# Patient Record
Sex: Female | Born: 1970 | Race: White | Hispanic: No | Smoking: Current some day smoker
Health system: Southern US, Community
[De-identification: ages and names within clinical notes are randomized; demographics above are authoritative.]

## PROBLEM LIST (undated history)

## (undated) DIAGNOSIS — Z803 Family history of malignant neoplasm of breast: Secondary | ICD-10-CM

## (undated) DIAGNOSIS — K219 Gastro-esophageal reflux disease without esophagitis: Secondary | ICD-10-CM

## (undated) DIAGNOSIS — F419 Anxiety disorder, unspecified: Secondary | ICD-10-CM

## (undated) DIAGNOSIS — Z8042 Family history of malignant neoplasm of prostate: Secondary | ICD-10-CM

## (undated) DIAGNOSIS — I10 Essential (primary) hypertension: Secondary | ICD-10-CM

## (undated) DIAGNOSIS — Z923 Personal history of irradiation: Secondary | ICD-10-CM

## (undated) DIAGNOSIS — Z8489 Family history of other specified conditions: Secondary | ICD-10-CM

## (undated) DIAGNOSIS — Z806 Family history of leukemia: Secondary | ICD-10-CM

## (undated) HISTORY — PX: LEEP: SHX91

## (undated) HISTORY — DX: Family history of malignant neoplasm of breast: Z80.3

## (undated) HISTORY — DX: Essential (primary) hypertension: I10

## (undated) HISTORY — DX: Family history of malignant neoplasm of prostate: Z80.42

## (undated) HISTORY — DX: Gastro-esophageal reflux disease without esophagitis: K21.9

## (undated) HISTORY — DX: Anxiety disorder, unspecified: F41.9

## (undated) HISTORY — DX: Family history of leukemia: Z80.6

---

## 2006-11-29 ENCOUNTER — Ambulatory Visit: Payer: Self-pay | Admitting: Family Medicine

## 2009-07-03 ENCOUNTER — Emergency Department (HOSPITAL_COMMUNITY): Admission: EM | Admit: 2009-07-03 | Discharge: 2009-07-03 | Payer: Self-pay | Admitting: Emergency Medicine

## 2012-04-19 LAB — HM MAMMOGRAPHY

## 2012-10-13 ENCOUNTER — Encounter (HOSPITAL_COMMUNITY): Payer: Self-pay

## 2012-10-13 ENCOUNTER — Emergency Department (HOSPITAL_COMMUNITY): Payer: Medicaid Other

## 2012-10-13 ENCOUNTER — Emergency Department (HOSPITAL_COMMUNITY)
Admission: EM | Admit: 2012-10-13 | Discharge: 2012-10-13 | Disposition: A | Discharge status: Home / Self Care | Payer: Medicaid Other | Attending: Emergency Medicine | Admitting: Emergency Medicine

## 2012-10-13 DIAGNOSIS — Z7982 Long term (current) use of aspirin: Secondary | ICD-10-CM | POA: Insufficient documentation

## 2012-10-13 DIAGNOSIS — Z79899 Other long term (current) drug therapy: Secondary | ICD-10-CM | POA: Insufficient documentation

## 2012-10-13 DIAGNOSIS — M545 Low back pain, unspecified: Secondary | ICD-10-CM | POA: Insufficient documentation

## 2012-10-13 DIAGNOSIS — F17200 Nicotine dependence, unspecified, uncomplicated: Secondary | ICD-10-CM | POA: Insufficient documentation

## 2012-10-13 MED ORDER — IBUPROFEN 800 MG PO TABS
800.0000 mg | ORAL_TABLET | Freq: Once | ORAL | Status: AC
  Administered 2012-10-13: 800 mg via ORAL
  Filled 2012-10-13: qty 1

## 2012-10-13 MED ORDER — HYDROCODONE-ACETAMINOPHEN 5-325 MG PO TABS: 1.0000 | ORAL_TABLET | Freq: Four times a day (QID) | ORAL | Status: AC | PRN

## 2012-10-13 MED ORDER — HYDROCODONE-ACETAMINOPHEN 5-325 MG PO TABS
1.0000 | ORAL_TABLET | Freq: Once | ORAL | Status: AC
  Administered 2012-10-13: 1 via ORAL
  Filled 2012-10-13: qty 1

## 2012-10-13 NOTE — ED Provider Notes (Signed)
Medical screening examination/treatment/procedure(s) were performed by non-physician practitioner and as supervising physician I was immediately available for consultation/collaboration.  Melrose Kearse M Gia Lusher, MD 10/13/12 2048 

## 2012-10-13 NOTE — ED Notes (Signed)
Pt presents with lower back pain that has worsened over past 3 days. Pt denies loss of bladder and bowel function. Pt denies injury at this time. NAD noted

## 2012-10-13 NOTE — ED Provider Notes (Signed)
History     CSN: [id number]  Arrival date & time 10/13/12  1151   First MD Initiated Contact with Patient 10/13/12 1228      Chief Complaint  Patient presents with  . Back Pain    (Consider location/radiation/quality/duration/timing/severity/associated sxs/prior treatment) HPI Comments: No known injury to back.  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  Patient is a 41 y.o. female presenting with back pain. The history is provided by the patient. No language interpreter was used.  Back Pain  This is a chronic problem. The problem occurs constantly. The problem has not changed since onset.The pain is associated with no known injury. The pain is present in the lumbar spine. The symptoms are aggravated by bending and twisting. The pain is the same all the time. Pertinent negatives include no fever, no numbness, no bowel incontinence, no perianal numbness, no bladder incontinence, no dysuria, no pelvic pain, no leg pain, no paresthesias, no paresis, no tingling and no weakness. She has tried muscle relaxants and NSAIDs for the symptoms. The treatment provided no relief.    History reviewed. No pertinent past medical history.  History reviewed. No pertinent past surgical history.  No family history on file.  History  Substance Use Topics  . Smoking status: Current Every Day Smoker  . Smokeless tobacco: Not on file  . Alcohol Use: No    OB History    Grav Para Term Preterm Abortions TAB SAB Ect Mult Living                  Review of Systems  Constitutional: Negative for fever.  Gastrointestinal: Negative for bowel incontinence.  Genitourinary: Negative for bladder incontinence, dysuria and pelvic pain.  Musculoskeletal: Positive for back pain.  Neurological: Negative for tingling, weakness, numbness and paresthesias.  All other systems reviewed and are negative.    Allergies  Review of patient's allergies indicates no known  allergies.  Home Medications   Current Outpatient Rx  Name  Route  Sig  Dispense  Refill  . ACETAMINOPHEN 500 MG PO TABS   Oral   Take 1,000 mg by mouth every 6 (six) hours as needed. pain         . ALPRAZOLAM 1 MG PO TABS   Oral   Take 1 mg by mouth 4 (four) times daily.         Marlin Canary HEADACHE PO   Oral   Take 1 packet by mouth every 12 (twelve) hours as needed. headache         . ENALAPRIL MALEATE 5 MG PO TABS   Oral   Take 5 mg by mouth daily.         . IBUPROFEN 200 MG PO TABS   Oral   Take 400 mg by mouth every 8 (eight) hours as needed. Back pain         . METHOCARBAMOL 500 MG PO TABS   Oral   Take 500 mg by mouth daily.         Marland Kitchen OMEPRAZOLE 20 MG PO CPDR   Oral   Take 20 mg by mouth daily.         Marland Kitchen PAROXETINE HCL 10 MG PO TABS   Oral   Take 10 mg by mouth every morning.         . CYCLOBENZAPRINE HCL 10 MG PO TABS   Oral   Take 10 mg by mouth 3 (three) times daily.  BP 105/71  Pulse 95  Temp 97.8 F (36.6 C) (Oral)  Resp 18  Ht 5\' 6"  (1.676 m)  Wt 140 lb (63.504 kg)  BMI 22.60 kg/m2  SpO2 98%  LMP 10/12/2012  Physical Exam  Nursing note and vitals reviewed. Constitutional: She is oriented to person, place, and time. She appears well-developed and well-nourished. No distress.  HENT:  Head: Normocephalic and atraumatic.  Eyes: EOM are normal.  Neck: Normal range of motion.  Cardiovascular: Normal rate, regular rhythm and normal heart sounds.   Pulmonary/Chest: Effort normal and breath sounds normal.  Abdominal: Soft. She exhibits no distension. There is no tenderness.  Musculoskeletal: She exhibits tenderness.       Lumbar back: She exhibits decreased range of motion, tenderness, bony tenderness and pain. She exhibits no deformity and normal pulse.       Back:  Neurological: She is alert and oriented to person, place, and time.  Skin: Skin is warm and dry.  Psychiatric: She has a normal mood and affect. Judgment  normal.    ED Course  Procedures (including critical care time)  Labs Reviewed - No data to display Dg Lumbar Spine Complete  10/13/2012  *RADIOLOGY REPORT*  Clinical Data: Low back pain.  LUMBAR SPINE - COMPLETE 4+ VIEW  Comparison: None.  Findings: There is narrowing of the L5-S1 disc space with sclerosis and osteophytes extending from the endplates.  The remainder of the lumbar spine appears normal.  No facet arthritis.  No fractures.  IMPRESSION: Degenerative disc disease at L5-S1.   Original Report Authenticated By: Francene Boyers, M.D.      1. Lumbar back pain       MDM  rx-hydrocodone, 20 Ice F/u with PCP        [name], PA 10/13/12 1437

## 2012-10-13 NOTE — ED Notes (Signed)
Pt c/o pain in lower back since Friday.  Denies any recent injury.  Reports chronic back pain.  Denies any pain radiating down her legs

## 2013-08-05 ENCOUNTER — Ambulatory Visit (INDEPENDENT_AMBULATORY_CARE_PROVIDER_SITE_OTHER): Payer: PRIVATE HEALTH INSURANCE | Admitting: Family Medicine

## 2013-08-05 ENCOUNTER — Encounter (INDEPENDENT_AMBULATORY_CARE_PROVIDER_SITE_OTHER): Payer: Self-pay

## 2013-08-05 VITALS — BP 114/79 | HR 82 | Temp 97.9°F | Ht 65.5 in | Wt 137.6 lb

## 2013-08-05 DIAGNOSIS — Z0000 Encounter for general adult medical examination without abnormal findings: Secondary | ICD-10-CM

## 2013-08-05 DIAGNOSIS — Z23 Encounter for immunization: Secondary | ICD-10-CM

## 2013-08-05 LAB — POCT CBC
Granulocyte percent: 68.9 %G (ref 37–80)
HCT, POC: 43.6 % (ref 37.7–47.9)
Hemoglobin: 14.3 g/dL (ref 12.2–16.2)
Lymph, poc: 1.7 (ref 0.6–3.4)
MCH, POC: 31.5 pg — AB (ref 27–31.2)
MCHC: 32.9 g/dL (ref 31.8–35.4)
MCV: 95.6 fL (ref 80–97)
MPV: 7.6 fL (ref 0–99.8)
POC Granulocyte: 4.3 (ref 2–6.9)
POC LYMPH PERCENT: 27.7 %L (ref 10–50)
Platelet Count, POC: 270 10*3/uL (ref 142–424)
RBC: 4.6 M/uL (ref 4.04–5.48)
RDW, POC: 13.1 %
WBC: 6.3 10*3/uL (ref 4.6–10.2)

## 2013-08-05 NOTE — Progress Notes (Signed)
  Subjective:    Patient ID: Doris Newman, female    DOB: [birth year], 42 y.o.   MRN: [id number]  HPI This 42 y.o. female presents for evaluation of exam.  She is seeing female Provider for women's health.  She has hx of smoking cigarettes.  She  Is doing fine today and denies any health problems.   Review of Systems No chest pain, SOB, HA, dizziness, vision change, N/V, diarrhea, constipation, dysuria, urinary urgency or frequency, myalgias, arthralgias or rash.     Objective:   Physical Exam Vital signs noted  Well developed well nourished Female.  HEENT - Head atraumatic Normocephalic                Eyes - PERRLA, Conjuctiva - clear Sclera- Clear EOMI                Ears - EAC's Wnl TM's Wnl Gross Hearing WNL                Nose - Nares patent                 Throat - oropharanx wnl Respiratory - Lungs CTA bilateral Cardiac - RRR S1 and S2 without murmur GI - Abdomen soft Nontender and bowel sounds active x 4 Extremities - No edema. Neuro - Grossly intact.       Assessment & Plan:  Routine general medical examination at a health care facility - Plan: POCT CBC, CMP14+EGFR, Lipid panel, Thyroid Panel With TSH, Vit D  25 hydroxy (rtn osteoporosis monitoring)  Tobacco abuse - Discussed DC smoking  Deatra Canter FNP f

## 2013-08-05 NOTE — Patient Instructions (Signed)
Smoking Cessation Quitting smoking is important to your health and has many advantages. However, it is not always easy to quit since nicotine is a very addictive drug. Often times, people try 3 times or more before being able to quit. This document explains the best ways for you to prepare to quit smoking. Quitting takes hard work and a lot of effort, but you can do it. ADVANTAGES OF QUITTING SMOKING  You will live longer, feel better, and live better.  Your body will feel the impact of quitting smoking almost immediately.  Within 20 minutes, blood pressure decreases. Your pulse returns to its normal level.  After 8 hours, carbon monoxide levels in the blood return to normal. Your oxygen level increases.  After 24 hours, the chance of having a heart attack starts to decrease. Your breath, hair, and body stop smelling like smoke.  After 48 hours, damaged nerve endings begin to recover. Your sense of taste and smell improve.  After 72 hours, the body is virtually free of nicotine. Your bronchial tubes relax and breathing becomes easier.  After 2 to 12 weeks, lungs can hold more air. Exercise becomes easier and circulation improves.  The risk of having a heart attack, stroke, cancer, or lung disease is greatly reduced.  After 1 year, the risk of coronary heart disease is cut in half.  After 5 years, the risk of stroke falls to the same as a nonsmoker.  After 10 years, the risk of lung cancer is cut in half and the risk of other cancers decreases significantly.  After 15 years, the risk of coronary heart disease drops, usually to the level of a nonsmoker.  If you are pregnant, quitting smoking will improve your chances of having a healthy baby.  The people you live with, especially any children, will be healthier.  You will have extra money to spend on things other than cigarettes. QUESTIONS TO THINK ABOUT BEFORE ATTEMPTING TO QUIT You may want to talk about your answers with your  caregiver.  Why do you want to quit?  If you tried to quit in the past, what helped and what did not?  What will be the most difficult situations for you after you quit? How will you plan to handle them?  Who can help you through the tough times? Your family? Friends? A caregiver?  What pleasures do you get from smoking? What ways can you still get pleasure if you quit? Here are some questions to ask your caregiver:  How can you help me to be successful at quitting?  What medicine do you think would be best for me and how should I take it?  What should I do if I need more help?  What is smoking withdrawal like? How can I get information on withdrawal? GET READY  Set a quit date.  Change your environment by getting rid of all cigarettes, ashtrays, matches, and lighters in your home, car, or work. Do not let people smoke in your home.  Review your past attempts to quit. Think about what worked and what did not. GET SUPPORT AND ENCOURAGEMENT You have a better chance of being successful if you have help. You can get support in many ways.  Tell your family, friends, and co-workers that you are going to quit and need their support. Ask them not to smoke around you.  Get individual, group, or telephone counseling and support. Programs are available at local hospitals and health centers. Call your local health department for   information about programs in your area.  Spiritual beliefs and practices may help some smokers quit.  Download a "quit meter" on your computer to keep track of quit statistics, such as how long you have gone without smoking, cigarettes not smoked, and money saved.  Get a self-help book about quitting smoking and staying off of tobacco. LEARN NEW SKILLS AND BEHAVIORS  Distract yourself from urges to smoke. Talk to someone, go for a walk, or occupy your time with a task.  Change your normal routine. Take a different route to work. Drink tea instead of coffee.  Eat breakfast in a different place.  Reduce your stress. Take a hot bath, exercise, or read a book.  Plan something enjoyable to do every day. Reward yourself for not smoking.  Explore interactive web-based programs that specialize in helping you quit. GET MEDICINE AND USE IT CORRECTLY Medicines can help you stop smoking and decrease the urge to smoke. Combining medicine with the above behavioral methods and support can greatly increase your chances of successfully quitting smoking.  Nicotine replacement therapy helps deliver nicotine to your body without the negative effects and risks of smoking. Nicotine replacement therapy includes nicotine gum, lozenges, inhalers, nasal sprays, and skin patches. Some may be available over-the-counter and others require a prescription.  Antidepressant medicine helps people abstain from smoking, but how this works is unknown. This medicine is available by prescription.  Nicotinic receptor partial agonist medicine simulates the effect of nicotine in your brain. This medicine is available by prescription. Ask your caregiver for advice about which medicines to use and how to use them based on your health history. Your caregiver will tell you what side effects to look out for if you choose to be on a medicine or therapy. Carefully read the information on the package. Do not use any other product containing nicotine while using a nicotine replacement product.  RELAPSE OR DIFFICULT SITUATIONS Most relapses occur within the first 3 months after quitting. Do not be discouraged if you start smoking again. Remember, most people try several times before finally quitting. You may have symptoms of withdrawal because your body is used to nicotine. You may crave cigarettes, be irritable, feel very hungry, cough often, get headaches, or have difficulty concentrating. The withdrawal symptoms are only temporary. They are strongest when you first quit, but they will go away within  10 14 days. To reduce the chances of relapse, try to:  Avoid drinking alcohol. Drinking lowers your chances of successfully quitting.  Reduce the amount of caffeine you consume. Once you quit smoking, the amount of caffeine in your body increases and can give you symptoms, such as a rapid heartbeat, sweating, and anxiety.  Avoid smokers because they can make you want to smoke.  Do not let weight gain distract you. Many smokers will gain weight when they quit, usually less than 10 pounds. Eat a healthy diet and stay active. You can always lose the weight gained after you quit.  Find ways to improve your mood other than smoking. FOR MORE INFORMATION  www.smokefree.gov  Document Released: 09/26/2001 Document Revised: 04/02/2012 Document Reviewed: 01/11/2012 Exit[employer] Patient Information 2014 [employer].  

## 2013-08-06 LAB — VITAMIN D 25 HYDROXY (VIT D DEFICIENCY, FRACTURES): Vit D, 25-Hydroxy: 42.1 ng/mL (ref 30.0–100.0)

## 2013-08-06 LAB — LIPID PANEL
Chol/HDL Ratio: 2.3 ratio units (ref 0.0–4.4)
Cholesterol, Total: 187 mg/dL (ref 100–199)
HDL: 80 mg/dL (ref >39)
LDL Calculated: 93 mg/dL (ref 0–99)
Triglycerides: 71 mg/dL (ref 0–149)
VLDL Cholesterol Cal: 14 mg/dL (ref 5–40)

## 2013-08-06 LAB — CMP14+EGFR
ALT: 14 IU/L (ref 0–32)
AST: 17 IU/L (ref 0–40)
Albumin/Globulin Ratio: 2.3 (ref 1.1–2.5)
Albumin: 4.5 g/dL (ref 3.5–5.5)
Alkaline Phosphatase: 62 IU/L (ref 39–117)
BUN/Creatinine Ratio: 15 (ref 9–23)
BUN: 14 mg/dL (ref 6–24)
CO2: 25 mmol/L (ref 18–29)
Calcium: 9.2 mg/dL (ref 8.7–10.2)
Chloride: 100 mmol/L (ref 97–108)
Creatinine, Ser: 0.95 mg/dL (ref 0.57–1.00)
GFR calc Af Amer: 85 mL/min/{1.73_m2} (ref >59)
GFR calc non Af Amer: 74 mL/min/{1.73_m2} (ref >59)
Globulin, Total: 2 g/dL (ref 1.5–4.5)
Glucose: 85 mg/dL (ref 65–99)
Potassium: 4.7 mmol/L (ref 3.5–5.2)
Sodium: 139 mmol/L (ref 134–144)
Total Bilirubin: 0.5 mg/dL (ref 0.0–1.2)
Total Protein: 6.5 g/dL (ref 6.0–8.5)

## 2013-08-06 LAB — THYROID PANEL WITH TSH
Free Thyroxine Index: 1.5 (ref 1.2–4.9)
T3 Uptake Ratio: 34 % (ref 24–39)
T4, Total: 4.3 ug/dL — ABNORMAL LOW (ref 4.5–12.0)
TSH: 0.943 u[IU]/mL (ref 0.450–4.500)

## 2014-08-21 ENCOUNTER — Telehealth: Payer: Self-pay | Admitting: Family Medicine

## 2014-08-24 NOTE — Telephone Encounter (Signed)
Appt chngd from Wed to Thurs due to provider coverage

## 2014-08-26 ENCOUNTER — Encounter: Payer: PRIVATE HEALTH INSURANCE | Admitting: Family Medicine

## 2014-08-27 ENCOUNTER — Ambulatory Visit (INDEPENDENT_AMBULATORY_CARE_PROVIDER_SITE_OTHER): Payer: 59 | Admitting: *Deleted

## 2014-08-27 ENCOUNTER — Encounter: Payer: Self-pay | Admitting: Family Medicine

## 2014-08-27 ENCOUNTER — Encounter (INDEPENDENT_AMBULATORY_CARE_PROVIDER_SITE_OTHER): Payer: Self-pay

## 2014-08-27 ENCOUNTER — Ambulatory Visit (INDEPENDENT_AMBULATORY_CARE_PROVIDER_SITE_OTHER): Payer: 59 | Admitting: Family Medicine

## 2014-08-27 VITALS — BP 130/86 | HR 87 | Temp 98.2°F | Ht 65.5 in | Wt 156.6 lb

## 2014-08-27 DIAGNOSIS — Z23 Encounter for immunization: Secondary | ICD-10-CM

## 2014-08-27 DIAGNOSIS — Z0000 Encounter for general adult medical examination without abnormal findings: Secondary | ICD-10-CM

## 2014-08-27 LAB — POCT CBC
Granulocyte percent: 66.1 %G (ref 37–80)
HCT, POC: 43.4 % (ref 37.7–47.9)
Hemoglobin: 14 g/dL (ref 12.2–16.2)
Lymph, poc: 1.7 (ref 0.6–3.4)
MCH, POC: 30.5 pg (ref 27–31.2)
MCHC: 32.2 g/dL (ref 31.8–35.4)
MCV: 94.8 fL (ref 80–97)
MPV: 7 fL (ref 0–99.8)
POC Granulocyte: 3.8 (ref 2–6.9)
POC LYMPH PERCENT: 29.8 %L (ref 10–50)
Platelet Count, POC: 274 10*3/uL (ref 142–424)
RBC: 4.6 M/uL (ref 4.04–5.48)
RDW, POC: 13.1 %
WBC: 5.8 10*3/uL (ref 4.6–10.2)

## 2014-08-27 NOTE — Progress Notes (Signed)
   Subjective:    Patient ID: Doris Newman, female    DOB: 1971/07/05, 43 y.o.   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  She has no acute complaints.    Review of Systems  Constitutional: Negative for fever.  HENT: Negative for ear pain.   Eyes: Negative for discharge.  Respiratory: Negative for cough.   Cardiovascular: Negative for chest pain.  Gastrointestinal: Negative for abdominal distention.  Endocrine: Negative for polyuria.  Genitourinary: Negative for difficulty urinating.  Musculoskeletal: Negative for gait problem and neck pain.  Skin: Negative for color change and rash.  Neurological: Negative for speech difficulty and headaches.  Psychiatric/Behavioral: Negative for agitation.       Objective:    There were no vitals taken for this visit. Physical Exam  Constitutional: She is oriented to person, place, and time. She appears well-developed and well-nourished.  HENT:  Head: Normocephalic and atraumatic.  Mouth/Throat: Oropharynx is clear and moist.  Eyes: Pupils are equal, round, and reactive to light.  Neck: Normal range of motion. Neck supple.  Cardiovascular: Normal rate and regular rhythm.   No murmur heard. Pulmonary/Chest: Effort normal and breath sounds normal.  Abdominal: Soft. Bowel sounds are normal. There is no tenderness.  Neurological: She is alert and oriented to person, place, and time.  Skin: Skin is warm and dry.  Psychiatric: She has a normal mood and affect.          Assessment & Plan:     ICD-9-CM ICD-10-CM   1. Routine general medical examination at a health care facility V70.0 Z00.00 POCT CBC     CMP14+EGFR     Lipid panel     Thyroid Panel With TSH     Return if symptoms worsen or fail to improve.  Lysbeth Penner FNP

## 2014-08-28 ENCOUNTER — Telehealth: Payer: Self-pay | Admitting: Family Medicine

## 2014-08-28 LAB — THYROID PANEL WITH TSH
Free Thyroxine Index: 1.5 (ref 1.2–4.9)
T3 Uptake Ratio: 30 % (ref 24–39)
T4, Total: 5 ug/dL (ref 4.5–12.0)
TSH: 0.947 u[IU]/mL (ref 0.450–4.500)

## 2014-08-28 LAB — CMP14+EGFR
ALT: 16 IU/L (ref 0–32)
AST: 22 IU/L (ref 0–40)
Albumin/Globulin Ratio: 2 (ref 1.1–2.5)
Albumin: 4.6 g/dL (ref 3.5–5.5)
Alkaline Phosphatase: 71 IU/L (ref 39–117)
BUN/Creatinine Ratio: 14 (ref 9–23)
BUN: 12 mg/dL (ref 6–24)
CO2: 21 mmol/L (ref 18–29)
Calcium: 9.3 mg/dL (ref 8.7–10.2)
Chloride: 95 mmol/L — ABNORMAL LOW (ref 97–108)
Creatinine, Ser: 0.86 mg/dL (ref 0.57–1.00)
GFR calc Af Amer: 96 mL/min/{1.73_m2} (ref >59)
GFR calc non Af Amer: 83 mL/min/{1.73_m2} (ref >59)
Globulin, Total: 2.3 g/dL (ref 1.5–4.5)
Glucose: 80 mg/dL (ref 65–99)
Potassium: 4.3 mmol/L (ref 3.5–5.2)
Sodium: 132 mmol/L — ABNORMAL LOW (ref 134–144)
Total Bilirubin: 0.4 mg/dL (ref 0.0–1.2)
Total Protein: 6.9 g/dL (ref 6.0–8.5)

## 2014-08-28 LAB — LIPID PANEL
Chol/HDL Ratio: 2.2 ratio units (ref 0.0–4.4)
Cholesterol, Total: 203 mg/dL — ABNORMAL HIGH (ref 100–199)
HDL: 94 mg/dL (ref >39)
LDL Calculated: 97 mg/dL (ref 0–99)
Triglycerides: 61 mg/dL (ref 0–149)
VLDL Cholesterol Cal: 12 mg/dL (ref 5–40)

## 2014-08-28 NOTE — Telephone Encounter (Signed)
-----   Message from Lysbeth Penner, FNP sent at 08/28/2014 10:36 AM EST ----- Labs ok

## 2015-10-25 ENCOUNTER — Encounter: Payer: Self-pay | Admitting: Family Medicine

## 2015-10-25 ENCOUNTER — Ambulatory Visit (INDEPENDENT_AMBULATORY_CARE_PROVIDER_SITE_OTHER): Payer: BLUE CROSS/BLUE SHIELD | Admitting: Family Medicine

## 2015-10-25 VITALS — BP 121/86 | HR 73 | Temp 98.8°F | Ht 65.5 in | Wt 163.6 lb

## 2015-10-25 DIAGNOSIS — J0100 Acute maxillary sinusitis, unspecified: Secondary | ICD-10-CM

## 2015-10-25 MED ORDER — AMOXICILLIN-POT CLAVULANATE 875-125 MG PO TABS: 1.0000 | ORAL_TABLET | Freq: Two times a day (BID) | ORAL | Status: DC

## 2015-10-25 NOTE — Progress Notes (Signed)
   Subjective:    Patient ID: Doris Newman, female    DOB: 02/02/1971, 45 y.o.   MRN: [id number]  HPI 45 year old female with chief complaint of head congestion drainage and lack of energy. The lack of energy may be acute as well as chronic. There is a history of depression and she currently takes citalopram 30 mg with the Wellbutrin having been stopped her psychiatrist. She was out of work yesterday and needs a note  There are no active problems to display for this patient.  Outpatient Encounter Prescriptions as of 10/25/2015  Medication Sig  . acetaminophen (TYLENOL) 500 MG tablet Take 1,000 mg by mouth every 6 (six) hours as needed. pain  . ALPRAZolam (XANAX) 1 MG tablet Take 1 mg by mouth 4 (four) times daily.  . citalopram (CELEXA) 20 MG tablet Take 20 mg by mouth daily.  . cyclobenzaprine (FLEXERIL) 10 MG tablet Take 10 mg by mouth 3 (three) times daily.  . enalapril (VASOTEC) 10 MG tablet   . ibuprofen (ADVIL,MOTRIN) 200 MG tablet Take 400 mg by mouth every 8 (eight) hours as needed. Back pain  . omeprazole (PRILOSEC) 20 MG capsule Take 20 mg by mouth daily.  . Aspirin-Acetaminophen-Caffeine (GOODY HEADACHE PO) Take 1 packet by mouth every 12 (twelve) hours as needed. headache  . [DISCONTINUED] buPROPion (WELLBUTRIN XL) 150 MG 24 hr tablet Take 300 mg by mouth daily.   . [DISCONTINUED] NORA-BE 0.35 MG tablet   . [DISCONTINUED] prazosin (MINIPRESS) 2 MG capsule Take 2 mg by mouth at bedtime.   No facility-administered encounter medications on file as of 10/25/2015.      Review of Systems  Constitutional: Negative.   HENT: Positive for congestion and postnasal drip.   Respiratory: Negative.   Cardiovascular: Negative.   Neurological: Negative.   Psychiatric/Behavioral: Negative.        Objective:   Physical Exam  Constitutional: She is oriented to person, place, and time. She appears well-developed and well-nourished.  HENT:  Head: Normocephalic.  Mouth/Throat:  Oropharynx is clear and moist.  Tenderness in maxillary sinuses to percussion  Cardiovascular: Normal rate and regular rhythm.   Pulmonary/Chest: Effort normal and breath sounds normal.  Neurological: She is alert and oriented to person, place, and time.  Psychiatric: She has a normal mood and affect. Her behavior is normal.          Assessment & Plan:  1. Acute maxillary sinusitis, recurrence not specified We'll treat with Augmentin and Mucinex D. Troponin fluids. Spend extra minutes and hot shower.  Wardell Honour MD

## 2015-12-06 ENCOUNTER — Other Ambulatory Visit (INDEPENDENT_AMBULATORY_CARE_PROVIDER_SITE_OTHER): Payer: BLUE CROSS/BLUE SHIELD

## 2015-12-06 DIAGNOSIS — R5382 Chronic fatigue, unspecified: Secondary | ICD-10-CM | POA: Diagnosis not present

## 2015-12-06 DIAGNOSIS — R635 Abnormal weight gain: Secondary | ICD-10-CM

## 2015-12-07 LAB — CMP14+EGFR
ALT: 15 IU/L (ref 0–32)
AST: 20 IU/L (ref 0–40)
Albumin/Globulin Ratio: 2.1 (ref 1.1–2.5)
Albumin: 4.5 g/dL (ref 3.5–5.5)
Alkaline Phosphatase: 65 IU/L (ref 39–117)
BUN/Creatinine Ratio: 13 (ref 9–23)
BUN: 11 mg/dL (ref 6–24)
Bilirubin Total: 0.6 mg/dL (ref 0.0–1.2)
CO2: 21 mmol/L (ref 18–29)
Calcium: 9.3 mg/dL (ref 8.7–10.2)
Chloride: 98 mmol/L (ref 96–106)
Creatinine, Ser: 0.85 mg/dL (ref 0.57–1.00)
GFR calc Af Amer: 96 mL/min/1.73 (ref >59)
GFR calc non Af Amer: 84 mL/min/1.73 (ref >59)
Globulin, Total: 2.1 g/dL (ref 1.5–4.5)
Glucose: 97 mg/dL (ref 65–99)
Potassium: 4.7 mmol/L (ref 3.5–5.2)
Sodium: 134 mmol/L (ref 134–144)
Total Protein: 6.6 g/dL (ref 6.0–8.5)

## 2015-12-07 LAB — CBC WITH DIFFERENTIAL/PLATELET
Basophils Absolute: 0 x10E3/uL (ref 0.0–0.2)
Basos: 0 %
EOS (ABSOLUTE): 0.2 x10E3/uL (ref 0.0–0.4)
Eos: 4 %
Hematocrit: 40.1 % (ref 34.0–46.6)
Hemoglobin: 13.8 g/dL (ref 11.1–15.9)
Immature Grans (Abs): 0 x10E3/uL (ref 0.0–0.1)
Immature Granulocytes: 0 %
Lymphocytes Absolute: 1.5 x10E3/uL (ref 0.7–3.1)
Lymphs: 29 %
MCH: 32.4 pg (ref 26.6–33.0)
MCHC: 34.4 g/dL (ref 31.5–35.7)
MCV: 94 fL (ref 79–97)
Monocytes Absolute: 0.5 x10E3/uL (ref 0.1–0.9)
Monocytes: 10 %
Neutrophils Absolute: 2.8 x10E3/uL (ref 1.4–7.0)
Neutrophils: 57 %
Platelets: 315 x10E3/uL (ref 150–379)
RBC: 4.26 x10E6/uL (ref 3.77–5.28)
RDW: 13.9 % (ref 12.3–15.4)
WBC: 5 x10E3/uL (ref 3.4–10.8)

## 2015-12-07 LAB — THYROID PANEL WITH TSH
FREE THYROXINE INDEX: 1.5 (ref 1.2–4.9)
T3 Uptake Ratio: 30 % (ref 24–39)
T4 TOTAL: 5.1 ug/dL (ref 4.5–12.0)
TSH: 1.46 u[IU]/mL (ref 0.450–4.500)

## 2015-12-07 LAB — VITAMIN D 25 HYDROXY (VIT D DEFICIENCY, FRACTURES): Vit D, 25-Hydroxy: 22.2 ng/mL — ABNORMAL LOW (ref 30.0–100.0)

## 2015-12-07 LAB — VITAMIN B12: VITAMIN B 12: 156 pg/mL — AB (ref 211–946)

## 2015-12-07 LAB — FERRITIN: Ferritin: 48 ng/mL (ref 15–150)

## 2016-01-05 ENCOUNTER — Encounter: Payer: Self-pay | Admitting: Pediatrics

## 2016-01-05 ENCOUNTER — Ambulatory Visit (INDEPENDENT_AMBULATORY_CARE_PROVIDER_SITE_OTHER): Payer: BLUE CROSS/BLUE SHIELD | Admitting: Pediatrics

## 2016-01-05 VITALS — BP 120/88 | HR 79 | Temp 97.5°F | Ht 65.5 in | Wt 163.0 lb

## 2016-01-05 DIAGNOSIS — R6889 Other general symptoms and signs: Secondary | ICD-10-CM | POA: Diagnosis not present

## 2016-01-05 LAB — VERITOR FLU A/B WAIVED
INFLUENZA A: NEGATIVE
INFLUENZA B: NEGATIVE

## 2016-01-05 MED ORDER — ONDANSETRON 4 MG PO TBDP: 4.0000 mg | ORAL_TABLET | Freq: Three times a day (TID) | ORAL | Status: DC | PRN

## 2016-01-05 NOTE — Progress Notes (Signed)
    Subjective:    Patient ID: Doris Newman, female    DOB: 06/17/1971, 45 y.o.   MRN: [id number]  CC: Headache; Nausea; and Emesis   HPI: Doris Newman is a 45 y.o. female presenting for Headache; Nausea; and Emesis  Works at [employer], nauseated starting the morning. Sore throat Started over night last night No known flu contacts Emesis a couple times this morning Some muscle aches    Depression screen [facility] 2/9 01/05/2016 08/27/2014  Decreased Interest 0 0  Down, Depressed, Hopeless 0 0  PHQ - 2 Score 0 0     Relevant past medical, surgical, family and social history reviewed and updated as indicated. Interim medical history since our last visit reviewed. Allergies and medications reviewed and updated.    ROS: Per HPI unless specifically indicated above  History  Smoking status  . Current Every Day Smoker  Smokeless tobacco  . Not on file      Objective:    BP 120/88 mmHg  Pulse 79  Temp(Src) 97.5 F (36.4 C) (Oral)  Ht 5' 5.5" (1.664 m)  Wt 163 lb (73.936 kg)  BMI 26.70 kg/m2  Wt Readings from Last 3 Encounters:  01/05/16 163 lb (73.936 kg)  10/25/15 163 lb 9.6 oz (74.208 kg)  08/27/14 156 lb 9.6 oz (71.033 kg)     Gen: NAD, alert, cooperative with exam, NCAT EYES: EOMI, no scleral injection or icterus ENT:  TMs pearly gray b/l, OP without erythema CV: NRRR, normal S1/S2, no murmur, distal pulses 2+ b/l Resp: CTABL, no wheezes, normal WOB Abd: +BS, soft, NTND. no guarding or organomegaly Ext: No edema, warm Neuro: Alert and oriented, strength equal b/l UE and LE, coordination grossly normal MSK: normal muscle bulk     Assessment & Plan:    Doris Newman was seen today for headache, nausea and emesis. Flu negative. Likely from virus, started this morning. Discussed symptomatic care, return precautions. Abd exam benign.  Diagnoses and all orders for this visit:  Flu-like symptoms -     Veritor Flu A/B Waived -     ondansetron  (ZOFRAN-ODT) 4 MG disintegrating tablet; Take 1 tablet (4 mg total) by mouth every 8 (eight) hours as needed for nausea or vomiting.    Follow up plan: Return if symptoms worsen or fail to improve.  Doris Found, MD [facility] 01/05/2016, 2:54 PM

## 2016-01-05 NOTE — Patient Instructions (Signed)
Netipot with distilled water 2-3 times a day to clear out sinuses Or Normal saline nasal spray Flonase steroid nasal spray Ibuprofen 600mg  three times a day Lots of fluids

## 2016-02-15 ENCOUNTER — Ambulatory Visit (INDEPENDENT_AMBULATORY_CARE_PROVIDER_SITE_OTHER): Payer: BLUE CROSS/BLUE SHIELD

## 2016-02-15 ENCOUNTER — Ambulatory Visit (INDEPENDENT_AMBULATORY_CARE_PROVIDER_SITE_OTHER): Payer: BLUE CROSS/BLUE SHIELD | Admitting: Family Medicine

## 2016-02-15 ENCOUNTER — Encounter: Payer: Self-pay | Admitting: Family Medicine

## 2016-02-15 ENCOUNTER — Encounter (INDEPENDENT_AMBULATORY_CARE_PROVIDER_SITE_OTHER): Payer: Self-pay

## 2016-02-15 VITALS — BP 146/102 | HR 91 | Temp 97.9°F | Ht 65.5 in | Wt 166.2 lb

## 2016-02-15 DIAGNOSIS — M25562 Pain in left knee: Secondary | ICD-10-CM

## 2016-02-15 MED ORDER — IBUPROFEN 800 MG PO TABS: 800.0000 mg | ORAL_TABLET | Freq: Three times a day (TID) | ORAL | Status: DC | PRN

## 2016-02-15 NOTE — Patient Instructions (Signed)
Great to meet you!  I think you will begin improving soon, because you have such severe pain and its keeping you out of work I am sending you to orthopedics  Take ibuprofen 3 times a day for 3-4 days Use ice 15 minutes, 4-5 times a day Use the ace bandage for compression, try to take it easy

## 2016-02-15 NOTE — Progress Notes (Signed)
   HPI  Patient presents today here with left knee pain.  Patient explains that she was mowing the grass yesterday, after she finished she slipped on wet grass and landed on her medial left knee. She had immediate pain and pain with walking. She sat and rested for a few minutes and then she was able to walk.  Today she continues to have difficulty flexing the knee completely and medial knee pain. She has no problem bearing weight.  She has no locking or popping symptoms. She has mild swelling. She's tried ointment but no ice. She's also tried 400 mg of ibuprofen  PMH: Smoking status noted ROS: Per HPI  Objective: BP 146/102 mmHg  Pulse 91  Temp(Src) 97.9 F (36.6 C) (Oral)  Ht 5' 5.5" (1.664 m)  Wt 166 lb 3.2 oz (75.388 kg)  BMI 27.23 kg/m2 Gen: NAD, alert, cooperative with exam HEENT: NCAT CV: RRR, good S1/S2, no murmur Resp: CTABL, no wheezes, non-labored Abd: SNTND, BS present, no guarding or organomegaly Ext: No edema, warm Neuro: Alert and oriented, No gross deficits  MSK: Mild effusion on the left knee L knee without erythema, bruising, or gross deformity Small abrasion on upper medial knee Severe medial joint line tenderness, also tenderness with valgus stress ligamentously intact to Lachman's and with varus and valgus stress.  No joint laxity  Limping  Plain film of the left knee today Mild bilateral medial compartment joint space narrowing No acute findings   Assessment and plan:  # Left knee pain After a fall, with effusion Recommended ice, compression, rest, and NSAIDs 3-4 days Considering severe pain and did not send her back to work for her next 2 shifts, also referred her to orthopedics with severe medial joint line tenderness and tenderness with valgus stress, there is at least a possibility of medial collateral ligament injury Return to clinic for any worsening symptoms or pain as expected.      Orders Placed This Encounter  Procedures  .  DG Knee 1-2 Views Left    Standing Status: Future     Number of Occurrences: 1     Standing Expiration Date: 04/16/2017    Order Specific Question:  Reason for Exam (SYMPTOM  OR DIAGNOSIS REQUIRED)    Answer:  knee pain, fall    Order Specific Question:  Is the patient pregnant?    Answer:  No    Order Specific Question:  Preferred imaging location?    Answer:  Internal  . Ambulatory referral to Orthopedic Surgery    Referral Priority:  Urgent    Referral Type:  Surgical    Referral Reason:  Specialty Services Required    Requested Specialty:  Orthopedic Surgery    Number of Visits Requested:  1    Meds ordered this encounter  Medications  . ibuprofen (ADVIL,MOTRIN) 800 MG tablet    Sig: Take 1 tablet (800 mg total) by mouth every 8 (eight) hours as needed.    Dispense:  30 tablet    Refill:  0    Laroy Apple, MD [facility] 02/15/2016, 9:22 AM

## 2016-02-17 ENCOUNTER — Telehealth: Payer: Self-pay

## 2016-02-17 NOTE — Telephone Encounter (Signed)
Cant get into Ortho till 02/28/16  Doesn't know if she'll need it but if so can she get an extension on her [facility] note?

## 2016-02-17 NOTE — Telephone Encounter (Signed)
Yes, but lets see if she needs it prior to writing it.   Laroy Apple, MD [facility] 02/17/2016, 11:32 AM

## 2016-02-29 NOTE — Telephone Encounter (Signed)
Multiple attempts made to contact patient.  This encounter will now be closed  

## 2016-03-08 ENCOUNTER — Telehealth: Payer: Self-pay | Admitting: Family Medicine

## 2016-06-07 DIAGNOSIS — F419 Anxiety disorder, unspecified: Secondary | ICD-10-CM | POA: Diagnosis not present

## 2016-06-07 DIAGNOSIS — I10 Essential (primary) hypertension: Secondary | ICD-10-CM | POA: Diagnosis not present

## 2016-06-07 DIAGNOSIS — Z008 Encounter for other general examination: Secondary | ICD-10-CM | POA: Diagnosis not present

## 2016-06-07 DIAGNOSIS — F17210 Nicotine dependence, cigarettes, uncomplicated: Secondary | ICD-10-CM | POA: Diagnosis not present

## 2016-06-13 ENCOUNTER — Ambulatory Visit (INDEPENDENT_AMBULATORY_CARE_PROVIDER_SITE_OTHER): Payer: BLUE CROSS/BLUE SHIELD | Admitting: Nurse Practitioner

## 2016-06-13 ENCOUNTER — Encounter: Payer: Self-pay | Admitting: Nurse Practitioner

## 2016-06-13 VITALS — BP 126/94 | HR 91 | Temp 98.4°F | Ht 65.0 in | Wt 166.0 lb

## 2016-06-13 DIAGNOSIS — J029 Acute pharyngitis, unspecified: Secondary | ICD-10-CM | POA: Diagnosis not present

## 2016-06-13 LAB — CULTURE, GROUP A STREP

## 2016-06-13 LAB — RAPID STREP SCREEN (MED CTR MEBANE ONLY): STREP GP A AG, IA W/REFLEX: NEGATIVE

## 2016-06-13 NOTE — Patient Instructions (Signed)
Force fluids °Motrin or tylenol OTC °OTC decongestant °Throat lozenges if help °New toothbrush in 3 days ° °

## 2016-06-13 NOTE — Progress Notes (Signed)
Subjective:     Doris Newman is a 45 y.o. female who presents for evaluation of sore throat. Associated symptoms include chest congestion, chest pain during cough, chills, dry cough, headache, hoarseness, nasal blockage, night sweats, pain while swallowing, post nasal drip, sinus and nasal congestion and sore throat. Onset of symptoms was 2 days ago, and have been gradually improving since that time. She has had very poor oral intake. She has not had a recent close exposure to someone with proven streptococcal pharyngitis.  The following portions of the patient's history were reviewed and updated as appropriate: past family history, past surgical history and problem list.  Review of Systems Constitutional: positive for chills and malaise Eyes: negative Ears, nose, mouth, throat, and face: positive for earaches, facial trauma and hearing loss Respiratory: negative for cough and dyspnea on exertion Cardiovascular: negative for chest pain, chest pressure/discomfort, dyspnea and palpitations    Objective:    General appearance: alert Eyes: conjunctivae/corneas clear. PERRL, EOM's intact. Fundi benign. Ears: normal TM's and external ear canals both ears Nose: Nares normal. Septum midline. Mucosa normal. No drainage or sinus tenderness., right turbinate red, left turbinate red, no sinus tenderness Throat: normal findings: lips normal without lesions, palate normal, tongue midline and normal and oropharynx pink & moist without lesions or evidence of thrush Neck: no adenopathy, no carotid bruit, no JVD, supple, symmetrical, trachea midline and thyroid not enlarged, symmetric, no tenderness/mass/nodules Lungs: clear to auscultation bilaterally  Laboratory Strep test done. Results:negative.    Assessment:    Acute pharyngitis, likely  Viral pharyngitis.    Plan:     Force fluids Motrin or tylenol OTC OTC decongestant Throat lozenges if help New toothbrush in 3 days  Mary-Margaret  Hassell Done, FNP

## 2016-06-15 ENCOUNTER — Ambulatory Visit: Payer: BLUE CROSS/BLUE SHIELD | Admitting: Family Medicine

## 2016-06-20 ENCOUNTER — Ambulatory Visit (INDEPENDENT_AMBULATORY_CARE_PROVIDER_SITE_OTHER): Payer: BLUE CROSS/BLUE SHIELD | Admitting: Family Medicine

## 2016-06-20 ENCOUNTER — Encounter: Payer: Self-pay | Admitting: Family Medicine

## 2016-06-20 VITALS — BP 88/57 | HR 80 | Temp 97.3°F | Ht 65.0 in | Wt 165.0 lb

## 2016-06-20 DIAGNOSIS — R5383 Other fatigue: Secondary | ICD-10-CM

## 2016-06-20 DIAGNOSIS — I10 Essential (primary) hypertension: Secondary | ICD-10-CM | POA: Diagnosis not present

## 2016-06-20 DIAGNOSIS — R5381 Other malaise: Secondary | ICD-10-CM | POA: Diagnosis not present

## 2016-06-20 MED ORDER — OMEPRAZOLE 20 MG PO CPDR: 20.0000 mg | DELAYED_RELEASE_CAPSULE | Freq: Every day | ORAL | 0 refills | Status: DC

## 2016-06-20 MED ORDER — ENALAPRIL MALEATE 10 MG PO TABS: 10.0000 mg | ORAL_TABLET | Freq: Every day | ORAL | 0 refills | Status: DC

## 2016-06-20 NOTE — Progress Notes (Signed)
   Subjective:    Patient ID: Doris Newman, female    DOB: 07/30/1971, 45 y.o.   MRN: [id number]  HPI patient is requesting refills of chronic medicine for blood pressure and GERD. These are Vasotec and Prilosec.  There are no active problems to display for this patient.  Outpatient Encounter Prescriptions as of 06/20/2016  Medication Sig  . ALPRAZolam (XANAX) 1 MG tablet Take 1 mg by mouth 4 (four) times daily.  . Aspirin-Acetaminophen-Caffeine (GOODY HEADACHE PO) Take 1 packet by mouth every 12 (twelve) hours as needed. headache  . citalopram (CELEXA) 20 MG tablet Take 20 mg by mouth daily.  . cyclobenzaprine (FLEXERIL) 10 MG tablet Take 10 mg by mouth 3 (three) times daily.  . enalapril (VASOTEC) 10 MG tablet   . ibuprofen (ADVIL,MOTRIN) 800 MG tablet Take 1 tablet (800 mg total) by mouth every 8 (eight) hours as needed.  . norethindrone (MICRONOR,CAMILA,ERRIN) 0.35 MG tablet Take 1 tablet by mouth daily.  Marland Kitchen omeprazole (PRILOSEC) 20 MG capsule Take 20 mg by mouth daily.  . ondansetron (ZOFRAN-ODT) 4 MG disintegrating tablet Take 1 tablet (4 mg total) by mouth every 8 (eight) hours as needed for nausea or vomiting.   No facility-administered encounter medications on file as of 06/20/2016.       Review of Systems  Constitutional: Positive for fatigue.  Respiratory: Negative.   Cardiovascular: Negative.   Gastrointestinal: Negative.   Psychiatric/Behavioral: Negative.        Objective:   Physical Exam  Constitutional: She is oriented to person, place, and time. She appears well-developed and well-nourished.  Cardiovascular: Normal rate, regular rhythm and normal heart sounds.   Pulmonary/Chest: Effort normal and breath sounds normal.  Neurological: She is alert and oriented to person, place, and time.  Psychiatric: She has a normal mood and affect. Her behavior is normal.   BP (!) 88/57   Pulse 80   Temp 97.3 F (36.3 C) (Oral)   Ht 5\' 5"  (1.651 m)   Wt 165 lb (74.8 kg)    BMI 27.46 kg/m         Assessment & Plan:  1. Essential hypertension Blood pressure little bit on the low side today. Suggest reducing Vasotec from 10 mg to 5 mg and follow  2. Malaise and fatigue Has had blood work this year which was okay. Has been on same dose of citalopram, 20 mg for a long time. I have asked her to ask her mental health professional about increasing the dose. Her husband died 4 years ago and her only son has gone off to college and thinks she is depressed and lonely and she admits as much. This could be the source of her malaise and fatigue  Wardell Honour MD

## 2016-06-23 ENCOUNTER — Telehealth: Payer: Self-pay | Admitting: Family Medicine

## 2016-06-23 NOTE — Telephone Encounter (Signed)
Denied.

## 2016-07-17 DIAGNOSIS — F3341 Major depressive disorder, recurrent, in partial remission: Secondary | ICD-10-CM | POA: Diagnosis not present

## 2016-07-25 DIAGNOSIS — Z23 Encounter for immunization: Secondary | ICD-10-CM | POA: Diagnosis not present

## 2016-08-13 ENCOUNTER — Other Ambulatory Visit: Payer: Self-pay | Admitting: Family Medicine

## 2016-08-14 ENCOUNTER — Encounter: Payer: Self-pay | Admitting: Family Medicine

## 2016-08-14 ENCOUNTER — Ambulatory Visit (INDEPENDENT_AMBULATORY_CARE_PROVIDER_SITE_OTHER): Payer: BLUE CROSS/BLUE SHIELD | Admitting: Family Medicine

## 2016-08-14 ENCOUNTER — Other Ambulatory Visit: Payer: Self-pay | Admitting: Family Medicine

## 2016-08-14 VITALS — BP 128/92 | HR 94 | Temp 98.0°F | Ht 65.0 in | Wt 162.2 lb

## 2016-08-14 DIAGNOSIS — H938X1 Other specified disorders of right ear: Secondary | ICD-10-CM | POA: Diagnosis not present

## 2016-08-14 DIAGNOSIS — G5602 Carpal tunnel syndrome, left upper limb: Secondary | ICD-10-CM

## 2016-08-14 DIAGNOSIS — A084 Viral intestinal infection, unspecified: Secondary | ICD-10-CM

## 2016-08-14 MED ORDER — ONDANSETRON 4 MG PO TBDP: 4.0000 mg | ORAL_TABLET | Freq: Three times a day (TID) | ORAL | 0 refills | Status: DC | PRN

## 2016-08-14 NOTE — Patient Instructions (Signed)
Great to see you!  Try to get some fluids and rest.   We will call with lab results within 1 week  Try the ear muffs instead of the ear plugs at work.   Try a cock up splint while you sleep n your left hand for carpel tunnel syndrome.

## 2016-08-14 NOTE — Progress Notes (Signed)
   HPI  Patient presents today here with acute illness.  Patient explains that over the last 3 days she's had nausea, loose stools, and headache.  She's had 2-3 episodes of emesis with small amounts of stomach contents, nonbloody nonbilious. She describes bitemporal headache as throbbing in nature. She's not tolerating much food due to nausea and diarrhea. She is tolerating fluids.  She has not tried Zofran, however she does have some at home.  She complains of right-sided ear canal irritation for several months.  She also complains of left hand numbness for several months, mostly in the fingers, especially at work while she is doing repetitive hand motions.   PMH: Smoking status noted ROS: Per HPI  Objective: BP (!) 128/92   Pulse 94   Temp 98 F (36.7 C) (Oral)   Ht 5' 5" (1.651 m)   Wt 162 lb 3.2 oz (73.6 kg)   BMI 26.99 kg/m  Gen: NAD, alert, cooperative with exam HEENT: NCAT, right ear canal with some irritation CV: RRR, good S1/S2, no murmur Resp: CTABL, no wheezes, non-labored Abd: Soft, positive bowel sounds, mild tenderness to palpation of left upper and lower quadrants, no guarding Ext: No edema, warm Neuro: Alert and oriented, No gross deficits, positive Phalen's test for left wrist  Assessment and plan:  # Viral gastroenteritis Nausea, mild abdominal tenderness on exam, loose stools, and headache this is the most likely diagnosis Recommended fluids, Zofran, rest.   # Irritation of the right ear canal Recommended here for sound protection rather than earplugs  #Carpal tunnel syndrome Discussed, trial of cock-up splint's    Orders Placed This Encounter  Procedures  . CMP14+EGFR  . CBC with Differential    Meds ordered this encounter  Medications  . ondansetron (ZOFRAN-ODT) 4 MG disintegrating tablet    Sig: Take 1 tablet (4 mg total) by mouth every 8 (eight) hours as needed for nausea or vomiting.    Dispense:  8 tablet    Refill:  0    Sam  , MD [facility] 08/14/2016, 4:09 PM     

## 2016-08-15 LAB — CBC WITH DIFFERENTIAL/PLATELET
BASOS: 1 %
Basophils Absolute: 0 10*3/uL (ref 0.0–0.2)
EOS (ABSOLUTE): 0.1 10*3/uL (ref 0.0–0.4)
EOS: 1 %
HEMATOCRIT: 41.7 % (ref 34.0–46.6)
Hemoglobin: 14.7 g/dL (ref 11.1–15.9)
IMMATURE GRANS (ABS): 0 10*3/uL (ref 0.0–0.1)
IMMATURE GRANULOCYTES: 0 %
LYMPHS: 41 %
Lymphocytes Absolute: 2.6 10*3/uL (ref 0.7–3.1)
MCH: 32.6 pg (ref 26.6–33.0)
MCHC: 35.3 g/dL (ref 31.5–35.7)
MCV: 93 fL (ref 79–97)
Monocytes Absolute: 0.8 10*3/uL (ref 0.1–0.9)
Monocytes: 13 %
NEUTROS PCT: 44 %
Neutrophils Absolute: 2.8 10*3/uL (ref 1.4–7.0)
Platelets: 370 10*3/uL (ref 150–379)
RBC: 4.51 x10E6/uL (ref 3.77–5.28)
RDW: 14 % (ref 12.3–15.4)
WBC: 6.3 10*3/uL (ref 3.4–10.8)

## 2016-08-15 LAB — CMP14+EGFR
ALT: 23 IU/L (ref 0–32)
AST: 18 IU/L (ref 0–40)
Albumin/Globulin Ratio: 1.6 (ref 1.2–2.2)
Albumin: 4.6 g/dL (ref 3.5–5.5)
Alkaline Phosphatase: 81 IU/L (ref 39–117)
BUN/Creatinine Ratio: 10 (ref 9–23)
BUN: 10 mg/dL (ref 6–24)
Bilirubin Total: 0.7 mg/dL (ref 0.0–1.2)
CALCIUM: 10 mg/dL (ref 8.7–10.2)
CO2: 26 mmol/L (ref 18–29)
CREATININE: 1.01 mg/dL — AB (ref 0.57–1.00)
Chloride: 94 mmol/L — ABNORMAL LOW (ref 96–106)
GFR, EST AFRICAN AMERICAN: 78 mL/min/{1.73_m2} (ref >59)
GFR, EST NON AFRICAN AMERICAN: 67 mL/min/{1.73_m2} (ref >59)
GLOBULIN, TOTAL: 2.8 g/dL (ref 1.5–4.5)
Glucose: 104 mg/dL — ABNORMAL HIGH (ref 65–99)
Potassium: 4.2 mmol/L (ref 3.5–5.2)
SODIUM: 136 mmol/L (ref 134–144)
TOTAL PROTEIN: 7.4 g/dL (ref 6.0–8.5)

## 2016-08-28 DIAGNOSIS — Z01419 Encounter for gynecological examination (general) (routine) without abnormal findings: Secondary | ICD-10-CM | POA: Diagnosis not present

## 2016-08-28 DIAGNOSIS — Z114 Encounter for screening for human immunodeficiency virus [HIV]: Secondary | ICD-10-CM | POA: Diagnosis not present

## 2016-08-28 DIAGNOSIS — Z716 Tobacco abuse counseling: Secondary | ICD-10-CM | POA: Diagnosis not present

## 2016-08-28 DIAGNOSIS — Z113 Encounter for screening for infections with a predominantly sexual mode of transmission: Secondary | ICD-10-CM | POA: Diagnosis not present

## 2016-08-28 DIAGNOSIS — Z3009 Encounter for other general counseling and advice on contraception: Secondary | ICD-10-CM | POA: Diagnosis not present

## 2016-09-18 DIAGNOSIS — F17210 Nicotine dependence, cigarettes, uncomplicated: Secondary | ICD-10-CM | POA: Diagnosis not present

## 2016-09-18 DIAGNOSIS — Z716 Tobacco abuse counseling: Secondary | ICD-10-CM | POA: Diagnosis not present

## 2016-09-21 ENCOUNTER — Ambulatory Visit: Payer: BLUE CROSS/BLUE SHIELD | Admitting: Family Medicine

## 2016-10-21 ENCOUNTER — Other Ambulatory Visit: Payer: Self-pay | Admitting: Family Medicine

## 2016-10-30 DIAGNOSIS — Z008 Encounter for other general examination: Secondary | ICD-10-CM | POA: Diagnosis not present

## 2016-10-30 DIAGNOSIS — F419 Anxiety disorder, unspecified: Secondary | ICD-10-CM | POA: Diagnosis not present

## 2016-10-30 DIAGNOSIS — F17210 Nicotine dependence, cigarettes, uncomplicated: Secondary | ICD-10-CM | POA: Diagnosis not present

## 2016-10-30 DIAGNOSIS — Z716 Tobacco abuse counseling: Secondary | ICD-10-CM | POA: Diagnosis not present

## 2016-11-22 DIAGNOSIS — Z716 Tobacco abuse counseling: Secondary | ICD-10-CM | POA: Diagnosis not present

## 2016-11-22 DIAGNOSIS — F17210 Nicotine dependence, cigarettes, uncomplicated: Secondary | ICD-10-CM | POA: Diagnosis not present

## 2016-12-06 DIAGNOSIS — Z716 Tobacco abuse counseling: Secondary | ICD-10-CM | POA: Diagnosis not present

## 2016-12-06 DIAGNOSIS — F17210 Nicotine dependence, cigarettes, uncomplicated: Secondary | ICD-10-CM | POA: Diagnosis not present

## 2016-12-25 DIAGNOSIS — F3341 Major depressive disorder, recurrent, in partial remission: Secondary | ICD-10-CM | POA: Diagnosis not present

## 2017-05-01 ENCOUNTER — Encounter: Payer: Self-pay | Admitting: Family Medicine

## 2017-05-01 ENCOUNTER — Ambulatory Visit (INDEPENDENT_AMBULATORY_CARE_PROVIDER_SITE_OTHER): Payer: BLUE CROSS/BLUE SHIELD | Admitting: Family Medicine

## 2017-05-01 ENCOUNTER — Encounter: Payer: Self-pay | Admitting: *Deleted

## 2017-05-01 VITALS — BP 107/79 | HR 94 | Temp 97.1°F | Ht 65.0 in | Wt 153.0 lb

## 2017-05-01 DIAGNOSIS — R5383 Other fatigue: Secondary | ICD-10-CM

## 2017-05-01 DIAGNOSIS — R5381 Other malaise: Secondary | ICD-10-CM

## 2017-05-01 DIAGNOSIS — N3090 Cystitis, unspecified without hematuria: Secondary | ICD-10-CM

## 2017-05-01 LAB — URINALYSIS
BILIRUBIN UA: NEGATIVE
Glucose, UA: NEGATIVE
KETONES UA: NEGATIVE
Leukocytes, UA: NEGATIVE
NITRITE UA: NEGATIVE
Protein, UA: NEGATIVE
SPEC GRAV UA: 1.015 (ref 1.005–1.030)
Urobilinogen, Ur: 0.2 mg/dL (ref 0.2–1.0)
pH, UA: 5 (ref 5.0–7.5)

## 2017-05-01 MED ORDER — ENALAPRIL MALEATE 10 MG PO TABS: 10.0000 mg | ORAL_TABLET | Freq: Every day | ORAL | 0 refills | Status: DC

## 2017-05-01 MED ORDER — OMEPRAZOLE 20 MG PO CPDR: 20.0000 mg | DELAYED_RELEASE_CAPSULE | Freq: Every day | ORAL | 0 refills | Status: DC

## 2017-05-01 MED ORDER — CIPROFLOXACIN HCL 500 MG PO TABS: 500.0000 mg | ORAL_TABLET | Freq: Two times a day (BID) | ORAL | 0 refills | Status: DC

## 2017-05-01 NOTE — Progress Notes (Signed)
Subjective:  Patient ID: Doris Newman, female    DOB: 10/23/1970  Age: 46 y.o. MRN: [id number]  CC: Nausea and Headache (some light-headed x 2 days )   HPI Doris Newman presents for onset last night of a headache described as a dull ache all over. Also has been feeling lightheaded and nauseous.  Depression screen [facility] 2/9 05/01/2017 08/14/2016 06/20/2016  Decreased Interest 0 1 0  Down, Depressed, Hopeless 0 1 0  PHQ - 2 Score 0 2 0  Altered sleeping - 1 -  Tired, decreased energy - 2 -  Change in appetite - 2 -  Feeling bad or failure about yourself  - 1 -  Trouble concentrating - 0 -  Moving slowly or fidgety/restless - 0 -  Suicidal thoughts - 0 -  PHQ-9 Score - 8 -    History Doris Newman has a past medical history of Anxiety; GERD (gastroesophageal reflux disease); and Hypertension.   She has no past surgical history on file.   Her family history is not on file.She reports that she has been smoking.  She has never used smokeless tobacco. She reports that she does not drink alcohol or use drugs.    ROS Review of Systems  Constitutional: Negative for activity change, appetite change and fever.  HENT: Negative for congestion, rhinorrhea and sore throat.   Eyes: Negative for visual disturbance.  Respiratory: Negative for cough and shortness of breath.   Cardiovascular: Negative for chest pain and palpitations.  Gastrointestinal: Negative for abdominal pain, diarrhea and nausea.  Genitourinary: Negative for dysuria.  Musculoskeletal: Negative for arthralgias and myalgias.    Objective:  BP 107/79 (BP Location: Right Arm)   Pulse 94   Temp (!) 97.1 F (36.2 C) (Oral)   Ht _0  (1.651 m)   Wt 153 lb (69.4 kg)   BMI 25.46 kg/m   BP Readings from Last 3 Encounters:  05/01/17 107/79  08/14/16 (!) 128/92  06/20/16 (!) 88/57    Wt Readings from Last 3 Encounters:  05/01/17 153 lb (69.4 kg)  08/14/16 162 lb 3.2 oz (73.6 kg)  06/20/16 165 lb (74.8 kg)      Physical Exam  Constitutional: She is oriented to person, place, and time. She appears well-developed and well-nourished. No distress.  HENT:  Head: Normocephalic and atraumatic.  Right Ear: External ear normal.  Left Ear: External ear normal.  Nose: Nose normal.  Mouth/Throat: Oropharynx is clear and moist.  Eyes: Pupils are equal, round, and reactive to light. Conjunctivae and EOM are normal.  Neck: Normal range of motion. Neck supple. No thyromegaly present.  Cardiovascular: Normal rate, regular rhythm and normal heart sounds.   No murmur heard. Pulmonary/Chest: Effort normal and breath sounds normal. No respiratory distress. She has no wheezes. She has no rales.  Abdominal: Soft. Bowel sounds are normal. She exhibits no distension. There is no tenderness.  Lymphadenopathy:    She has no cervical adenopathy.  Neurological: She is alert and oriented to person, place, and time. She has normal reflexes.  Skin: Skin is warm and dry.  Psychiatric: She has a normal mood and affect. Her behavior is normal. Judgment and thought content normal.      Assessment & Plan:   Randall was seen today for nausea and headache.  Diagnoses and all orders for this visit:  Malaise and fatigue -     CBC with Differential/Platelet -     CMP14+EGFR -     CBC with Differential/Platelet -  CMP14+EGFR -     Urinalysis -     Urine Culture  Cystitis  Other orders -     ciprofloxacin (CIPRO) 500 MG tablet; Take 1 tablet (500 mg total) by mouth 2 (two) times daily. -     enalapril (VASOTEC) 10 MG tablet; Take 1 tablet (10 mg total) by mouth daily. -     omeprazole (PRILOSEC) 20 MG capsule; Take 1 capsule (20 mg total) by mouth daily.       I have discontinued Doris Newman's cyclobenzaprine and ondansetron. I have also changed her enalapril and omeprazole. Additionally, I am having her start on ciprofloxacin. Lastly, I am having her maintain her ALPRAZolam,  Aspirin-Acetaminophen-Caffeine (GOODY HEADACHE PO), citalopram, norethindrone, and ibuprofen.  Allergies as of 05/01/2017   No Known Allergies     Medication List       Accurate as of 05/01/17 11:59 PM. Always use your most recent med list.          ALPRAZolam 1 MG tablet Commonly known as:  XANAX Take 1 mg by mouth 4 (four) times daily.   ciprofloxacin 500 MG tablet Commonly known as:  CIPRO Take 1 tablet (500 mg total) by mouth 2 (two) times daily.   citalopram 20 MG tablet Commonly known as:  CELEXA Take 20 mg by mouth daily.   enalapril 10 MG tablet Commonly known as:  VASOTEC Take 1 tablet (10 mg total) by mouth daily.   GOODY HEADACHE PO Take 1 packet by mouth every 12 (twelve) hours as needed. headache   ibuprofen 800 MG tablet Commonly known as:  ADVIL,MOTRIN Take 1 tablet (800 mg total) by mouth every 8 (eight) hours as needed.   norethindrone 0.35 MG tablet Commonly known as:  MICRONOR,CAMILA,ERRIN Take 1 tablet by mouth daily.   omeprazole 20 MG capsule Commonly known as:  PRILOSEC Take 1 capsule (20 mg total) by mouth daily.        Follow-up: No Follow-up on file.  Claretta Fraise, M.D.

## 2017-05-02 ENCOUNTER — Telehealth: Payer: Self-pay | Admitting: Family Medicine

## 2017-05-02 NOTE — Telephone Encounter (Signed)
Please contact the patient the lab has had problems all today with reports due to computer problems. Please call [facility] to see if you can get a report.

## 2017-05-03 LAB — CBC WITH DIFFERENTIAL/PLATELET
Basophils Absolute: 0 10*3/uL (ref 0.0–0.2)
Basos: 1 %
EOS (ABSOLUTE): 0.1 10*3/uL (ref 0.0–0.4)
EOS: 2 %
HEMATOCRIT: 42.4 % (ref 34.0–46.6)
Hemoglobin: 14 g/dL (ref 11.1–15.9)
IMMATURE GRANULOCYTES: 0 %
Immature Grans (Abs): 0 10*3/uL (ref 0.0–0.1)
LYMPHS: 48 %
Lymphocytes Absolute: 3 10*3/uL (ref 0.7–3.1)
MCH: 33.2 pg — ABNORMAL HIGH (ref 26.6–33.0)
MCHC: 33 g/dL (ref 31.5–35.7)
MCV: 101 fL — ABNORMAL HIGH (ref 79–97)
Monocytes Absolute: 0.4 10*3/uL (ref 0.1–0.9)
Monocytes: 6 %
NEUTROS PCT: 43 %
Neutrophils Absolute: 2.6 10*3/uL (ref 1.4–7.0)
PLATELETS: 367 10*3/uL (ref 150–379)
RBC: 4.22 x10E6/uL (ref 3.77–5.28)
RDW: 14.9 % (ref 12.3–15.4)
WBC: 6.2 10*3/uL (ref 3.4–10.8)

## 2017-05-03 LAB — CMP14+EGFR
ALT: 33 IU/L — AB (ref 0–32)
AST: 34 IU/L (ref 0–40)
Albumin/Globulin Ratio: 1.8 (ref 1.2–2.2)
Albumin: 4.4 g/dL (ref 3.5–5.5)
Alkaline Phosphatase: 63 IU/L (ref 39–117)
BUN/Creatinine Ratio: 8 — ABNORMAL LOW (ref 9–23)
BUN: 7 mg/dL (ref 6–24)
Bilirubin Total: 0.3 mg/dL (ref 0.0–1.2)
CALCIUM: 9.1 mg/dL (ref 8.7–10.2)
CO2: 15 mmol/L — AB (ref 20–29)
CREATININE: 0.89 mg/dL (ref 0.57–1.00)
Chloride: 99 mmol/L (ref 96–106)
GFR calc Af Amer: 90 mL/min/{1.73_m2} (ref >59)
GFR, EST NON AFRICAN AMERICAN: 78 mL/min/{1.73_m2} (ref >59)
Globulin, Total: 2.4 g/dL (ref 1.5–4.5)
Glucose: 50 mg/dL — ABNORMAL LOW (ref 65–99)
Potassium: 4.4 mmol/L (ref 3.5–5.2)
Sodium: 138 mmol/L (ref 134–144)
Total Protein: 6.8 g/dL (ref 6.0–8.5)

## 2017-05-03 LAB — URINE CULTURE: Organism ID, Bacteria: NO GROWTH

## 2017-05-03 NOTE — Telephone Encounter (Signed)
Spoke with pt and she states she did a home urine HCG and it was negative so she is ok with that result and if she changes her mind and wants a urine HCG she will call back and I will put the order in since Dr Livia Snellen had mentioned it during her visit.

## 2017-05-08 ENCOUNTER — Other Ambulatory Visit: Payer: Self-pay | Admitting: Family Medicine

## 2017-06-04 ENCOUNTER — Ambulatory Visit (INDEPENDENT_AMBULATORY_CARE_PROVIDER_SITE_OTHER): Payer: Worker's Compensation

## 2017-06-04 ENCOUNTER — Ambulatory Visit (INDEPENDENT_AMBULATORY_CARE_PROVIDER_SITE_OTHER): Payer: Worker's Compensation | Admitting: Family Medicine

## 2017-06-04 ENCOUNTER — Encounter: Payer: Self-pay | Admitting: Family Medicine

## 2017-06-04 VITALS — BP 120/86 | HR 71 | Temp 98.3°F | Ht 65.0 in | Wt 162.0 lb

## 2017-06-04 DIAGNOSIS — M25532 Pain in left wrist: Secondary | ICD-10-CM

## 2017-06-04 MED ORDER — PREDNISONE 20 MG PO TABS: 40.0000 mg | ORAL_TABLET | Freq: Every day | ORAL | 0 refills | Status: DC

## 2017-06-04 NOTE — Progress Notes (Signed)
BP 120/86   Pulse 71   Temp 98.3 F (36.8 C) (Oral)   Ht 5\' 5"  (1.651 m)   Wt 162 lb (73.5 kg)   BMI 26.96 kg/m    Subjective:    Patient ID: Doris Newman, female    DOB: Mar 11, 1971, 46 y.o.   MRN: [id number]  HPI: Doris Newman is a 46 y.o. female presenting on 06/04/2017 for Left wrist pain (needs drug screen)   HPI Left wrist pain Patient comes in complaining of left wrist pain that has been bothering her since 05/23/2017. She cannot recall any specific event or trauma that brought it on but she does have repetitive movements that she doesn't work. 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 The pain hurts on the thumb side of her wrist and she feels like she has a small nodule there that new and the pain is associated with that and that anterior wrist in the center as well. She rates the pain as moderate and has been keeping her from doing her job and she is missed job days because of it.  Relevant past medical, surgical, family and social history reviewed and updated as indicated. Interim medical history since our last visit reviewed. Allergies and medications reviewed and updated.  Review of Systems  Constitutional: Negative for chills and fever.  Eyes: Negative for visual disturbance.  Respiratory: Negative for chest tightness and shortness of breath.   Cardiovascular: Negative for chest pain and leg swelling.  Musculoskeletal: Positive for arthralgias. Negative for back pain and gait problem.  Skin: Negative for rash.  Neurological: Negative for weakness, light-headedness, numbness and headaches.  Psychiatric/Behavioral: Negative for agitation and behavioral problems.  All other systems reviewed and are negative.   Per HPI unless specifically indicated above     Objective:    BP 120/86   Pulse 71   Temp 98.3 F (36.8 C) (Oral)   Ht 5\' 5"  (1.651 m)   Wt 162 lb (73.5 kg)   BMI 26.96 kg/m   Wt Readings from Last 3 Encounters:  06/04/17 162  lb (73.5 kg)  05/01/17 153 lb (69.4 kg)  08/14/16 162 lb 3.2 oz (73.6 kg)    Physical Exam  Constitutional: She is oriented to person, place, and time. She appears well-developed and well-nourished. No distress.  Eyes: Conjunctivae are normal.  Cardiovascular: Normal rate, regular rhythm, normal heart sounds and intact distal pulses.   No murmur heard. Pulmonary/Chest: Effort normal and breath sounds normal. No respiratory distress. She has no wheezes.  Musculoskeletal: Normal range of motion. She exhibits tenderness. She exhibits no edema.       Left wrist: She exhibits tenderness (Tenderness and slightly increased bronchial prominence on the lateral aspect of the anterior wrist. No weakness or numbness. No loss of range of motion. A slight amount of inflammation and swelling is noted in the area.). She exhibits normal range of motion, no bony tenderness and no crepitus.  Neurological: She is alert and oriented to person, place, and time. Coordination normal.  Skin: Skin is warm and dry. No rash noted. She is not diaphoretic.  Psychiatric: She has a normal mood and affect. Her behavior is normal.  Nursing note and vitals reviewed.  Left wrist x-ray: No acute bony abnormality noted    Assessment & Plan:   Problem List Items Addressed This Visit    None    Visit Diagnoses    Acute pain of left wrist    -  Primary  Relevant Orders   DG Wrist Complete Left (Completed)      Recommended light duty and no repetitive movements with that left hand, return in 2 weeks for possible clearance  Follow up plan: Return if symptoms worsen or fail to improve.  Counseling provided for all of the vaccine components Orders Placed This Encounter  Procedures  . DG Wrist Complete Left    Caryl Pina, MD [facility] 06/04/2017, 1:58 PM

## 2017-06-08 ENCOUNTER — Telehealth: Payer: Self-pay | Admitting: Family Medicine

## 2017-06-08 NOTE — Telephone Encounter (Signed)
Please advise 

## 2017-06-08 NOTE — Telephone Encounter (Signed)
[email or web address]

## 2017-06-08 NOTE — Telephone Encounter (Signed)
Pt needs to be seen

## 2017-06-08 NOTE — Telephone Encounter (Signed)
Apt made with dettinger.

## 2017-06-11 ENCOUNTER — Ambulatory Visit: Payer: Self-pay | Admitting: Family Medicine

## 2017-06-13 ENCOUNTER — Ambulatory Visit (INDEPENDENT_AMBULATORY_CARE_PROVIDER_SITE_OTHER): Payer: Worker's Compensation | Admitting: Family Medicine

## 2017-06-13 ENCOUNTER — Encounter: Payer: Self-pay | Admitting: Family Medicine

## 2017-06-13 VITALS — BP 135/89 | HR 80 | Temp 98.0°F | Ht 65.0 in | Wt 162.0 lb

## 2017-06-13 DIAGNOSIS — M25532 Pain in left wrist: Secondary | ICD-10-CM | POA: Diagnosis not present

## 2017-06-13 NOTE — Progress Notes (Signed)
BP 135/89   Pulse 80   Temp 98 F (36.7 C) (Oral)   Ht 5\' 5"  (1.651 m)   Wt 162 lb (73.5 kg)   BMI 26.96 kg/m    Subjective:    Patient ID: Doris Newman, female    DOB: 08-May-1971, 46 y.o.   MRN: [id number]  HPI: Doris Newman is a 46 y.o. female presenting on 06/13/2017 for Worker's Comp followup (knot on wrist still there and painful)   HPI Worker's Comp., continued left wrist pain Patient has had continued left wrist pain and not on that left anterior wrist. Course of prednisone did not help her and she feels like it's worsened. She is continued to be on light duty at work. She works for [employer] the initial injury was 05/23/2017 and has been gradual. She denies any fevers or chills or numbness or weakness. She has pain with all range of motion and use of that left arm.  Relevant past medical, surgical, family and social history reviewed and updated as indicated. Interim medical history since our last visit reviewed. Allergies and medications reviewed and updated.  Review of Systems  Constitutional: Negative for chills and fever.  Respiratory: Negative for chest tightness and shortness of breath.   Cardiovascular: Negative for chest pain and leg swelling.  Genitourinary: Negative for difficulty urinating and dysuria.  Musculoskeletal: Positive for arthralgias. Negative for back pain and gait problem.  Skin: Negative for color change and rash.  Neurological: Negative for light-headedness and headaches.  Psychiatric/Behavioral: Negative for agitation and behavioral problems.  All other systems reviewed and are negative.   Per HPI unless specifically indicated above     Objective:    BP 135/89   Pulse 80   Temp 98 F (36.7 C) (Oral)   Ht 5\' 5"  (1.651 m)   Wt 162 lb (73.5 kg)   BMI 26.96 kg/m   Wt Readings from Last 3 Encounters:  06/13/17 162 lb (73.5 kg)  06/04/17 162 lb (73.5 kg)  05/01/17 153 lb (69.4 kg)    Physical Exam    Constitutional: She appears well-developed and well-nourished. No distress.  Eyes: Conjunctivae are normal.  Musculoskeletal: Normal range of motion. She exhibits no edema.       Left wrist: She exhibits tenderness (Tenderness and small hard nodule on anterolateral left wrist. Nonmobile. No overlying skin changes) and deformity. She exhibits normal range of motion and no swelling.  Neurological: She is alert. Coordination normal.  Skin: Skin is warm and dry. No rash noted. She is not diaphoretic. No erythema.  Psychiatric: She has a normal mood and affect. Her behavior is normal.  Nursing note and vitals reviewed.      Assessment & Plan:   Problem List Items Addressed This Visit    None    Visit Diagnoses    Acute pain of left wrist    -  Primary   Likely calcification versus tendinitis, did not show up on x-ray, refer to orthopedic, did not improve with prednisone   Relevant Orders   Ambulatory referral to Orthopedic Surgery      Have patient continue on light duty until cleared by orthopedic with limited use of left hand  Follow up plan: Return if symptoms worsen or fail to improve.  Counseling provided for all of the vaccine components Orders Placed This Encounter  Procedures  . Ambulatory referral to [place] Jasara Corrigan, MD [facility] 06/13/2017, 9:26 AM

## 2017-06-20 ENCOUNTER — Ambulatory Visit: Payer: Self-pay | Admitting: Family Medicine

## 2017-08-06 ENCOUNTER — Other Ambulatory Visit: Payer: Self-pay | Admitting: Family Medicine

## 2017-08-14 ENCOUNTER — Other Ambulatory Visit: Payer: Self-pay | Admitting: *Deleted

## 2017-08-14 MED ORDER — OMEPRAZOLE 20 MG PO CPDR: 20.0000 mg | DELAYED_RELEASE_CAPSULE | Freq: Every day | ORAL | 0 refills | Status: DC

## 2017-10-21 ENCOUNTER — Other Ambulatory Visit: Payer: Self-pay | Admitting: Family Medicine

## 2017-10-22 LAB — HM PAP SMEAR

## 2017-11-06 ENCOUNTER — Other Ambulatory Visit: Payer: Self-pay | Admitting: Nurse Practitioner

## 2017-11-06 DIAGNOSIS — Z1231 Encounter for screening mammogram for malignant neoplasm of breast: Secondary | ICD-10-CM

## 2017-11-11 ENCOUNTER — Other Ambulatory Visit: Payer: Self-pay | Admitting: Family Medicine

## 2017-11-12 NOTE — Telephone Encounter (Signed)
Last seen 06/13/17  Dr D

## 2018-02-13 ENCOUNTER — Other Ambulatory Visit: Payer: Self-pay | Admitting: Family Medicine

## 2018-02-13 NOTE — Telephone Encounter (Signed)
Last seen 06/13/17  Dr [name]

## 2018-03-18 ENCOUNTER — Encounter: Payer: Self-pay | Admitting: Family Medicine

## 2018-03-18 ENCOUNTER — Ambulatory Visit: Payer: BLUE CROSS/BLUE SHIELD | Admitting: Family Medicine

## 2018-03-18 VITALS — BP 136/89 | HR 93 | Temp 97.4°F | Ht 65.0 in | Wt 174.0 lb

## 2018-03-18 DIAGNOSIS — S96819A Strain of other specified muscles and tendons at ankle and foot level, unspecified foot, initial encounter: Secondary | ICD-10-CM

## 2018-03-18 DIAGNOSIS — S96812A Strain of other specified muscles and tendons at ankle and foot level, left foot, initial encounter: Secondary | ICD-10-CM

## 2018-03-18 MED ORDER — ENALAPRIL MALEATE 10 MG PO TABS: 10.0000 mg | ORAL_TABLET | Freq: Every day | ORAL | 0 refills | Status: DC

## 2018-03-18 MED ORDER — OMEPRAZOLE 20 MG PO CPDR: 20.0000 mg | DELAYED_RELEASE_CAPSULE | Freq: Every day | ORAL | 0 refills | Status: DC

## 2018-03-18 NOTE — Progress Notes (Signed)
BP 136/89   Pulse 93   Temp (!) 97.4 F (36.3 C) (Oral)   Ht 5\' 5"  (1.651 m)   Wt 174 lb (78.9 kg)   BMI 28.96 kg/m    Subjective:    Patient ID: Doris Newman, female    DOB: 1971-05-15, 47 y.o.   MRN: [id number]  HPI: Patient is a 47yr old female who presents to the clinic with 1-day history of left calf pain. Patient reports walking down the stairs to her basement yesterday when she experienced a sudden "pop" and felt an immediate sharp pain which she indicates being located along the medial aspect of her left mid-calf. She describes the severity as 8/10 and worsened with weightbearing. She expresses great discomfort with standing and walking on the affected leg. She has not taken any medications or attempted any home remedies to alleviate the pain. She denies mis-stepping or falling prior to symptom onset. Of note, patient reports "stepping over a ditch" a couple of months ago and experiencing a similar "pop," which wasn't as severe and seemed to heal on it's own.    Relevant past medical, surgical, family and social history reviewed and updated as indicated. Interim medical history since our last visit reviewed. Allergies and medications reviewed and updated.  Review of Systems  Constitutional: Negative.   Musculoskeletal: Positive for gait problem.       Patient reports a "sharp pain" in left medial aspect of mid-calf that is worse with weightbearing.  Skin: Negative.   Neurological: Negative for dizziness, weakness, light-headedness and numbness.      Per HPI unless specifically indicated above     Objective:    BP 136/89   Pulse 93   Temp (!) 97.4 F (36.3 C) (Oral)   Ht 5\' 5"  (1.651 m)   Wt 174 lb (78.9 kg)   BMI 28.96 kg/m   Wt Readings from Last 3 Encounters:  03/18/18 174 lb (78.9 kg)  06/13/17 162 lb (73.5 kg)  06/04/17 162 lb (73.5 kg)    Physical Exam  Constitutional: She is oriented to person, place, and time. She appears well-developed and  well-nourished. No distress.  Cardiovascular: Normal rate, regular rhythm and normal heart sounds.  Pulmonary/Chest: Effort normal and breath sounds normal.  Musculoskeletal: She exhibits tenderness (No Achilles tendon tenderness and no weakness with plantar or dorsiflexion).  Patient has left medial mid-calf pain with palpation, dorsiflexion and plantar flexion. Minimal swelling noted at left ankle. Patient exhibited altered gait and inability to bear weight on affected leg.  No tenderness in popliteal fossa, left knee or ankle.   Neurological: She is alert and oriented to person, place, and time.  Skin: Skin is warm and dry.  Psychiatric: She has a normal mood and affect.       Assessment & Plan:  Patient presents to the clinic with 1 day history of left medial mid-calf pain after experiencing a "pop" going down the stairs. History and physical exam findings are consistent with a plantaris tendon rupture.   I have explained to the patient that there is no need for surgical repair of this injury, as it will gradually heal on its own. The patient already has Aleve at home, so I have instructed her to take 2 Aleve BID for 1 week to help with pain and inflammation. I have encouraged her to rest, ice and elevated her leg as much as possible over the next week. I have also advised her to incorporate gentle stretching as tolerated  to help with recovery.   Problem List Items Addressed This Visit    None    Visit Diagnoses    Rupture of plantaris tendon, initial encounter    -  Primary       Follow up plan: I have instructed the patient to contact our office should her symptoms not improve or worsen.   Counseling provided for all of the vaccine components No orders of the defined types were placed in this encounter.  Patient seen and examined with Vita Barley, PA student, agree with assessment and plan above.  We will do conservative management and this should heal up nicely for her.   Gave her 1 week off work. Caryl Pina, MD [facility] 03/18/2018, 2:07 PM

## 2018-03-25 ENCOUNTER — Ambulatory Visit: Payer: BLUE CROSS/BLUE SHIELD | Admitting: Family Medicine

## 2018-03-25 ENCOUNTER — Encounter: Payer: Self-pay | Admitting: Family Medicine

## 2018-03-25 VITALS — BP 130/87 | HR 74 | Temp 98.2°F | Ht 65.0 in | Wt 175.0 lb

## 2018-03-25 DIAGNOSIS — Z23 Encounter for immunization: Secondary | ICD-10-CM

## 2018-03-25 DIAGNOSIS — S96812D Strain of other specified muscles and tendons at ankle and foot level, left foot, subsequent encounter: Secondary | ICD-10-CM

## 2018-03-25 NOTE — Progress Notes (Signed)
BP 130/87   Pulse 74   Temp 98.2 F (36.8 C) (Oral)   Ht 5\' 5"  (1.651 m)   Wt 175 lb (79.4 kg)   BMI 29.12 kg/m    Subjective:    Patient ID: Doris Newman, female    DOB: 1971/04/28, 47 y.o.   MRN: [id number]  HPI: Doris Newman is a 47 y.o. female presenting on 03/25/2018 for Still having pain in calf (supposed to go back to work today, does not feel she can yet - 12 hour shifts, concrete floors, climbing up and down ladders, supposed to work Wednesday and Thursday this week and then not go back until next Monday)   HPI Calf pain Patient is coming in with complaints of continued calf pain on her left calf.  She says it has been improving but it is not to the point where she feels like she can go back to work.  She has been using ibuprofen and stretching and trying to walk some and does feel like she is getting improvement but it still is very stiff and because she works in a job where she has to walk a lot of concrete floors and climb ladders she does not feel like she is going to be able to do that this week.  We discussed doing physical therapy and she declined it for now but will continue to do things at home.  Relevant past medical, surgical, family and social history reviewed and updated as indicated. Interim medical history since our last visit reviewed. Allergies and medications reviewed and updated.  Review of Systems  Constitutional: Negative for chills and fever.  Eyes: Negative for visual disturbance.  Respiratory: Negative for chest tightness and shortness of breath.   Cardiovascular: Negative for chest pain and leg swelling.  Musculoskeletal: Positive for myalgias. Negative for back pain and gait problem.  Skin: Negative for rash.  Neurological: Negative for light-headedness and headaches.  Psychiatric/Behavioral: Negative for agitation and behavioral problems.  All other systems reviewed and are negative.   Per HPI unless specifically indicated  above   Allergies as of 03/25/2018   No Known Allergies     Medication List        Accurate as of 03/25/18 10:04 AM. Always use your most recent med list.          ALPRAZolam 1 MG tablet Commonly known as:  XANAX Take 1 mg by mouth 4 (four) times daily.   citalopram 20 MG tablet Commonly known as:  CELEXA Take 20 mg by mouth daily.   enalapril 10 MG tablet Commonly known as:  VASOTEC Take 1 tablet (10 mg total) by mouth daily.   GOODY HEADACHE PO Take 1 packet by mouth every 12 (twelve) hours as needed. headache   ibuprofen 800 MG tablet Commonly known as:  ADVIL,MOTRIN Take 1 tablet (800 mg total) by mouth every 8 (eight) hours as needed.   norethindrone 0.35 MG tablet Commonly known as:  MICRONOR,CAMILA,ERRIN Take 1 tablet by mouth daily.   omeprazole 20 MG capsule Commonly known as:  PRILOSEC Take 1 capsule (20 mg total) by mouth daily.   propranolol 10 MG tablet Commonly known as:  INDERAL Take 10 mg by mouth daily.          Objective:    BP 130/87   Pulse 74   Temp 98.2 F (36.8 C) (Oral)   Ht 5\' 5"  (1.651 m)   Wt 175 lb (79.4 kg)   BMI 29.12 kg/m  Wt Readings from Last 3 Encounters:  03/25/18 175 lb (79.4 kg)  03/18/18 174 lb (78.9 kg)  06/13/17 162 lb (73.5 kg)    Physical Exam  Constitutional: She is oriented to person, place, and time. She appears well-developed and well-nourished. No distress.  Eyes: Conjunctivae are normal.  Musculoskeletal: Normal range of motion. She exhibits no edema.       Legs: Neurological: She is alert and oriented to person, place, and time. Coordination normal.  Skin: Skin is warm and dry. No rash noted. She is not diaphoretic.  Psychiatric: She has a normal mood and affect. Her behavior is normal.  Nursing note and vitals reviewed.       Assessment & Plan:   Problem List Items Addressed This Visit    None    Visit Diagnoses    Rupture of plantaris tendon, left, subsequent encounter    -  Primary       Continue stretching exercise and ibuprofen, will give 1 more week off work and see back next week if not better. Follow up plan: Return in about 1 week (around 04/01/2018), or if symptoms worsen or fail to improve, for Follow-up leg pain.  Counseling provided for all of the vaccine components No orders of the defined types were placed in this encounter.   Caryl Pina, MD [facility] 03/25/2018, 10:04 AM

## 2018-03-25 NOTE — Addendum Note (Signed)
Addended by: Michaela Corner on: 03/25/2018 10:25 AM   Modules accepted: Orders

## 2018-04-01 ENCOUNTER — Encounter: Payer: Self-pay | Admitting: Family Medicine

## 2018-04-01 ENCOUNTER — Ambulatory Visit: Payer: BLUE CROSS/BLUE SHIELD | Admitting: Family Medicine

## 2018-04-01 VITALS — BP 135/87 | HR 80 | Temp 99.3°F | Ht 65.0 in | Wt 176.0 lb

## 2018-04-01 DIAGNOSIS — S96812D Strain of other specified muscles and tendons at ankle and foot level, left foot, subsequent encounter: Secondary | ICD-10-CM

## 2018-04-01 NOTE — Progress Notes (Signed)
BP 135/87   Pulse 80   Temp 99.3 F (37.4 C) (Oral)   Ht 5\' 5"  (1.651 m)   Wt 176 lb (79.8 kg)   BMI 29.29 kg/m    Subjective:    Patient ID: Doris Newman, female    DOB: 1971-08-03, 47 y.o.   MRN: [id number]  HPI: Doris Newman is a 47 y.o. female presenting on 04/01/2018 for Follow up calf pain   HPI Left calf pain follow-up Patient is coming in for follow-up on left calf pain.  She has been out of work for the last 2 weeks.  She does feel like she has been improving significantly but still has a lot of pain especially with plantar flexion.  She denies any numbness or weakness but mainly just feels the pain in the upper part of her left calf.  She has continued to do the stretching and using anti-inflammatories but feels like she may need another week off work.  Relevant past medical, surgical, family and social history reviewed and updated as indicated. Interim medical history since our last visit reviewed. Allergies and medications reviewed and updated.  Review of Systems  Constitutional: Negative for chills and fever.  Eyes: Negative for visual disturbance.  Cardiovascular: Negative for chest pain and leg swelling.  Musculoskeletal: Positive for myalgias. Negative for back pain and gait problem.  Skin: Negative for rash.  Neurological: Negative for light-headedness and headaches.  Psychiatric/Behavioral: Negative for agitation and behavioral problems.  All other systems reviewed and are negative.   Per HPI unless specifically indicated above   Allergies as of 04/01/2018   No Known Allergies     Medication List        Accurate as of 04/01/18 11:28 AM. Always use your most recent med list.          ALPRAZolam 1 MG tablet Commonly known as:  XANAX Take 1 mg by mouth 4 (four) times daily.   citalopram 20 MG tablet Commonly known as:  CELEXA Take 20 mg by mouth daily.   enalapril 10 MG tablet Commonly known as:  VASOTEC Take 1 tablet (10 mg total)  by mouth daily.   GOODY HEADACHE PO Take 1 packet by mouth every 12 (twelve) hours as needed. headache   ibuprofen 800 MG tablet Commonly known as:  ADVIL,MOTRIN Take 1 tablet (800 mg total) by mouth every 8 (eight) hours as needed.   norethindrone 0.35 MG tablet Commonly known as:  MICRONOR,CAMILA,ERRIN Take 1 tablet by mouth daily.   omeprazole 20 MG capsule Commonly known as:  PRILOSEC Take 1 capsule (20 mg total) by mouth daily.   propranolol 10 MG tablet Commonly known as:  INDERAL Take 10 mg by mouth daily.          Objective:    BP 135/87   Pulse 80   Temp 99.3 F (37.4 C) (Oral)   Ht 5\' 5"  (1.651 m)   Wt 176 lb (79.8 kg)   BMI 29.29 kg/m   Wt Readings from Last 3 Encounters:  04/01/18 176 lb (79.8 kg)  03/25/18 175 lb (79.4 kg)  03/18/18 174 lb (78.9 kg)    Physical Exam  Constitutional: She is oriented to person, place, and time. She appears well-developed and well-nourished. No distress.  Eyes: Conjunctivae are normal.  Musculoskeletal: Normal range of motion.       Left lower leg: She exhibits tenderness.       Legs: Neurological: She is alert and oriented to person, place, and  time. Coordination normal.  Skin: Skin is warm and dry. No rash noted. She is not diaphoretic.  Psychiatric: She has a normal mood and affect. Her behavior is normal.  Nursing note and vitals reviewed.       Assessment & Plan:   Problem List Items Addressed This Visit    None    Visit Diagnoses    Rupture of plantaris tendon, left, subsequent encounter    -  Primary   Relevant Orders   Ambulatory referral to Physical Therapy      10 days off and pt referral  Follow up plan: Return if symptoms worsen or fail to improve.  Counseling provided for all of the vaccine components Orders Placed This Encounter  Procedures  . Ambulatory referral to Physical Therapy    Caryl Pina, MD [facility] 04/01/2018, 11:28 AM

## 2018-04-10 ENCOUNTER — Ambulatory Visit: Payer: BLUE CROSS/BLUE SHIELD | Admitting: Family Medicine

## 2018-04-10 ENCOUNTER — Encounter: Payer: Self-pay | Admitting: Family Medicine

## 2018-04-10 VITALS — BP 93/57 | HR 83 | Temp 97.5°F | Ht 65.0 in | Wt 176.8 lb

## 2018-04-10 DIAGNOSIS — S96812D Strain of other specified muscles and tendons at ankle and foot level, left foot, subsequent encounter: Secondary | ICD-10-CM | POA: Diagnosis not present

## 2018-04-10 NOTE — Progress Notes (Signed)
BP (!) 93/57   Pulse 83   Temp (!) 97.5 F (36.4 C) (Oral)   Ht 5\' 5"  (1.651 m)   Wt 176 lb 12.8 oz (80.2 kg)   BMI 29.42 kg/m    Subjective:    Patient ID: Doris Newman, female    DOB: Jul 29, 1971, 47 y.o.   MRN: [id number]  HPI: Doris Newman is a 47 y.o. female presenting on 04/10/2018 for Tendonitis (1 week follow-up, note under referral to PT needing something cheaper)   HPI Patient is coming in for follow-up of calf pain Patient is coming in today for follow-up of calf pain and plantaris tendon rupture.  She says she is doing a lot better and does not have any pain left in the calf.  She still does have a little bit of swelling in that left foot and wanted to get a little bit more time off work because of the swelling and pain there.  She has been ambulating fine but does say that because she is on her feet for 12 hours a day would like to just a little bit more time through the end of the week.  She denies any fevers or chills or redness or warmth.  She denies any pain in her calf any further.  She denies any swelling or redness or warmth.  Relevant past medical, surgical, family and social history reviewed and updated as indicated. Interim medical history since our last visit reviewed. Allergies and medications reviewed and updated.  Review of Systems  Constitutional: Negative for chills and fever.  Eyes: Negative for visual disturbance.  Respiratory: Negative for chest tightness and shortness of breath.   Cardiovascular: Positive for leg swelling. Negative for chest pain.  Musculoskeletal: Positive for myalgias. Negative for back pain and gait problem.  Skin: Negative for rash.  Neurological: Negative for light-headedness and headaches.  Psychiatric/Behavioral: Negative for agitation and behavioral problems.  All other systems reviewed and are negative.   Per HPI unless specifically indicated above   Allergies as of 04/10/2018   No Known Allergies       Medication List        Accurate as of 04/10/18 12:12 PM. Always use your most recent med list.          ALPRAZolam 1 MG tablet Commonly known as:  XANAX Take 1 mg by mouth 4 (four) times daily.   citalopram 20 MG tablet Commonly known as:  CELEXA Take 20 mg by mouth daily.   enalapril 10 MG tablet Commonly known as:  VASOTEC Take 1 tablet (10 mg total) by mouth daily.   GOODY HEADACHE PO Take 1 packet by mouth every 12 (twelve) hours as needed. headache   ibuprofen 800 MG tablet Commonly known as:  ADVIL,MOTRIN Take 1 tablet (800 mg total) by mouth every 8 (eight) hours as needed.   norethindrone 0.35 MG tablet Commonly known as:  MICRONOR,CAMILA,ERRIN Take 1 tablet by mouth daily.   omeprazole 20 MG capsule Commonly known as:  PRILOSEC Take 1 capsule (20 mg total) by mouth daily.   propranolol 10 MG tablet Commonly known as:  INDERAL Take 10 mg by mouth daily.          Objective:    BP (!) 93/57   Pulse 83   Temp (!) 97.5 F (36.4 C) (Oral)   Ht 5\' 5"  (1.651 m)   Wt 176 lb 12.8 oz (80.2 kg)   BMI 29.42 kg/m   Wt Readings from Last 3 Encounters:  04/10/18 176 lb 12.8 oz (80.2 kg)  04/01/18 176 lb (79.8 kg)  03/25/18 175 lb (79.4 kg)    Physical Exam  Constitutional: She is oriented to person, place, and time. She appears well-developed and well-nourished. No distress.  Eyes: Conjunctivae are normal.  Cardiovascular: Normal rate, regular rhythm, normal heart sounds and intact distal pulses.  No murmur heard. Pulmonary/Chest: Effort normal and breath sounds normal. No respiratory distress. She has no wheezes.  Musculoskeletal: Normal range of motion. She exhibits edema (Trace edema and left lower extremity). She exhibits no tenderness (Patient does not have any further calf tenderness and has a normal gait today.).  Neurological: She is alert and oriented to person, place, and time. Coordination normal.  Skin: Skin is warm and dry. No rash noted. She  is not diaphoretic.  Psychiatric: She has a normal mood and affect. Her behavior is normal.  Nursing note and vitals reviewed.       Assessment & Plan:   Problem List Items Addressed This Visit    None    Visit Diagnoses    Rupture of plantaris tendon, left, subsequent encounter    -  Primary   Patient's pain is resolved and she is walking normally, she still has a little swelling and wants a few more days off       Follow up plan: Return if symptoms worsen or fail to improve.  Counseling provided for all of the vaccine components No orders of the defined types were placed in this encounter.   Caryl Pina, MD [facility] 04/10/2018, 12:12 PM

## 2018-04-22 ENCOUNTER — Ambulatory Visit: Payer: Self-pay | Admitting: Family Medicine

## 2018-04-29 ENCOUNTER — Telehealth: Payer: Self-pay | Admitting: Family Medicine

## 2018-04-29 NOTE — Telephone Encounter (Signed)
Work note faxed, patient aware

## 2018-04-29 NOTE — Telephone Encounter (Signed)
PT is needing [place] to resend letter to where she can return back to work to [employer] [phone]  We gave her a letter but she has tore it up, please call pt once this has been faxed

## 2018-04-29 NOTE — Telephone Encounter (Signed)
Please put on note that has no restrictions  That needs to be on there

## 2018-08-08 ENCOUNTER — Other Ambulatory Visit: Payer: Self-pay | Admitting: Family Medicine

## 2018-11-07 ENCOUNTER — Other Ambulatory Visit: Payer: Self-pay | Admitting: Family Medicine

## 2018-11-07 NOTE — Telephone Encounter (Signed)
Last seen 04/10/18

## 2019-02-03 ENCOUNTER — Other Ambulatory Visit: Payer: Self-pay | Admitting: Family Medicine

## 2019-02-04 NOTE — Telephone Encounter (Signed)
Last seen 04/10/18

## 2019-03-07 ENCOUNTER — Encounter (INDEPENDENT_AMBULATORY_CARE_PROVIDER_SITE_OTHER): Payer: Self-pay

## 2019-03-07 ENCOUNTER — Telehealth: Payer: Self-pay | Admitting: Family Medicine

## 2019-05-02 ENCOUNTER — Other Ambulatory Visit: Payer: Self-pay | Admitting: Family Medicine

## 2019-06-26 DIAGNOSIS — F3342 Major depressive disorder, recurrent, in full remission: Secondary | ICD-10-CM | POA: Diagnosis not present

## 2019-07-31 ENCOUNTER — Other Ambulatory Visit: Payer: Self-pay | Admitting: Family Medicine

## 2019-09-10 ENCOUNTER — Ambulatory Visit: Payer: BLUE CROSS/BLUE SHIELD | Admitting: Family Medicine

## 2019-10-15 ENCOUNTER — Ambulatory Visit: Payer: BC Managed Care – PPO | Attending: Internal Medicine

## 2019-10-15 ENCOUNTER — Other Ambulatory Visit: Payer: Self-pay

## 2019-10-15 DIAGNOSIS — Z20822 Contact with and (suspected) exposure to covid-19: Secondary | ICD-10-CM

## 2019-10-16 LAB — NOVEL CORONAVIRUS, NAA: SARS-CoV-2, NAA: NOT DETECTED

## 2019-10-29 ENCOUNTER — Other Ambulatory Visit: Payer: Self-pay | Admitting: Family Medicine

## 2019-10-29 NOTE — Telephone Encounter (Signed)
[name]. NTBS LOV 04/10/18

## 2019-10-29 NOTE — Telephone Encounter (Signed)
LMTCB to schedule appointment °

## 2019-11-04 DIAGNOSIS — Z3041 Encounter for surveillance of contraceptive pills: Secondary | ICD-10-CM | POA: Diagnosis not present

## 2019-11-04 DIAGNOSIS — Z01419 Encounter for gynecological examination (general) (routine) without abnormal findings: Secondary | ICD-10-CM | POA: Diagnosis not present

## 2019-11-04 DIAGNOSIS — Z3009 Encounter for other general counseling and advice on contraception: Secondary | ICD-10-CM | POA: Diagnosis not present

## 2019-11-04 DIAGNOSIS — Z113 Encounter for screening for infections with a predominantly sexual mode of transmission: Secondary | ICD-10-CM | POA: Diagnosis not present

## 2019-11-10 ENCOUNTER — Ambulatory Visit: Payer: BC Managed Care – PPO | Admitting: Family Medicine

## 2019-11-26 ENCOUNTER — Other Ambulatory Visit: Payer: Self-pay

## 2019-11-27 ENCOUNTER — Encounter: Payer: Self-pay | Admitting: Family Medicine

## 2019-11-27 ENCOUNTER — Ambulatory Visit: Payer: BC Managed Care – PPO | Admitting: Family Medicine

## 2019-11-27 VITALS — BP 144/98 | HR 77 | Temp 97.5°F | Ht 65.0 in | Wt 179.6 lb

## 2019-11-27 DIAGNOSIS — I83813 Varicose veins of bilateral lower extremities with pain: Secondary | ICD-10-CM

## 2019-11-27 DIAGNOSIS — I10 Essential (primary) hypertension: Secondary | ICD-10-CM

## 2019-11-27 DIAGNOSIS — R609 Edema, unspecified: Secondary | ICD-10-CM

## 2019-11-27 MED ORDER — HYDROCHLOROTHIAZIDE 25 MG PO TABS: 25.0000 mg | ORAL_TABLET | Freq: Every day | ORAL | 3 refills | Status: DC

## 2019-11-27 MED ORDER — OMEPRAZOLE 20 MG PO CPDR: 20.0000 mg | DELAYED_RELEASE_CAPSULE | Freq: Every day | ORAL | 1 refills | Status: DC

## 2019-11-27 MED ORDER — ENALAPRIL MALEATE 10 MG PO TABS: 10.0000 mg | ORAL_TABLET | Freq: Every day | ORAL | 1 refills | Status: DC

## 2019-11-27 NOTE — Addendum Note (Signed)
Addended by: Caryl Pina on: 11/27/2019 12:15 PM   Modules accepted: Orders

## 2019-11-27 NOTE — Progress Notes (Signed)
BP (!) 144/98   Pulse 77   Temp (!) 97.5 F (36.4 C) (Temporal)   Ht 5' 5" (1.651 m)   Wt 179 lb 9.6 oz (81.5 kg)   SpO2 98%   BMI 29.89 kg/m    Subjective:   Patient ID: Doris Newman, female    DOB: 07-May-1971, 49 y.o.   MRN: [id number]  HPI: Doris Newman is a 49 y.o. female presenting on 11/27/2019 for Foot Swelling (bilateral feet swelling that have been on and off for months )   HPI Patient is coming in today with complaints of foot pain and swelling that is been going on and off over the past few months.  She is also had some blisters on the left foot, the left leg swells more than the right leg.  She also has more varicose veins in the left leg than the right leg.  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  She does have a lot of varicose veins in her lower leg on the left and she gets some pain when they get swollen as well.  She has pictures of showing how she gets a blister in the center of the bottom of her foot sometimes, she does admit that she wears old shoes to work and she will consider getting some new inserts to help with that.  The blister is resolved today.  Relevant past medical, surgical, family and social history reviewed and updated as indicated. Interim medical history since our last visit reviewed. Allergies and medications reviewed and updated.  Review of Systems  Constitutional: Negative for chills and fever.  Eyes: Negative for visual disturbance.  Respiratory: Negative for chest tightness and shortness of breath.   Cardiovascular: Positive for leg swelling. Negative for chest pain.  Musculoskeletal: Negative for back pain and gait problem.  Skin: Negative for color change, rash and wound.  Neurological: Negative for light-headedness and headaches.  Psychiatric/Behavioral: Negative for agitation and behavioral problems.  All  other systems reviewed and are negative.   Per HPI unless specifically indicated above   Allergies as of 11/27/2019   No Known Allergies     Medication List       Accurate as of November 27, 2019 12:09 PM. If you have any questions, ask your nurse or doctor.        ALPRAZolam 1 MG tablet Commonly known as: XANAX Take 1 mg by mouth 4 (four) times daily.   citalopram 20 MG tablet Commonly known as: CELEXA Take 20 mg by mouth daily.   enalapril 10 MG tablet Commonly known as: VASOTEC TAKE ONE (1) TABLET EACH DAY   GOODY HEADACHE PO Take 1 packet by mouth every 12 (twelve) hours as needed. headache   hydrochlorothiazide 25 MG tablet Commonly known as: HYDRODIURIL Take 1 tablet (25 mg total) by mouth daily. Started by: Worthy Rancher, MD   ibuprofen 800 MG tablet Commonly known as: ADVIL Take 1 tablet (800 mg total) by mouth every 8 (eight) hours as needed.   norethindrone 0.35 MG tablet Commonly known as: MICRONOR Take 1 tablet by mouth daily.   omeprazole 20 MG capsule Commonly known as: PRILOSEC TAKE ONE (1) CAPSULE EACH DAY   propranolol 10 MG tablet Commonly known as: INDERAL Take 10 mg by mouth daily.        Objective:   BP Marland Kitchen)  144/98   Pulse 77   Temp (!) 97.5 F (36.4 C) (Temporal)   Ht 5' 5" (1.651 m)   Wt 179 lb 9.6 oz (81.5 kg)   SpO2 98%   BMI 29.89 kg/m   Wt Readings from Last 3 Encounters:  11/27/19 179 lb 9.6 oz (81.5 kg)  04/10/18 176 lb 12.8 oz (80.2 kg)  04/01/18 176 lb (79.8 kg)    Physical Exam Vitals and nursing note reviewed.  Constitutional:      General: She is not in acute distress.    Appearance: She is well-developed. She is not diaphoretic.  Eyes:     Conjunctiva/sclera: Conjunctivae normal.     Pupils: Pupils are equal, round, and reactive to light.  Cardiovascular:     Rate and Rhythm: Normal rate and regular rhythm.     Heart sounds: Normal heart sounds. No murmur.  Pulmonary:     Effort: Pulmonary  effort is normal. No respiratory distress.     Breath sounds: Normal breath sounds. No wheezing.  Musculoskeletal:        General: No swelling (No swelling today on exam) or tenderness. Normal range of motion.  Skin:    General: Skin is warm and dry.     Findings: No rash.     Comments: Healed area in the center of her foot where the blister was previously, no induration or fluctuation or tenderness or erythema.  Neurological:     Mental Status: She is alert and oriented to person, place, and time.     Coordination: Coordination normal.  Psychiatric:        Behavior: Behavior normal.       Assessment & Plan:   Problem List Items Addressed This Visit      Cardiovascular and Mediastinum   Essential hypertension - Primary   Relevant Medications   hydrochlorothiazide (HYDRODIURIL) 25 MG tablet   Other Relevant Orders   CMP14+EGFR   Lipid panel    Other Visit Diagnoses    Varicose veins of both lower extremities with pain       Relevant Medications   hydrochlorothiazide (HYDRODIURIL) 25 MG tablet   Other Relevant Orders   CBC with Differential/Platelet   Peripheral edema       Relevant Medications   hydrochlorothiazide (HYDRODIURIL) 25 MG tablet   Other Relevant Orders   CMP14+EGFR   CBC with Differential/Platelet    Will add hydrochlorothiazide for both blood pressure and swelling, she is to continue compression stockings.  Follow up plan: Return in about 5 weeks (around 01/01/2020), or if symptoms worsen or fail to improve, for Blood pressure recheck.  Counseling provided for all of the vaccine components Orders Placed This Encounter  Procedures  . CMP14+EGFR  . CBC with Differential/Platelet  . Lipid panel    Caryl Pina, MD [facility] 11/27/2019, 12:09 PM

## 2019-11-27 NOTE — Patient Instructions (Signed)
Come back in 1 to 2 months for blood pressure recheck and recheck swelling

## 2019-11-28 LAB — CMP14+EGFR
ALT: 23 IU/L (ref 0–32)
AST: 20 IU/L (ref 0–40)
Albumin/Globulin Ratio: 1.7 (ref 1.2–2.2)
Albumin: 4 g/dL (ref 3.8–4.8)
Alkaline Phosphatase: 69 IU/L (ref 39–117)
BUN/Creatinine Ratio: 14 (ref 9–23)
BUN: 14 mg/dL (ref 6–24)
Bilirubin Total: 0.4 mg/dL (ref 0.0–1.2)
CO2: 22 mmol/L (ref 20–29)
Calcium: 9.4 mg/dL (ref 8.7–10.2)
Chloride: 103 mmol/L (ref 96–106)
Creatinine, Ser: 1.02 mg/dL — ABNORMAL HIGH (ref 0.57–1.00)
GFR calc Af Amer: 75 mL/min/{1.73_m2} (ref >59)
GFR calc non Af Amer: 65 mL/min/{1.73_m2} (ref >59)
Globulin, Total: 2.4 g/dL (ref 1.5–4.5)
Glucose: 80 mg/dL (ref 65–99)
Potassium: 4.1 mmol/L (ref 3.5–5.2)
Sodium: 138 mmol/L (ref 134–144)
Total Protein: 6.4 g/dL (ref 6.0–8.5)

## 2019-11-28 LAB — CBC WITH DIFFERENTIAL/PLATELET
Basophils Absolute: 0 10*3/uL (ref 0.0–0.2)
Basos: 1 %
EOS (ABSOLUTE): 0.2 10*3/uL (ref 0.0–0.4)
Eos: 3 %
Hematocrit: 38.2 % (ref 34.0–46.6)
Hemoglobin: 13 g/dL (ref 11.1–15.9)
Immature Grans (Abs): 0 10*3/uL (ref 0.0–0.1)
Immature Granulocytes: 0 %
Lymphocytes Absolute: 2.6 10*3/uL (ref 0.7–3.1)
Lymphs: 39 %
MCH: 30.9 pg (ref 26.6–33.0)
MCHC: 34 g/dL (ref 31.5–35.7)
MCV: 91 fL (ref 79–97)
Monocytes Absolute: 0.7 10*3/uL (ref 0.1–0.9)
Monocytes: 10 %
Neutrophils Absolute: 3.1 10*3/uL (ref 1.4–7.0)
Neutrophils: 47 %
Platelets: 324 10*3/uL (ref 150–450)
RBC: 4.21 x10E6/uL (ref 3.77–5.28)
RDW: 13.1 % (ref 11.7–15.4)
WBC: 6.6 10*3/uL (ref 3.4–10.8)

## 2019-11-28 LAB — LIPID PANEL
Chol/HDL Ratio: 3.6 ratio (ref 0.0–4.4)
Cholesterol, Total: 196 mg/dL (ref 100–199)
HDL: 54 mg/dL (ref >39)
LDL Chol Calc (NIH): 128 mg/dL — ABNORMAL HIGH (ref 0–99)
Triglycerides: 77 mg/dL (ref 0–149)
VLDL Cholesterol Cal: 14 mg/dL (ref 5–40)

## 2019-12-29 ENCOUNTER — Ambulatory Visit: Payer: BC Managed Care – PPO | Admitting: Family Medicine

## 2019-12-31 DIAGNOSIS — Z6828 Body mass index (BMI) 28.0-28.9, adult: Secondary | ICD-10-CM | POA: Diagnosis not present

## 2019-12-31 DIAGNOSIS — M549 Dorsalgia, unspecified: Secondary | ICD-10-CM | POA: Diagnosis not present

## 2019-12-31 DIAGNOSIS — M5136 Other intervertebral disc degeneration, lumbar region: Secondary | ICD-10-CM | POA: Diagnosis not present

## 2020-01-01 ENCOUNTER — Telehealth: Payer: Self-pay | Admitting: Family Medicine

## 2020-01-01 NOTE — Telephone Encounter (Signed)
Appointment scheduled tomorrow, 01/02/20, at 10:25 am with [facility].

## 2020-01-02 ENCOUNTER — Other Ambulatory Visit: Payer: Self-pay

## 2020-01-02 ENCOUNTER — Ambulatory Visit: Payer: BC Managed Care – PPO | Admitting: Family

## 2020-01-02 ENCOUNTER — Encounter: Payer: Self-pay | Admitting: Family

## 2020-01-02 VITALS — BP 130/90 | HR 82 | Temp 97.8°F | Ht 65.0 in | Wt 176.8 lb

## 2020-01-02 DIAGNOSIS — M545 Low back pain, unspecified: Secondary | ICD-10-CM

## 2020-01-02 MED ORDER — DICLOFENAC SODIUM 75 MG PO TBEC: 75.0000 mg | DELAYED_RELEASE_TABLET | Freq: Two times a day (BID) | ORAL | 0 refills | Status: DC

## 2020-01-02 MED ORDER — BACLOFEN 10 MG PO TABS: 10.0000 mg | ORAL_TABLET | Freq: Three times a day (TID) | ORAL | 0 refills | Status: DC

## 2020-01-02 MED ORDER — PREDNISONE 10 MG (21) PO TBPK: ORAL_TABLET | ORAL | 0 refills | Status: DC

## 2020-01-02 NOTE — Progress Notes (Signed)
Subjective:    Patient ID: Doris Newman, female    DOB: [birth year], 49 y.o.   MRN: [id number]  Chief Complaint  Patient presents with  . Back Pain    [facility]. Snezzed Wedsenday and hurt it. Wants work note    Pt presents to the office today with acute back pain. She reports she sneezed on Wednesday and felt "something" and went to her knees in pain. She went to the Urgent Care and had negative x-ray and was given steroid injection. She reports she is feeling slightly better.  Back Pain This is a new problem. The current episode started in the past 7 days. The problem occurs intermittently. The problem has been waxing and waning since onset. The pain is present in the lumbar spine. The quality of the pain is described as aching. The pain does not radiate. The pain is at a severity of 8/10. The pain is moderate. The symptoms are aggravated by bending and twisting. Pertinent negatives include no bladder incontinence, bowel incontinence, leg pain, tingling or weakness. She has tried NSAIDs for the symptoms. The treatment provided mild relief.      Review of Systems  Gastrointestinal: Negative for bowel incontinence.  Genitourinary: Negative for bladder incontinence.  Musculoskeletal: Positive for back pain.  Neurological: Negative for tingling and weakness.  All other systems reviewed and are negative.      Objective:   Physical Exam Vitals reviewed.  Constitutional:      General: She is not in acute distress.    Appearance: She is well-developed.  HENT:     Head: Normocephalic and atraumatic.  Eyes:     Pupils: Pupils are equal, round, and reactive to light.  Neck:     Thyroid: No thyromegaly.  Cardiovascular:     Rate and Rhythm: Normal rate and regular rhythm.     Heart sounds: Normal heart sounds. No murmur.  Pulmonary:     Effort: Pulmonary effort is normal. No respiratory distress.     Breath sounds: Normal breath sounds. No wheezing.  Abdominal:   General: Bowel sounds are normal. There is no distension.     Palpations: Abdomen is soft.     Tenderness: There is no abdominal tenderness.  Musculoskeletal:        General: No tenderness.     Cervical back: Normal range of motion and neck supple.  Skin:    General: Skin is warm and dry.  Neurological:     Mental Status: She is alert and oriented to person, place, and time.     Cranial Nerves: No cranial nerve deficit.     Deep Tendon Reflexes: Reflexes are normal and symmetric.  Psychiatric:        Behavior: Behavior normal.        Thought Content: Thought content normal.        Judgment: Judgment normal.      BP 130/90   Pulse 82   Temp 97.8 F (36.6 C) (Temporal)   Ht 5\' 5"  (1.651 m)   Wt 176 lb 12.8 oz (80.2 kg)   SpO2 97%   BMI 29.42 kg/m       Assessment & Plan:  Doris Newman comes in today with chief complaint of Back Pain ([facility]. Sneezed Wednesday and hurt it. Wants work note )   Diagnosis and orders addressed:  1. Acute bilateral low back pain without sciatica Rest Ice  ROM exercises  Sedation precautions discussed No other NSAID's while taking diclofenac  RTO as needed or if symptoms worsen or do not improve  - baclofen (LIORESAL) 10 MG tablet; Take 1 tablet (10 mg total) by mouth 3 (three) times daily.  Dispense: 30 each; Refill: 0 - diclofenac (VOLTAREN) 75 MG EC tablet; Take 1 tablet (75 mg total) by mouth 2 (two) times daily.  Dispense: 30 tablet; Refill: 0 - predniSONE (STERAPRED UNI-PAK 21 TAB) 10 MG (21) TBPK tablet; Use as directed  Dispense: 21 tablet; Refill: 0   Evelina Dun, FNP

## 2020-01-02 NOTE — Patient Instructions (Signed)
Acute Back Pain, Adult Acute back pain is sudden and usually short-lived. It is often caused by an injury to the muscles and tissues in the back. The injury may result from:  A muscle or ligament getting overstretched or torn (strained). Ligaments are tissues that connect bones to each other. Lifting something improperly can cause a back strain.  Wear and tear (degeneration) of the spinal disks. Spinal disks are circular tissue that provides cushioning between the bones of the spine (vertebrae).  Twisting motions, such as while playing sports or doing yard work.  A hit to the back.  Arthritis. You may have a physical exam, lab tests, and imaging tests to find the cause of your pain. Acute back pain usually goes away with rest and home care. Follow these instructions at home: Managing pain, stiffness, and swelling  Take over-the-counter and prescription medicines only as told by your health care provider.  Your health care provider may recommend applying ice during the first 24-48 hours after your pain starts. To do this: ? Put ice in a plastic bag. ? Place a towel between your skin and the bag. ? Leave the ice on for 20 minutes, 2-3 times a day.  If directed, apply heat to the affected area as often as told by your health care provider. Use the heat source that your health care provider recommends, such as a moist heat pack or a heating pad. ? Place a towel between your skin and the heat source. ? Leave the heat on for 20-30 minutes. ? Remove the heat if your skin turns bright red. This is especially important if you are unable to feel pain, heat, or cold. You have a greater risk of getting burned. Activity   Do not stay in bed. Staying in bed for more than 1-2 days can delay your recovery.  Sit up and stand up straight. Avoid leaning forward when you sit, or hunching over when you stand. ? If you work at a desk, sit close to it so you do not need to lean over. Keep your chin tucked  in. Keep your neck drawn back, and keep your elbows bent at a right angle. Your arms should look like the letter "L." ? Sit high and close to the steering wheel when you drive. Add lower back (lumbar) support to your car seat, if needed.  Take short walks on even surfaces as soon as you are able. Try to increase the length of time you walk each day.  Do not sit, drive, or stand in one place for more than 30 minutes at a time. Sitting or standing for long periods of time can put stress on your back.  Do not drive or use heavy machinery while taking prescription pain medicine.  Use proper lifting techniques. When you bend and lift, use positions that put less stress on your back: ? Bend your knees. ? Keep the load close to your body. ? Avoid twisting.  Exercise regularly as told by your health care provider. Exercising helps your back heal faster and helps prevent back injuries by keeping muscles strong and flexible.  Work with a physical therapist to make a safe exercise program, as recommended by your health care provider. Do any exercises as told by your physical therapist. Lifestyle  Maintain a healthy weight. Extra weight puts stress on your back and makes it difficult to have good posture.  Avoid activities or situations that make you feel anxious or stressed. Stress and anxiety increase muscle   tension and can make back pain worse. Learn ways to manage anxiety and stress, such as through exercise. General instructions  Sleep on a firm mattress in a comfortable position. Try lying on your side with your knees slightly bent. If you lie on your back, put a pillow under your knees.  Follow your treatment plan as told by your health care provider. This may include: ? Cognitive or behavioral therapy. ? Acupuncture or massage therapy. ? Meditation or yoga. Contact a health care provider if:  You have pain that is not relieved with rest or medicine.  You have increasing pain going down  into your legs or buttocks.  Your pain does not improve after 2 weeks.  You have pain at night.  You lose weight without trying.  You have a fever or chills. Get help right away if:  You develop new bowel or bladder control problems.  You have unusual weakness or numbness in your arms or legs.  You develop nausea or vomiting.  You develop abdominal pain.  You feel faint. Summary  Acute back pain is sudden and usually short-lived.  Use proper lifting techniques. When you bend and lift, use positions that put less stress on your back.  Take over-the-counter and prescription medicines and apply heat or ice as directed by your health care provider. This information is not intended to replace advice given to you by your health care provider. Make sure you discuss any questions you have with your health care provider. Document Revised: 01/21/2019 Document Reviewed: 05/16/2017 Elsevier Patient Education  2020 [employer]  

## 2020-01-08 ENCOUNTER — Ambulatory Visit: Payer: BC Managed Care – PPO | Admitting: Family Medicine

## 2020-01-13 DIAGNOSIS — Z1231 Encounter for screening mammogram for malignant neoplasm of breast: Secondary | ICD-10-CM | POA: Diagnosis not present

## 2020-01-14 LAB — HM MAMMOGRAPHY

## 2020-01-15 ENCOUNTER — Other Ambulatory Visit: Payer: Self-pay | Admitting: *Deleted

## 2020-01-15 MED ORDER — OMEPRAZOLE 20 MG PO CPDR: 20.0000 mg | DELAYED_RELEASE_CAPSULE | Freq: Every day | ORAL | 1 refills | Status: DC

## 2020-01-15 MED ORDER — ENALAPRIL MALEATE 10 MG PO TABS: 10.0000 mg | ORAL_TABLET | Freq: Every day | ORAL | 1 refills | Status: DC

## 2020-01-19 ENCOUNTER — Other Ambulatory Visit: Payer: Self-pay | Admitting: *Deleted

## 2020-01-19 DIAGNOSIS — R609 Edema, unspecified: Secondary | ICD-10-CM

## 2020-01-19 DIAGNOSIS — I83813 Varicose veins of bilateral lower extremities with pain: Secondary | ICD-10-CM

## 2020-01-19 DIAGNOSIS — I10 Essential (primary) hypertension: Secondary | ICD-10-CM

## 2020-01-19 MED ORDER — HYDROCHLOROTHIAZIDE 25 MG PO TABS: 25.0000 mg | ORAL_TABLET | Freq: Every day | ORAL | 0 refills | Status: DC

## 2020-02-04 DIAGNOSIS — Z170 Estrogen receptor positive status [ER+]: Secondary | ICD-10-CM | POA: Diagnosis not present

## 2020-02-04 DIAGNOSIS — C50412 Malignant neoplasm of upper-outer quadrant of left female breast: Secondary | ICD-10-CM | POA: Diagnosis not present

## 2020-02-04 DIAGNOSIS — N6489 Other specified disorders of breast: Secondary | ICD-10-CM | POA: Diagnosis not present

## 2020-02-04 DIAGNOSIS — N6321 Unspecified lump in the left breast, upper outer quadrant: Secondary | ICD-10-CM | POA: Diagnosis not present

## 2020-02-04 DIAGNOSIS — R928 Other abnormal and inconclusive findings on diagnostic imaging of breast: Secondary | ICD-10-CM | POA: Diagnosis not present

## 2020-02-19 ENCOUNTER — Ambulatory Visit: Payer: BC Managed Care – PPO | Admitting: General Surgery

## 2020-02-19 ENCOUNTER — Encounter: Payer: Self-pay | Admitting: General Surgery

## 2020-02-19 ENCOUNTER — Other Ambulatory Visit: Payer: Self-pay

## 2020-02-19 VITALS — BP 102/67 | HR 82 | Temp 97.8°F | Resp 12 | Ht 66.0 in | Wt 173.0 lb

## 2020-02-19 DIAGNOSIS — C50912 Malignant neoplasm of unspecified site of left female breast: Secondary | ICD-10-CM | POA: Diagnosis not present

## 2020-02-19 NOTE — Patient Instructions (Addendum)
Cardiology referral for chest pain.  Will get information about scheduling surgery by sometime next week.   Breast Cancer, Female  Breast cancer is a malignant growth of tissue (tumor) in the breast. Unlike noncancerous (benign) tumors, malignant tumors are cancerous and can spread to other parts of the body. The two most common types of breast cancer start in the milk ducts (ductal carcinoma) or in the lobules where milk is made in the breast (lobular carcinoma). Breast cancer is one of the most common types of cancer in women. What are the causes? The exact cause of female breast cancer is unknown. What increases the risk? The following factors may make you more likely to develop this condition:  Being older than 49 years of age.  Race and ethnicity. Caucasian women generally have an increased risk, but African-American women are more likely to develop the disease before age 54.  Having a family history of breast cancer.  Having had breast cancer in the past.  Having certain noncancerous conditions of the breast, such as dense breast tissue.  Having the BRCA1 and BRCA2 genes.  Having a history of radiation exposure.  Obesity.  Starting menopause after age 26.  Starting your menstrual periods before age 22.  Having never been pregnant or having your first child after age 41.  Having never breastfed.  Using hormone therapy after menopause.  Using birth control pills.  Drinking more than one alcoholic drink a day.  Exposure to the drug DES, which was given to pregnant women from the 1940s to the 1970s. What are the signs or symptoms? Symptoms of this condition include:  A painless lump or thickening in your breast.  Changes in the size or shape of your breast.  Breast skin changes, such as puckering or dimpling.  Nipple abnormalities, such as scaling, crustiness, redness, or pulling in (retraction).  Nipple discharge that is bloody or clear. How is this  diagnosed? This condition may be diagnosed by:  Taking your medical history and doing a physical exam. During the exam, your health care provider will feel the tissue around your breast and under your arms.  Taking a sample of nipple discharge. The sample will be examined under a microscope.  Performing imaging tests, such as breast X-rays (mammogram), breast ultrasound exams, or an MRI.  Taking a tissue sample (biopsy) from the breast. The sample will be examined under a microscope to look for cancer cells.  Taking a sample from the lymph nodes near the affected breast (sentinel node biopsy). Your cancer will be staged to determine its severity and extent. Staging is a careful attempt to find out the size of the tumor, whether the cancer has spread, and if so, to what parts of the body. Staging also includes testing your tumor for certain receptors, such as estrogen, progesterone, and human epidermal growth factor receptor 2 (HER2). This will help your cancer care team decide on a treatment that will work best for you. You may need to have more tests to determine the stage of your cancer. Stages include the following:  Stage 0--The tumor has not spread to other breast tissue.  Stage I--The cancer is only found in the breast or may be in the lymph nodes. The tumor may be up to  in (2 cm) wide.  Stage II--The cancer has spread to nearby lymph nodes. The tumor may be up to 2 in (5 cm) wide.  Stage III--The cancer has spread to more distant lymph nodes. The tumor may  be larger than 2 in (5 cm) wide.  Stage IV--The cancer has spread to other parts of the body, such as the bones, brain, liver, or lungs. How is this treated? Treatment for this condition depends on the type and stage of the breast cancer. It may be treated with:  Surgery. This may involve breast-conserving surgery (lumpectomy or partial mastectomy) in which only the part of the breast containing the cancer is removed. Some normal  tissue surrounding this area may also be removed. In some cases, surgery may be done to remove the entire breast (mastectomy) and nipple. Lymph nodes may also be removed.  Radiation therapy, which uses high-energy rays to kill cancer cells.  Chemotherapy, which is the use of drugs to kill cancer cells.  Hormone therapy, which involves taking medicine to adjust the hormone levels in your body. You may take medicine to decrease your estrogen levels. This can help stop cancer cells from growing.  Targeted therapy, in which drugs are used to block the growth and spread of cancer cells. These drugs target a specific part of the cancer cell and usually cause fewer side effects than chemotherapy. Targeted therapy may be used alone or in combination with chemotherapy.  A combination of surgery, radiation, chemotherapy, or hormone therapy may be needed to treat breast cancer. Follow these instructions at home:  Take over-the-counter and prescription medicines only as told by your health care provider.  Eat a healthy diet. A healthy diet includes lots of fruits and vegetables, low-fat dairy products, lean meats, and fiber. ? Make sure half your plate is filled with fruits or vegetables. ? Choose high-fiber foods such as whole-grain breads and cereals.  Consider joining a support group. This may help you learn to cope with the stress of having breast cancer.  Talk to your health care team about exercise and physical activity. The right exercise program can: ? Help prevent or reduce symptoms such as fatigue or depression. ? Improve overall health and survival rates.  Keep all follow-up visits as told by your health care provider. This is important. Where to find more information  American Cancer Society: www.cancer.[place]: www.cancer.gov Contact a health care provider if:  You have a sudden increase in pain.  You have any symptoms or changes that concern you.  You  lose weight without trying.  You notice a new lump in either breast or under your arm.  You develop swelling in either arm or hand.  You have a fever.  You notice new fatigue or weakness. Get help right away if:  You have chest pain or trouble breathing.  You faint. Summary  Breast cancer is a malignant growth of tissue (tumor) in the breast.  Your cancer will be staged to determine its severity and extent.  Treatment for this condition depends on the type and stage of the breast cancer. This information is not intended to replace advice given to you by your health care provider. Make sure you discuss any questions you have with your health care provider. Document Revised: 09/14/2017 Document Reviewed: 05/28/2017 Elsevier Patient Education  2020 [place].   Surgical Options for Early-Stage Breast Cancer  Surgery is usually the first treatment for early-stage breast cancer. Most women have two surgery options. One is called partial mastectomy, or breast-sparing or breast-conserving surgery, and the other is called mastectomy. Both surgeries have good survival rates. Breast cancer is different for everyone, even in its early stage. The best treatment for one person  might not be the best treatment for another. Learn as much as you can about your cancer and work closely with your health care providers to make the choice that produces the best results for you. What is partial mastectomy? Partial mastectomy, also called breast-sparing surgery or breast-conserving surgery, is surgery to remove the cancer along with some normal breast tissue that surrounds it. Lymph nodes from under the arm may also be removed and tested to find out if the cancer has spread. If cancer is located near the chest wall, part of the chest wall lining may also be removed. What is a mastectomy? A mastectomy is surgery to remove the cancer along with the entire breast tissue. There are several types of  mastectomy:  Simple or total mastectomy. In this surgery the entire breast is removed, including breast tissue, nipple, areola and skin around the breast. Some lymph nodes may also be removed from under the arm. If cancer is located near the chest wall, part of the chest wall lining may also be removed.  Skin-sparing mastectomy. In this surgery the breast tissue, nipple, and areola are removed and most of the skin over the breast is left in place. This surgery results in less scar tissue than other mastectomy surgeries, which allows for a more natural breast reconstruction.  Nipple-sparing mastectomy. In this surgery, breast tissue is removed but the skin and nipple is left in place. The tissue under the nipple and areola may be removed if cancer is found in the area. This may be an option for women who choose to have breast reconstruction after mastectomy.  Modified radical mastectomy. This surgery is the same as a simple mastectomy but also includes removing lymph nodes from under the arm (axillary lymph node dissection).  Radical mastectomy. In this surgery the entire breast, the lymph nodes under the arm, and the chest wall muscles under the breast are removed. This surgery is rarely done now. A modified radical mastectomy is preferred because it is just as effective, but with the added advantage of fewer side effects. What are some advantages and disadvantages of these surgeries? Partial mastectomy Advantages of partial mastectomy include:  Keeping most of your breast tissue intact, allowing for a more natural look to the breast.  Easier recovery when compared to a mastectomy.  Ability to go home on the day of the procedure. Disadvantages of partial mastectomy include:  Slightly higher risk that your cancer will come back.  Needing more surgery at a later time.  Requiring radiation therapy after surgery, which has side effects and possible complications. This is done to reduce the  chances of breast cancer returning. Mastectomy Advantages of a mastectomy include:  Not needing to have radiation therapy or other treatments after surgery.  Lower chances of your cancer coming back. Disadvantages of a mastectomy include:  Longer recovery time compared to partial mastectomy.  Possibility of more complications.  Requiring additional surgeries to reconstruct your breast. Where to find more information  [place]: https://www.cancer.gov  American Cancer Society: http://www.cancer.org Questions to ask Here are some questions to ask about each surgery:  What will my recovery be like?  How will my breast look and feel?  What are the possible risks and complications of the surgery?  What additional treatment might I need after surgery?  What are the risks and complications of radiation therapy?  What are the risks and complications of chemotherapy?  Will I be able to have breast reconstruction? Summary  Surgery is  usually the first treatment for early-stage breast cancer. Most women have two surgery options.  One option is called partial mastectomy, or breast-sparing or breast-conserving surgery, and the other is called mastectomy. Both surgeries have good survival rates.  Each option has advantages and disadvantages to consider. The best treatment for one person might not be the best treatment for you.  Learn as much as you can about your cancer and work closely with your health care providers to make the choice that produces the best results for you. This information is not intended to replace advice given to you by your health care provider. Make sure you discuss any questions you have with your health care provider. Document Revised: 09/14/2017 Document Reviewed: 12/28/2016 Elsevier Patient Education  [place].

## 2020-02-19 NOTE — Progress Notes (Signed)
[facility] History and Physical  Reason for Referral: Left breast invasive carcinoma  Referring Physician:  Karma Ganja NP (Health Department)   Chief Complaint    Doris Newman (Initial Visit)      Doris Newman is a 49 y.o. female.  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 She underwent a mammogram after being [facility]. [name] did note a possible left breast mass and sent Doris Newman for imaging. Doris Newman had a prior mammogram 4 years ago, but not annually and this was reported to be normal at that time. Doris Newman has no prior history of any masses, lumps, bumps, nipple changes or discharge. She had menarche at age 19, and her first pregnancy at age 61. She is G2P1. She has a history of family breast cancer in her paternal grandmother and prostate cancer in her father.  She is currently undergoing menopause.  She has never had any previous biopsies or concerning areas on mammogram.  She has not had any chest radiation.  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 She does have a history of anxiety and gets a rapid heart rate but denies being anxious when these random events occur.  She has never had any know cardiac issues and has no EKG on file.  She says she has some SOB with walking and does not do much strenuous physical activity.    Past Medical History:  Diagnosis Date  . Anxiety   . GERD (gastroesophageal reflux disease)   . Hypertension     Past Surgical History:  Procedure Laterality Date  . LEEP      Family History  Problem Relation Age of Onset  . Prostate cancer  Father   . Breast cancer Paternal Grandmother     Social History   Tobacco Use  . Smoking status: Current Every Day Smoker  . Smokeless tobacco: Never Used  Substance Use Topics  . Alcohol use: Yes    Comment: occasional  . Drug use: No    Medications: I have reviewed Doris Newman's current medications. Allergies as of 02/19/2020   No Known Allergies     Medication List       Accurate as of Feb 19, 2020 11:59 PM. If you have any questions, ask your nurse or doctor.        STOP taking these medications   baclofen 10 MG tablet Commonly known as: LIORESAL Stopped by: Virl Cagey, MD   diclofenac 75 MG EC tablet Commonly known as: VOLTAREN Stopped by: Virl Cagey, MD   norethindrone 0.35 MG tablet Commonly known as: MICRONOR Stopped by: Virl Cagey, MD   predniSONE 10 MG (21) Tbpk tablet Commonly known as: STERAPRED UNI-PAK 21 TAB Stopped by: Virl Cagey, MD     TAKE these medications   ALPRAZolam 1 MG tablet Commonly known as: XANAX Take 1 mg by mouth 4 (four) times daily.   citalopram 20 MG tablet Commonly known as: CELEXA Take 20 mg by mouth daily.   enalapril 10 MG tablet Commonly known as: VASOTEC Take 1  tablet (10 mg total) by mouth daily.   GOODY HEADACHE PO Take 1 packet by mouth every 12 (twelve) hours as needed. headache   hydrochlorothiazide 25 MG tablet Commonly known as: HYDRODIURIL Take 1 tablet (25 mg total) by mouth daily.   ibuprofen 800 MG tablet Commonly known as: ADVIL Take 1 tablet (800 mg total) by mouth every 8 (eight) hours as needed.   mirtazapine 15 MG tablet Commonly known as: REMERON   omeprazole 20 MG capsule Commonly known as: PRILOSEC Take 1 capsule (20 mg total) by mouth daily.   propranolol 10 MG tablet Commonly known as: INDERAL Take 10 mg by mouth daily.        ROS:  A comprehensive review of systems was negative except for: Respiratory: positive for SOB Cardiovascular: positive  for chest pain and varicose veins  Blood pressure 102/67, pulse 82, temperature 97.8 F (36.6 C), temperature source Oral, resp. rate 12, height 5\' 6"  (1.676 m), weight 173 lb (78.5 kg), SpO2 97 %. Physical Exam Vitals reviewed. Exam conducted with a chaperone present.  HENT:     Head: Normocephalic and atraumatic.     Nose: Nose normal.     Mouth/Throat:     Mouth: Mucous membranes are moist.  Eyes:     Extraocular Movements: Extraocular movements intact.     Pupils: Pupils are equal, round, and reactive to light.  Cardiovascular:     Rate and Rhythm: Normal rate and regular rhythm.  Pulmonary:     Effort: Pulmonary effort is normal.     Breath sounds: Normal breath sounds.  Chest:     Breasts:        Right: No inverted nipple, mass, nipple discharge, skin change or tenderness.        Left: Mass present. No inverted nipple, nipple discharge, skin change or tenderness.     Comments: Left lateral breast with some slightly palpable area but difficult to discern, deep Abdominal:     General: There is no distension.     Palpations: Abdomen is soft.     Tenderness: There is no abdominal tenderness.  Musculoskeletal:        General: No swelling. Normal range of motion.     Cervical back: Normal range of motion. No rigidity.  Skin:    General: Skin is warm and dry.  Neurological:     General: No focal deficit present.     Mental Status: She is alert and oriented to person, place, and time.  Psychiatric:        Mood and Affect: Mood normal.        Behavior: Behavior normal.        Thought Content: Thought content normal.        Judgment: Judgment normal.     Results:           Assessment & Plan:  Doris Newman is a 49 y.o. female with a left invasive ductal carcinoma that is newly diagnosed. She says she has not felt this mass. Doris area is slightly palpable but moves and feels deep.  Given Doris location and difficulty in palpating she will need a needle guided  excision.   We have discussed Doris options for surgery including Doris option of mastectomy with sentinel node biopsy versus partial mastectomy (lumpectomy) with sentinel node biopsy. We have discussed that there is no difference in Doris prognosis or chance or recurrence or differences in survival between Doris two options. We have discussed Doris need for radiation  with Doris lumpectomy, and we have discussed that she will be referred to oncology after our procedure to further discuss her options for chemotherapy and hormonal therapy if she qualifies.   We have discussed that if she decides to have a lumpectomy that we will need to get a needle placed into Doris area where Doris biopsy was performed, since we cannot palpate a mass with great consistency. We have also discussed Doris need for injection of radiotracer and blue dye to perform Doris sentinel node biopsy.  We have discussed that Doris sentinel node biopsy tells [place] if Doris cancer has spread to Doris lymph nodes, and can help with plans for chemotherapy treatment and overall prognosis.    We have discussed that if Doris lumpectomy does not remove Doris entire cancer that she may have to have an additional procedure, and we have discussed that a positive sentinel node can require further removal of lymph nodes from Doris axilla but that recent research does not show any improvement in disease free survival and carries greater risk for lymphedema.    We have discussed that these are big discussions, and that Doris risk from Doris operations are similar including risk of bleeding, risk of infection, and risk of needing additional surgeries. We have discussed Doris likely need for an overnight stay with a mastectomy and a drain that will remain in place for about 1 week.    Given her chest pain and reported episodes that have come on without real inciting events. I thought about ordering a EKG but felt that this will likely need additional testing and will delay her surgery. We  have sent her for risk stratification with cardiology.   COVID testing preop discussed  Discussed FMLA and doing this at Doris time of surgery. Discussed that most people can return to work after surgery in 2 weeks, but that she may need longer due to Doris radiation and that Doris oncology team usually takes over Doris [facility]. Discussed that she should not worry about this and that if there are issues I will continue Doris FMLA if needed. Discussed that chemotherapy would be determined once we have Doris final pathology.   After she sees cardiology, we will get her scheduled ASAP. She is unable to do surgery on 5/19 or 5/20 due to a prior appointment that needs to be kept with her psychiatrist.    All questions were answered to Doris satisfaction of Doris Newman and family.   Virl Cagey 02/20/2020, 3:08 PM

## 2020-02-20 ENCOUNTER — Encounter: Payer: Self-pay | Admitting: General Surgery

## 2020-02-23 ENCOUNTER — Encounter: Payer: Self-pay | Admitting: Cardiology

## 2020-02-23 ENCOUNTER — Other Ambulatory Visit: Payer: Self-pay

## 2020-02-23 ENCOUNTER — Ambulatory Visit: Payer: BC Managed Care – PPO | Admitting: Cardiology

## 2020-02-23 VITALS — BP 100/70 | HR 82 | Ht 66.0 in | Wt 168.0 lb

## 2020-02-23 DIAGNOSIS — R0789 Other chest pain: Secondary | ICD-10-CM

## 2020-02-23 NOTE — Patient Instructions (Signed)
Your physician recommends that you schedule a follow-up appointment in: [place]   Your physician recommends that you continue on your current medications as directed. Please refer to the Current Medication list given to you today.  Thank you for choosing [facility]!!

## 2020-02-23 NOTE — Progress Notes (Signed)
Clinical Summary Ms. Watanabe is a 49 y.o.female seen as new consult, referred for chest pain by Dr Curlene Labrum    1. Chest pain - last episode 3-4 weeks - pressure midchest, 4/10 in severity. Could occur at rest or with activity. Not positional. Pain would last a few minutes. No other associated. 3 total episodes.  - no SOB or DOE - heavy labor at work, tolerating without troubles  CAD risk factors: HTN, borderline HL, +tobacco x 34 years, grandfather heart age 41 - walks up full flight of stairs at home regularly without exertioanl symptoms  2. Breast cancer - considering surgery   SH: works as Estate manager/land agent at [employer] Past Medical History:  Diagnosis Date  . Anxiety   . GERD (gastroesophageal reflux disease)   . Hypertension      No Known Allergies   Current Outpatient Medications  Medication Sig Dispense Refill  . ALPRAZolam (XANAX) 1 MG tablet Take 1 mg by mouth 4 (four) times daily.    . Aspirin-Acetaminophen-Caffeine (GOODY HEADACHE PO) Take 1 packet by mouth every 12 (twelve) hours as needed. headache    . citalopram (CELEXA) 20 MG tablet Take 20 mg by mouth daily.    . enalapril (VASOTEC) 10 MG tablet Take 1 tablet (10 mg total) by mouth daily. 90 tablet 1  . hydrochlorothiazide (HYDRODIURIL) 25 MG tablet Take 1 tablet (25 mg total) by mouth daily. 90 tablet 0  . ibuprofen (ADVIL,MOTRIN) 800 MG tablet Take 1 tablet (800 mg total) by mouth every 8 (eight) hours as needed. 30 tablet 0  . mirtazapine (REMERON) 15 MG tablet     . omeprazole (PRILOSEC) 20 MG capsule Take 1 capsule (20 mg total) by mouth daily. 90 capsule 1  . propranolol (INDERAL) 10 MG tablet Take 10 mg by mouth daily.     No current facility-administered medications for this visit.     Past Surgical History:  Procedure Laterality Date  . LEEP       No Known Allergies    Family History  Problem Relation Age of Onset  . Prostate cancer Father   . Breast cancer Paternal  Grandmother      Social History Ms. Dunshee reports that she has been smoking. She has never used smokeless tobacco. Ms. Dickerman reports current alcohol use.   Review of Systems CONSTITUTIONAL: No weight loss, fever, chills, weakness or fatigue.  HEENT: Eyes: No visual loss, blurred vision, double vision or yellow sclerae.No hearing loss, sneezing, congestion, runny nose or sore throat.  SKIN: No rash or itching.  CARDIOVASCULAR: per hpi RESPIRATORY: No shortness of breath, cough or sputum.  GASTROINTESTINAL: No anorexia, nausea, vomiting or diarrhea. No abdominal pain or blood.  GENITOURINARY: No burning on urination, no polyuria NEUROLOGICAL: No headache, dizziness, syncope, paralysis, ataxia, numbness or tingling in the extremities. No change in bowel or bladder control.  MUSCULOSKELETAL: No muscle, back pain, joint pain or stiffness.  LYMPHATICS: No enlarged nodes. No history of splenectomy.  PSYCHIATRIC: No history of depression or anxiety.  ENDOCRINOLOGIC: No reports of sweating, cold or heat intolerance. No polyuria or polydipsia.  Marland Kitchen   Physical Examination Today's Vitals   02/23/20 1454  BP: 100/70  Pulse: 82  SpO2: 98%  Weight: 168 lb (76.2 kg)  Height: 5\' 6"  (1.676 m)   Body mass index is 27.12 kg/m.  Gen: resting comfortably, no acute distress HEENT: no scleral icterus, pupils equal round and reactive, no palptable cervical adenopathy,  CV: RRR, no  m/r/g, no jvd Resp: Clear to auscultation bilaterally GI: abdomen is soft, non-tender, non-distended, normal bowel sounds, no hepatosplenomegaly MSK: extremities are warm, no edema.  Skin: warm, no rash Neuro:  no focal deficits Psych: appropriate affect     Assessment and Plan  1. Chest pain - 3 episodes over the last several months, last episode 3-4 months ago. Tolerates high levels of exertion regularly at work and at home without any exertional symptoms - EKG today SR, no ischemic symptoms -  essentially nonspecific nonexertional chest pains, overall infrequent - would not plan for ischemci testing at this time - tolerates well over 4METs without limitations, would recommend proceeding with planned breast surgery   F/u with [place] as needed       Arnoldo Lenis, M.D.

## 2020-02-24 ENCOUNTER — Telehealth: Payer: Self-pay

## 2020-02-24 ENCOUNTER — Other Ambulatory Visit (HOSPITAL_COMMUNITY): Payer: Self-pay | Admitting: General Surgery

## 2020-02-24 DIAGNOSIS — Z853 Personal history of malignant neoplasm of breast: Secondary | ICD-10-CM

## 2020-02-24 NOTE — Telephone Encounter (Signed)
FMLA completed start date 03/05/20 covid and pre-op surgery scheduled 03/08/20. Faxed to [facility] [phone].

## 2020-02-27 DIAGNOSIS — S61214A Laceration without foreign body of right ring finger without damage to nail, initial encounter: Secondary | ICD-10-CM | POA: Diagnosis not present

## 2020-02-27 DIAGNOSIS — F17210 Nicotine dependence, cigarettes, uncomplicated: Secondary | ICD-10-CM | POA: Diagnosis not present

## 2020-02-27 DIAGNOSIS — R202 Paresthesia of skin: Secondary | ICD-10-CM | POA: Diagnosis not present

## 2020-02-27 DIAGNOSIS — W293XXA Contact with powered garden and outdoor hand tools and machinery, initial encounter: Secondary | ICD-10-CM | POA: Diagnosis not present

## 2020-02-27 DIAGNOSIS — Y998 Other external cause status: Secondary | ICD-10-CM | POA: Diagnosis not present

## 2020-02-27 DIAGNOSIS — J45909 Unspecified asthma, uncomplicated: Secondary | ICD-10-CM | POA: Diagnosis not present

## 2020-02-27 DIAGNOSIS — S6991XA Unspecified injury of right wrist, hand and finger(s), initial encounter: Secondary | ICD-10-CM | POA: Diagnosis not present

## 2020-02-27 DIAGNOSIS — Y92017 Garden or yard in single-family (private) house as the place of occurrence of the external cause: Secondary | ICD-10-CM | POA: Diagnosis not present

## 2020-03-03 NOTE — Patient Instructions (Signed)
Doris Newman  03/03/2020     @PREFPERIOPPHARMACY @   Your procedure is scheduled on  03/08/2020   Report to [facility] at  [email or web address].  Call this number if you have problems the morning of surgery:  [phone]   Remember:  Do not eat or drink after midnight.                       Take these medicines the morning of surgery with A SIP OF WATER  Xanax(if needed), celexa, claritin, prilosec, propranolol.    Do not wear jewelry, make-up or nail polish.  Do not wear lotions, powders, or perfumes, or deodorant. Please brush your teeth.  Do not shave 48 hours prior to surgery.  Men may shave face and neck.  Do not bring valuables to the hospital.  [facility] is not responsible for any belongings or valuables.  Contacts, dentures or bridgework may not be worn into surgery.  Leave your suitcase in the car.  After surgery it may be brought to your room.  For patients admitted to the hospital, discharge time will be determined by your treatment team.  Patients discharged the day of surgery will not be allowed to drive home.   Name and phone number of your driver:   family Special instructions:  DO NOT smoke the day of your procedure.  Please read over the following fact sheets that you were given. Anesthesia Post-op Instructions and Care and Recovery After Surgery       Sentinel Lymph Node Biopsy, Care After This sheet gives you information about how to care for yourself after your procedure. Your health care provider may also give you more specific instructions. If you have problems or questions, contact your health care provider. What can I expect after the procedure? After the procedure, it is common to have:  Blue urine or stool for the next 24-48 hours. This is normal. It is caused by the dye used during the procedure.  Blue skin at the injection site. This may last for up to 8 weeks.  Numbness, tingling, or pain near your incision site.  Swelling or  bruising near your incision. Follow these instructions at home: Incision care      Follow instructions from your health care provider about how to take care of your incision. Make sure you: ? Wash your hands with soap and water before you change your bandage (dressing). If soap and water are not available, use hand sanitizer. ? Change your dressing as told by your health care provider. ? Leave stitches (sutures), skin glue, or adhesive strips in place. These skin closures may need to stay in place for 2 weeks or longer. If adhesive strip edges start to loosen and curl up, you may trim the loose edges. Do not remove adhesive strips completely unless your health care provider tells you to do that.  Check your incision area every day for signs of infection. Check for: ? Redness, swelling, or pain. ? Fluid or blood. ? Warmth. ? Pus or a bad smell.  Do not take baths, swim, or use a hot tub until your health care provider approves. Ask your health care provider if you can take showers. You may be able to shower 24 hours after your procedure. After a shower, pat the incision area dry with a clean towel. Do not rub the incision. That could cause bleeding. Activity  Avoid activities that take  a lot of effort.  Return to your normal activities as told by your health care provider. Ask your health care provider what activities are safe for you. General instructions  Take over-the-counter and prescription medicines only as told by your health care provider.  You may resume your regular diet.  If the procedure was done on or near the lymph nodes under your arm (axillary lymph nodes), do not have your blood pressure taken or have blood drawn from the arm on the side of the biopsy until your health care provider says it is okay.  You may need to be screened for extra fluid around the lymph nodes (lymphedema). Follow instructions from your health care provider about how often you should be  checked.  Keep all follow-up visits as told by your health care provider. This is important. Contact a health care provider if:  Your pain medicine is not helping.  You have more redness, swelling, or pain around your biopsy site.  You have more fluid or blood coming from your incision.  Your incision feels warm to the touch.  You have pus or a bad smell coming from your incision.  You have nausea and vomiting.  You have any new bruising.  You have chills or a fever. Get help right away if:  You have pain that is getting worse, and your medicine is not helping.  You have vomiting that will not stop.  You have chest pain or trouble breathing. Summary  After the procedure, it is common to have blue urine or stool for the next 24-48 hours.  Follow instructions from your health care provider about how to take care of your incision. Check your incision area every day for signs of infection.  Take over-the-counter and prescription medicines only as told by your health care provider.  Ask your health care provider when you can return to your normal activities. This information is not intended to replace advice given to you by your health care provider. Make sure you discuss any questions you have with your health care provider. Document Revised: 10/19/2017 Document Reviewed: 10/15/2017 Elsevier Patient Education  2020 [place] Anesthesia, Adult, Care After This sheet gives you information about how to care for yourself after your procedure. Your health care provider may also give you more specific instructions. If you have problems or questions, contact your health care provider. What can I expect after the procedure? After the procedure, the following side effects are common:  Pain or discomfort at the IV site.  Nausea.  Vomiting.  Sore throat.  Trouble concentrating.  Feeling cold or chills.  Weak or tired.  Sleepiness and fatigue.  Soreness and  body aches. These side effects can affect parts of the body that were not involved in surgery. Follow these instructions at home:  For at least 24 hours after the procedure:  Have a responsible adult stay with you. It is important to have someone help care for you until you are awake and alert.  Rest as needed.  Do not: ? Participate in activities in which you could fall or become injured. ? Drive. ? Use heavy machinery. ? Drink alcohol. ? Take sleeping pills or medicines that cause drowsiness. ? Make important decisions or sign legal documents. ? Take care of children on your own. Eating and drinking  Follow any instructions from your health care provider about eating or drinking restrictions.  When you feel hungry, start by eating small amounts of foods that are  soft and easy to digest (bland), such as toast. Gradually return to your regular diet.  Drink enough fluid to keep your urine pale yellow.  If you vomit, rehydrate by drinking water, juice, or clear broth. General instructions  If you have sleep apnea, surgery and certain medicines can increase your risk for breathing problems. Follow instructions from your health care provider about wearing your sleep device: ? Anytime you are sleeping, including during daytime naps. ? While taking prescription pain medicines, sleeping medicines, or medicines that make you drowsy.  Return to your normal activities as told by your health care provider. Ask your health care provider what activities are safe for you.  Take over-the-counter and prescription medicines only as told by your health care provider.  If you smoke, do not smoke without supervision.  Keep all follow-up visits as told by your health care provider. This is important. Contact a health care provider if:  You have nausea or vomiting that does not get better with medicine.  You cannot eat or drink without vomiting.  You have pain that does not get better with  medicine.  You are unable to pass urine.  You develop a skin rash.  You have a fever.  You have redness around your IV site that gets worse. Get help right away if:  You have difficulty breathing.  You have chest pain.  You have blood in your urine or stool, or you vomit blood. Summary  After the procedure, it is common to have a sore throat or nausea. It is also common to feel tired.  Have a responsible adult stay with you for the first 24 hours after general anesthesia. It is important to have someone help care for you until you are awake and alert.  When you feel hungry, start by eating small amounts of foods that are soft and easy to digest (bland), such as toast. Gradually return to your regular diet.  Drink enough fluid to keep your urine pale yellow.  Return to your normal activities as told by your health care provider. Ask your health care provider what activities are safe for you. This information is not intended to replace advice given to you by your health care provider. Make sure you discuss any questions you have with your health care provider. Document Revised: 10/05/2017 Document Reviewed: 05/18/2017 Elsevier Patient Education  [place]. How to Use Chlorhexidine for Bathing Chlorhexidine gluconate (CHG) is a germ-killing (antiseptic) solution that is used to clean the skin. It can get rid of the bacteria that normally live on the skin and can keep them away for about 24 hours. To clean your skin with CHG, you may be given:  A CHG solution to use in the shower or as part of a sponge bath.  A prepackaged cloth that contains CHG. Cleaning your skin with CHG may help lower the risk for infection:  While you are staying in the intensive care unit of the hospital.  If you have a vascular access, such as a central line, to provide short-term or long-term access to your veins.  If you have a catheter to drain urine from your bladder.  If you are on a  ventilator. A ventilator is a machine that helps you breathe by moving air in and out of your lungs.  After surgery. What are the risks? Risks of using CHG include:  A skin reaction.  Hearing loss, if CHG gets in your ears.  Eye injury, if CHG gets in  your eyes and is not rinsed out.  The CHG product catching fire. Make sure that you avoid smoking and flames after applying CHG to your skin. Do not use CHG:  If you have a chlorhexidine allergy or have previously reacted to chlorhexidine.  On babies younger than 47 months of age. How to use CHG solution  Use CHG only as told by your health care provider, and follow the instructions on the label.  Use the full amount of CHG as directed. Usually, this is one bottle. During a shower Follow these steps when using CHG solution during a shower (unless your health care provider gives you different instructions): 1. Start the shower. 2. Use your normal soap and shampoo to wash your face and hair. 3. Turn off the shower or move out of the shower stream. 4. Pour the CHG onto a clean washcloth. Do not use any type of brush or rough-edged sponge. 5. Starting at your neck, lather your body down to your toes. Make sure you follow these instructions: ? If you will be having surgery, pay special attention to the part of your body where you will be having surgery. Scrub this area for at least 1 minute. ? Do not use CHG on your head or face. If the solution gets into your ears or eyes, rinse them well with water. ? Avoid your genital area. ? Avoid any areas of skin that have broken skin, cuts, or scrapes. ? Scrub your back and under your arms. Make sure to wash skin folds. 6. Let the lather sit on your skin for 1-2 minutes or as long as told by your health care provider. 7. Thoroughly rinse your entire body in the shower. Make sure that all body creases and crevices are rinsed well. 8. Dry off with a clean towel. Do not put any substances on your  body afterward--such as powder, lotion, or perfume--unless you are told to do so by your health care provider. Only use lotions that are recommended by the manufacturer. 9. Put on clean clothes or pajamas. 10. If it is the night before your surgery, sleep in clean sheets.  During a sponge bath Follow these steps when using CHG solution during a sponge bath (unless your health care provider gives you different instructions): 1. Use your normal soap and shampoo to wash your face and hair. 2. Pour the CHG onto a clean washcloth. 3. Starting at your neck, lather your body down to your toes. Make sure you follow these instructions: ? If you will be having surgery, pay special attention to the part of your body where you will be having surgery. Scrub this area for at least 1 minute. ? Do not use CHG on your head or face. If the solution gets into your ears or eyes, rinse them well with water. ? Avoid your genital area. ? Avoid any areas of skin that have broken skin, cuts, or scrapes. ? Scrub your back and under your arms. Make sure to wash skin folds. 4. Let the lather sit on your skin for 1-2 minutes or as long as told by your health care provider. 5. Using a different clean, wet washcloth, thoroughly rinse your entire body. Make sure that all body creases and crevices are rinsed well. 6. Dry off with a clean towel. Do not put any substances on your body afterward--such as powder, lotion, or perfume--unless you are told to do so by your health care provider. Only use lotions that are recommended by the  manufacturer. 7. Put on clean clothes or pajamas. 8. If it is the night before your surgery, sleep in clean sheets. How to use CHG prepackaged cloths  Only use CHG cloths as told by your health care provider, and follow the instructions on the label.  Use the CHG cloth on clean, dry skin.  Do not use the CHG cloth on your head or face unless your health care provider tells you to.  When washing  with the CHG cloth: ? Avoid your genital area. ? Avoid any areas of skin that have broken skin, cuts, or scrapes. Before surgery Follow these steps when using a CHG cloth to clean before surgery (unless your health care provider gives you different instructions): 1. Using the CHG cloth, vigorously scrub the part of your body where you will be having surgery. Scrub using a back-and-forth motion for 3 minutes. The area on your body should be completely wet with CHG when you are done scrubbing. 2. Do not rinse. Discard the cloth and let the area air-dry. Do not put any substances on the area afterward, such as powder, lotion, or perfume. 3. Put on clean clothes or pajamas. 4. If it is the night before your surgery, sleep in clean sheets.  For general bathing Follow these steps when using CHG cloths for general bathing (unless your health care provider gives you different instructions). 1. Use a separate CHG cloth for each area of your body. Make sure you wash between any folds of skin and between your fingers and toes. Wash your body in the following order, switching to a new cloth after each step: ? The front of your neck, shoulders, and chest. ? Both of your arms, under your arms, and your hands. ? Your stomach and groin area, avoiding the genitals. ? Your right leg and foot. ? Your left leg and foot. ? The back of your neck, your back, and your buttocks. 2. Do not rinse. Discard the cloth and let the area air-dry. Do not put any substances on your body afterward--such as powder, lotion, or perfume--unless you are told to do so by your health care provider. Only use lotions that are recommended by the manufacturer. 3. Put on clean clothes or pajamas. Contact a health care provider if:  Your skin gets irritated after scrubbing.  You have questions about using your solution or cloth. Get help right away if:  Your eyes become very red or swollen.  Your eyes itch badly.  Your skin itches  badly and is red or swollen.  Your hearing changes.  You have trouble seeing.  You have swelling or tingling in your mouth or throat.  You have trouble breathing.  You swallow any chlorhexidine. Summary  Chlorhexidine gluconate (CHG) is a germ-killing (antiseptic) solution that is used to clean the skin. Cleaning your skin with CHG may help to lower your risk for infection.  You may be given CHG to use for bathing. It may be in a bottle or in a prepackaged cloth to use on your skin. Carefully follow your health care provider's instructions and the instructions on the product label.  Do not use CHG if you have a chlorhexidine allergy.  Contact your health care provider if your skin gets irritated after scrubbing. This information is not intended to replace advice given to you by your health care provider. Make sure you discuss any questions you have with your health care provider. Document Revised: 12/19/2018 Document Reviewed: 08/30/2017 Elsevier Patient Education  2020 Elsevier  Inc.  

## 2020-03-04 DIAGNOSIS — F4321 Adjustment disorder with depressed mood: Secondary | ICD-10-CM | POA: Diagnosis not present

## 2020-03-05 ENCOUNTER — Other Ambulatory Visit: Payer: Self-pay

## 2020-03-05 ENCOUNTER — Other Ambulatory Visit (HOSPITAL_COMMUNITY)
Admission: RE | Admit: 2020-03-05 | Discharge: 2020-03-05 | Disposition: A | Discharge status: Home / Self Care | Payer: BC Managed Care – PPO | Source: Ambulatory Visit | Attending: General Surgery | Admitting: General Surgery

## 2020-03-05 ENCOUNTER — Encounter (HOSPITAL_COMMUNITY)
Admission: RE | Admit: 2020-03-05 | Discharge: 2020-03-05 | Disposition: A | Discharge status: Home / Self Care | Payer: BC Managed Care – PPO | Source: Ambulatory Visit | Attending: General Surgery | Admitting: General Surgery

## 2020-03-05 ENCOUNTER — Encounter (HOSPITAL_COMMUNITY): Payer: Self-pay

## 2020-03-05 DIAGNOSIS — Z01812 Encounter for preprocedural laboratory examination: Secondary | ICD-10-CM | POA: Insufficient documentation

## 2020-03-05 DIAGNOSIS — I10 Essential (primary) hypertension: Secondary | ICD-10-CM | POA: Diagnosis not present

## 2020-03-05 DIAGNOSIS — F419 Anxiety disorder, unspecified: Secondary | ICD-10-CM | POA: Diagnosis not present

## 2020-03-05 DIAGNOSIS — K219 Gastro-esophageal reflux disease without esophagitis: Secondary | ICD-10-CM | POA: Diagnosis not present

## 2020-03-05 DIAGNOSIS — Z803 Family history of malignant neoplasm of breast: Secondary | ICD-10-CM | POA: Diagnosis not present

## 2020-03-05 DIAGNOSIS — Z8042 Family history of malignant neoplasm of prostate: Secondary | ICD-10-CM | POA: Diagnosis not present

## 2020-03-05 DIAGNOSIS — Z20822 Contact with and (suspected) exposure to covid-19: Secondary | ICD-10-CM | POA: Diagnosis not present

## 2020-03-05 DIAGNOSIS — C50912 Malignant neoplasm of unspecified site of left female breast: Secondary | ICD-10-CM | POA: Diagnosis not present

## 2020-03-05 DIAGNOSIS — Z79899 Other long term (current) drug therapy: Secondary | ICD-10-CM | POA: Diagnosis not present

## 2020-03-05 DIAGNOSIS — F17200 Nicotine dependence, unspecified, uncomplicated: Secondary | ICD-10-CM | POA: Diagnosis not present

## 2020-03-05 HISTORY — DX: Family history of other specified conditions: Z84.89

## 2020-03-05 LAB — HCG, SERUM, QUALITATIVE: Preg, Serum: NEGATIVE

## 2020-03-05 LAB — BASIC METABOLIC PANEL
Anion gap: 14 (ref 5–15)
BUN: 8 mg/dL (ref 6–20)
CO2: 25 mmol/L (ref 22–32)
Calcium: 8.8 mg/dL — ABNORMAL LOW (ref 8.9–10.3)
Chloride: 89 mmol/L — ABNORMAL LOW (ref 98–111)
Creatinine, Ser: 0.78 mg/dL (ref 0.44–1.00)
GFR calc Af Amer: >60 mL/min (ref >60)
GFR calc non Af Amer: >60 mL/min (ref >60)
Glucose, Bld: 89 mg/dL (ref 70–99)
Potassium: 3.4 mmol/L — ABNORMAL LOW (ref 3.5–5.1)
Sodium: 128 mmol/L — ABNORMAL LOW (ref 135–145)

## 2020-03-05 LAB — SARS CORONAVIRUS 2 (TAT 6-24 HRS): SARS Coronavirus 2: NEGATIVE

## 2020-03-08 ENCOUNTER — Ambulatory Visit (HOSPITAL_COMMUNITY)
Admission: RE | Admit: 2020-03-08 | Discharge: 2020-03-08 | Disposition: A | Discharge status: Home / Self Care | Payer: BC Managed Care – PPO | Source: Ambulatory Visit | Attending: General Surgery | Admitting: General Surgery

## 2020-03-08 ENCOUNTER — Ambulatory Visit (HOSPITAL_COMMUNITY): Payer: BC Managed Care – PPO | Admitting: Anesthesiology

## 2020-03-08 ENCOUNTER — Encounter (HOSPITAL_COMMUNITY): Payer: Self-pay | Admitting: General Surgery

## 2020-03-08 ENCOUNTER — Ambulatory Visit (HOSPITAL_COMMUNITY): Payer: BC Managed Care – PPO

## 2020-03-08 ENCOUNTER — Encounter (HOSPITAL_COMMUNITY)
Admission: RE | Disposition: A | Discharge status: Home / Self Care | Payer: Self-pay | Source: Home / Self Care | Attending: General Surgery

## 2020-03-08 ENCOUNTER — Ambulatory Visit (HOSPITAL_COMMUNITY)
Admission: RE | Admit: 2020-03-08 | Discharge: 2020-03-08 | Disposition: A | Discharge status: Home / Self Care | Payer: BC Managed Care – PPO | Attending: General Surgery | Admitting: General Surgery

## 2020-03-08 ENCOUNTER — Other Ambulatory Visit: Payer: Self-pay

## 2020-03-08 DIAGNOSIS — Z8042 Family history of malignant neoplasm of prostate: Secondary | ICD-10-CM | POA: Insufficient documentation

## 2020-03-08 DIAGNOSIS — F17200 Nicotine dependence, unspecified, uncomplicated: Secondary | ICD-10-CM | POA: Diagnosis not present

## 2020-03-08 DIAGNOSIS — I10 Essential (primary) hypertension: Secondary | ICD-10-CM | POA: Insufficient documentation

## 2020-03-08 DIAGNOSIS — Z853 Personal history of malignant neoplasm of breast: Secondary | ICD-10-CM

## 2020-03-08 DIAGNOSIS — Z803 Family history of malignant neoplasm of breast: Secondary | ICD-10-CM | POA: Insufficient documentation

## 2020-03-08 DIAGNOSIS — Z1231 Encounter for screening mammogram for malignant neoplasm of breast: Secondary | ICD-10-CM

## 2020-03-08 DIAGNOSIS — K219 Gastro-esophageal reflux disease without esophagitis: Secondary | ICD-10-CM | POA: Insufficient documentation

## 2020-03-08 DIAGNOSIS — F419 Anxiety disorder, unspecified: Secondary | ICD-10-CM | POA: Diagnosis not present

## 2020-03-08 DIAGNOSIS — C50912 Malignant neoplasm of unspecified site of left female breast: Secondary | ICD-10-CM | POA: Diagnosis not present

## 2020-03-08 DIAGNOSIS — Z20822 Contact with and (suspected) exposure to covid-19: Secondary | ICD-10-CM | POA: Insufficient documentation

## 2020-03-08 DIAGNOSIS — Z79899 Other long term (current) drug therapy: Secondary | ICD-10-CM | POA: Insufficient documentation

## 2020-03-08 HISTORY — PX: BREAST LUMPECTOMY: SHX2

## 2020-03-08 HISTORY — PX: PARTIAL MASTECTOMY WITH NEEDLE LOCALIZATION AND AXILLARY SENTINEL LYMPH NODE BX: SHX6009

## 2020-03-08 SURGERY — PARTIAL MASTECTOMY WITH NEEDLE LOCALIZATION AND AXILLARY SENTINEL LYMPH NODE BX
Anesthesia: General | Site: Breast | Laterality: Left

## 2020-03-08 MED ORDER — MIDAZOLAM HCL 2 MG/2ML IJ SOLN
2.0000 mg | Freq: Once | INTRAMUSCULAR | Status: AC
  Administered 2020-03-08: 2 mg via INTRAVENOUS

## 2020-03-08 MED ORDER — ONDANSETRON HCL 4 MG PO TABS: 4.0000 mg | ORAL_TABLET | Freq: Three times a day (TID) | ORAL | 1 refills | Status: AC | PRN

## 2020-03-08 MED ORDER — MEPERIDINE HCL 50 MG/ML IJ SOLN: 6.2500 mg | INTRAMUSCULAR | Status: DC | PRN

## 2020-03-08 MED ORDER — CHLORHEXIDINE GLUCONATE CLOTH 2 % EX PADS: 6.0000 | MEDICATED_PAD | Freq: Once | CUTANEOUS | Status: DC

## 2020-03-08 MED ORDER — FENTANYL CITRATE (PF) 100 MCG/2ML IJ SOLN
INTRAMUSCULAR | Status: AC
  Filled 2020-03-08: qty 2

## 2020-03-08 MED ORDER — PROPOFOL 10 MG/ML IV BOLUS
INTRAVENOUS | Status: DC | PRN
  Administered 2020-03-08: 200 mg via INTRAVENOUS
  Administered 2020-03-08: 25 ug/kg/min via INTRAVENOUS

## 2020-03-08 MED ORDER — DEXAMETHASONE SODIUM PHOSPHATE 10 MG/ML IJ SOLN
INTRAMUSCULAR | Status: DC | PRN
Start: 2020-03-08 — End: 2020-03-08
  Administered 2020-03-08: 8 mg via INTRAVENOUS

## 2020-03-08 MED ORDER — GLYCOPYRROLATE PF 0.2 MG/ML IJ SOSY
PREFILLED_SYRINGE | INTRAMUSCULAR | Status: AC
  Filled 2020-03-08: qty 1

## 2020-03-08 MED ORDER — TECHNETIUM TC 99M SULFUR COLLOID FILTERED
0.5000 | Freq: Once | INTRAVENOUS | Status: AC | PRN
  Administered 2020-03-08: 0.5 via INTRADERMAL

## 2020-03-08 MED ORDER — ORAL CARE MOUTH RINSE: 15.0000 mL | Freq: Once | OROMUCOSAL | Status: AC

## 2020-03-08 MED ORDER — ONDANSETRON HCL 4 MG/2ML IJ SOLN
INTRAMUSCULAR | Status: AC
  Filled 2020-03-08: qty 2

## 2020-03-08 MED ORDER — MIDAZOLAM HCL 5 MG/5ML IJ SOLN
INTRAMUSCULAR | Status: DC | PRN
  Administered 2020-03-08: 2 mg via INTRAVENOUS

## 2020-03-08 MED ORDER — LACTATED RINGERS IV SOLN: INTRAVENOUS | Status: DC | PRN

## 2020-03-08 MED ORDER — METHYLENE BLUE 0.5 % INJ SOLN
INTRAVENOUS | Status: AC
  Filled 2020-03-08: qty 10

## 2020-03-08 MED ORDER — OXYCODONE HCL 5 MG PO TABS: 5.0000 mg | ORAL_TABLET | ORAL | 0 refills | Status: DC | PRN

## 2020-03-08 MED ORDER — LIDOCAINE 2% (20 MG/ML) 5 ML SYRINGE
INTRAMUSCULAR | Status: AC
  Filled 2020-03-08: qty 5

## 2020-03-08 MED ORDER — METHYLENE BLUE 0.5 % INJ SOLN
INTRAVENOUS | Status: DC | PRN
  Administered 2020-03-08: 2 mL via SUBMUCOSAL

## 2020-03-08 MED ORDER — DOCUSATE SODIUM 100 MG PO CAPS: 100.0000 mg | ORAL_CAPSULE | Freq: Two times a day (BID) | ORAL | 2 refills | Status: AC

## 2020-03-08 MED ORDER — ONDANSETRON HCL 4 MG/2ML IJ SOLN
INTRAMUSCULAR | Status: DC | PRN
  Administered 2020-03-08: 4 mg via INTRAVENOUS

## 2020-03-08 MED ORDER — MIDAZOLAM HCL 2 MG/2ML IJ SOLN
INTRAMUSCULAR | Status: AC
  Filled 2020-03-08: qty 2

## 2020-03-08 MED ORDER — BUPIVACAINE HCL (PF) 0.5 % IJ SOLN
INTRAMUSCULAR | Status: DC | PRN
  Administered 2020-03-08: 16 mL

## 2020-03-08 MED ORDER — CHLORHEXIDINE GLUCONATE 0.12 % MT SOLN
15.0000 mL | Freq: Once | OROMUCOSAL | Status: AC
  Administered 2020-03-08: 15 mL via OROMUCOSAL

## 2020-03-08 MED ORDER — BUPIVACAINE HCL (PF) 0.5 % IJ SOLN
INTRAMUSCULAR | Status: AC
  Filled 2020-03-08: qty 30

## 2020-03-08 MED ORDER — SODIUM CHLORIDE (PF) 0.9 % IJ SOLN
INTRAMUSCULAR | Status: AC
  Filled 2020-03-08: qty 10

## 2020-03-08 MED ORDER — ONDANSETRON HCL 4 MG/2ML IJ SOLN: 4.0000 mg | Freq: Once | INTRAMUSCULAR | Status: DC | PRN

## 2020-03-08 MED ORDER — SODIUM CHLORIDE (PF) 0.9 % IJ SOLN
INTRAMUSCULAR | Status: DC | PRN
  Administered 2020-03-08: 3 mL via INTRAVENOUS

## 2020-03-08 MED ORDER — 0.9 % SODIUM CHLORIDE (POUR BTL) OPTIME
TOPICAL | Status: DC | PRN
  Administered 2020-03-08: 1000 mL

## 2020-03-08 MED ORDER — EPHEDRINE SULFATE 50 MG/ML IJ SOLN
INTRAMUSCULAR | Status: DC | PRN
  Administered 2020-03-08 (×2): 5 mg via INTRAVENOUS

## 2020-03-08 MED ORDER — DEXAMETHASONE SODIUM PHOSPHATE 10 MG/ML IJ SOLN
INTRAMUSCULAR | Status: AC
  Filled 2020-03-08: qty 1

## 2020-03-08 MED ORDER — CEFAZOLIN SODIUM-DEXTROSE 2-4 GM/100ML-% IV SOLN
INTRAVENOUS | Status: AC
  Filled 2020-03-08: qty 100

## 2020-03-08 MED ORDER — CEFAZOLIN SODIUM-DEXTROSE 2-4 GM/100ML-% IV SOLN
2.0000 g | INTRAVENOUS | Status: AC
  Administered 2020-03-08: 2 g via INTRAVENOUS

## 2020-03-08 MED ORDER — LACTATED RINGERS IV SOLN: Freq: Once | INTRAVENOUS | Status: AC

## 2020-03-08 MED ORDER — FENTANYL CITRATE (PF) 100 MCG/2ML IJ SOLN
INTRAMUSCULAR | Status: DC | PRN
  Administered 2020-03-08 (×2): 25 ug via INTRAVENOUS
  Administered 2020-03-08: 50 ug via INTRAVENOUS

## 2020-03-08 MED ORDER — PROPOFOL 10 MG/ML IV BOLUS
INTRAVENOUS | Status: AC
  Filled 2020-03-08: qty 60

## 2020-03-08 MED ORDER — GLYCOPYRROLATE 0.2 MG/ML IJ SOLN
INTRAMUSCULAR | Status: DC | PRN
Start: 2020-03-08 — End: 2020-03-08
  Administered 2020-03-08: .2 mg via INTRAVENOUS

## 2020-03-08 MED ORDER — LIDOCAINE HCL (PF) 2 % IJ SOLN
INTRAMUSCULAR | Status: AC
  Filled 2020-03-08: qty 10

## 2020-03-08 MED ORDER — TECHNETIUM TC 99M SULFUR COLLOID FILTERED
0.5000 | Freq: Once | INTRAVENOUS | Status: AC | PRN
  Administered 2020-03-08: 0.6 via INTRADERMAL

## 2020-03-08 MED ORDER — HYDROMORPHONE HCL 1 MG/ML IJ SOLN
0.2500 mg | INTRAMUSCULAR | Status: DC | PRN
  Administered 2020-03-08 (×2): 0.5 mg via INTRAVENOUS
  Filled 2020-03-08 (×2): qty 0.5

## 2020-03-08 MED ORDER — LIDOCAINE HCL (CARDIAC) PF 100 MG/5ML IV SOSY
PREFILLED_SYRINGE | INTRAVENOUS | Status: DC | PRN
  Administered 2020-03-08: 100 mg via INTRAVENOUS

## 2020-03-08 MED ORDER — PENTAFLUOROPROP-TETRAFLUOROETH EX AERO
INHALATION_SPRAY | CUTANEOUS | Status: AC
  Filled 2020-03-08: qty 116

## 2020-03-08 SURGICAL SUPPLY — 41 items
ADH SKN CLS APL DERMABOND .7 (GAUZE/BANDAGES/DRESSINGS) ×1
APL PRP STRL LF DISP 70% ISPRP (MISCELLANEOUS) ×1
APPLIER CLIP 9.375 SM OPEN (CLIP) ×4
APR CLP SM 9.3 20 MLT OPN (CLIP) ×2
CHLORAPREP W/TINT 26 (MISCELLANEOUS) ×2 IMPLANT
CLIP APPLIE 9.375 SM OPEN (CLIP) ×1 IMPLANT
CLOTH BEACON ORANGE TIMEOUT ST (SAFETY) ×2 IMPLANT
CONT SPEC 4OZ CLIKSEAL STRL BL (MISCELLANEOUS) ×2 IMPLANT
COVER LIGHT HANDLE STERIS (MISCELLANEOUS) ×4 IMPLANT
COVER PROBE W GEL 5X96 (DRAPES) ×2 IMPLANT
COVER WAND RF STERILE (DRAPES) ×2 IMPLANT
DECANTER SPIKE VIAL GLASS SM (MISCELLANEOUS) ×2 IMPLANT
DERMABOND ADVANCED (GAUZE/BANDAGES/DRESSINGS) ×1
DERMABOND ADVANCED .7 DNX12 (GAUZE/BANDAGES/DRESSINGS) ×1 IMPLANT
ELECT REM PT RETURN 9FT ADLT (ELECTROSURGICAL) ×2
ELECTRODE REM PT RTRN 9FT ADLT (ELECTROSURGICAL) ×1 IMPLANT
GLOVE BIO SURGEON STRL SZ 6.5 (GLOVE) ×2 IMPLANT
GLOVE BIO SURGEON STRL SZ7 (GLOVE) ×1 IMPLANT
GLOVE BIOGEL PI IND STRL 6.5 (GLOVE) ×1 IMPLANT
GLOVE BIOGEL PI IND STRL 7.0 (GLOVE) ×3 IMPLANT
GLOVE BIOGEL PI INDICATOR 6.5 (GLOVE) ×1
GLOVE BIOGEL PI INDICATOR 7.0 (GLOVE) ×3
GOWN STRL REUS W/TWL LRG LVL3 (GOWN DISPOSABLE) ×6 IMPLANT
INST SET MINOR GENERAL (KITS) ×2 IMPLANT
KIT TURNOVER KIT A (KITS) ×2 IMPLANT
MANIFOLD NEPTUNE II (INSTRUMENTS) ×2 IMPLANT
NDL HYPO 18GX1.5 BLUNT FILL (NEEDLE) ×1 IMPLANT
NDL HYPO 25X1 1.5 SAFETY (NEEDLE) ×2 IMPLANT
NEEDLE HYPO 18GX1.5 BLUNT FILL (NEEDLE) ×2 IMPLANT
NEEDLE HYPO 25X1 1.5 SAFETY (NEEDLE) ×4 IMPLANT
NS IRRIG 1000ML POUR BTL (IV SOLUTION) ×2 IMPLANT
PACK MINOR (CUSTOM PROCEDURE TRAY) ×2 IMPLANT
PAD ARMBOARD 7.5X6 YLW CONV (MISCELLANEOUS) ×2 IMPLANT
SET BASIN LINEN APH (SET/KITS/TRAYS/PACK) ×2 IMPLANT
SPONGE LAP 18X18 RF (DISPOSABLE) ×2 IMPLANT
SUT MNCRL AB 4-0 PS2 18 (SUTURE) ×2 IMPLANT
SUT SILK 2 0 SH (SUTURE) ×2 IMPLANT
SUT VIC AB 3-0 SH 27 (SUTURE) ×2
SUT VIC AB 3-0 SH 27X BRD (SUTURE) ×1 IMPLANT
SYR BULB IRRIG 60ML STRL (SYRINGE) ×2 IMPLANT
SYR CONTROL 10ML LL (SYRINGE) ×4 IMPLANT

## 2020-03-08 NOTE — Interval H&P Note (Signed)
History and Physical Interval Note:  03/08/2020 10:49 AM  Doris Newman  has presented today for surgery, with the diagnosis of Left Breast Cancer.  The various methods of treatment have been discussed with the patient and family. After consideration of risks, benefits and other options for treatment, the patient has consented to  Procedure(s): PARTIAL MASTECTOMY WITH NEEDLE LOCALIZATION AND AXILLARY SENTINEL LYMPH NODE BX (Left) as a surgical intervention.  The patient's history has been reviewed, patient examined, no change in status, stable for surgery.  I have reviewed the patient's chart and labs.  Questions were answered to the patient's satisfaction.     No major changes or questions.   Virl Cagey

## 2020-03-08 NOTE — Progress Notes (Signed)
Sentinel node injection at 0750 this am

## 2020-03-08 NOTE — Anesthesia Postprocedure Evaluation (Signed)
Anesthesia Post Note  Patient: Doris Newman  Procedure(s) Performed: PARTIAL MASTECTOMY WITH NEEDLE LOCALIZATION AND AXILLARY SENTINEL LYMPH NODE BX (Left Breast)  Patient location during evaluation: PACU Anesthesia Type: General Level of consciousness: awake, oriented, awake and alert and patient cooperative Pain management: pain level controlled Vital Signs Assessment: vitals unstable and post-procedure vital signs reviewed and stable Respiratory status: spontaneous breathing and nonlabored ventilation Cardiovascular status: blood pressure returned to baseline and stable Postop Assessment: no headache and no backache Anesthetic complications: no     Last Vitals:  Vitals:   03/08/20 0720  BP: 111/87  Pulse: 86  Resp: 18  Temp: 37.1 C  SpO2: 98%    Last Pain:  Vitals:   03/08/20 0720  PainSc: 0-No pain                 Tacy Learn

## 2020-03-08 NOTE — Anesthesia Preprocedure Evaluation (Signed)
Anesthesia Evaluation  Patient identified by MRN, date of birth, ID band Patient awake    Reviewed: Allergy & Precautions, NPO status , Patient's Chart, lab work & pertinent test results, reviewed documented beta blocker date and time   History of Anesthesia Complications (+) Family history of anesthesia reaction  Airway Mallampati: II  TM Distance: >3 FB Neck ROM: Full    Dental  (+) Teeth Intact, Dental Advisory Given   Pulmonary Current Smoker and Patient abstained from smoking.,    Pulmonary exam normal breath sounds clear to auscultation       Cardiovascular Exercise Tolerance: Good hypertension, Pt. on medications and Pt. on home beta blockers Normal cardiovascular exam Rhythm:Regular Rate:Normal     Neuro/Psych Anxiety    GI/Hepatic Neg liver ROS, GERD  Medicated and Controlled,  Endo/Other  negative endocrine ROS  Renal/GU negative Renal ROS     Musculoskeletal   Abdominal   Peds  Hematology negative hematology ROS (+)   Anesthesia Other Findings   Reproductive/Obstetrics                            Anesthesia Physical Anesthesia Plan  ASA: II  Anesthesia Plan: General   Post-op Pain Management:    Induction: Intravenous  PONV Risk Score and Plan: 3 and Midazolam, Ondansetron and Dexamethasone  Airway Management Planned: LMA  Additional Equipment:   Intra-op Plan:   Post-operative Plan: Extubation in OR  Informed Consent: I have reviewed the patients History and Physical, chart, labs and discussed the procedure including the risks, benefits and alternatives for the proposed anesthesia with the patient or authorized representative who has indicated his/her understanding and acceptance.     Dental advisory given  Plan Discussed with: CRNA  Anesthesia Plan Comments:        Anesthesia Quick Evaluation

## 2020-03-08 NOTE — Discharge Instructions (Signed)
Discharge instructions after breast surgery:   Common Complaints: Pain and bruising at the incision sites.  Swelling at the incision sites. Stiffness of the arm.   Diet/ Activity: Diet as tolerated.  You may shower but do not take hot showers as this can disrupt the glue. Rest and listen to your body, but do not remain in bed all day.  Walk everyday for at least 15-20 minutes. Deep cough and move around every 1-2 hours in the first few days after surgery.  Do not lift > 10 lbs for the first 2 weeks after surgery. Do not do anything that makes you feel like you are putting unnecessary pull or stretch on the incision sites.  Do move your arm and shoulder (see exercises options below). If you do not move then you can get stiff and hurt more.  Do not pick at the dermabond glue on your incision sites.  This glue film will remain in place for 1-2 weeks and will start to peel off.  Do not place lotions or balms on your incision unless instructed to specifically by Dr. Bridges.   Pain Expectations and Narcotics: -After surgery you will have pain associated with your incisions and this is normal. The pain is muscular and nerve pain, and will get better with time. -You are encouraged and expected to take non narcotic medications like tylenol and ibuprofen (when able) to treat pain as multiple modalities can aid with pain treatment. -Narcotics are only used when pain is severe or there is breakthrough pain. -You are not expected to have a pain score of 0 after surgery, as we cannot prevent pain. A pain score of 3-4 that allows you to be functional, move, walk, and tolerate some activity is the goal. The pain will continue to improve over the days after surgery and is dependent on your surgery. -Due to [place] law, we are only able to give a certain amount of pain medication to treat post operative pain, and we only give additional narcotics on a patient by patient basis.  -For most laparoscopic surgery,  studies have shown that the majority of patients only need 10-15 narcotic pills, and for open surgeries most patients only need 15-20.   -Having appropriate expectations of pain and knowledge of pain management with non narcotics is important as we do not want anyone to become addicted to narcotic pain medication.  -Using ice packs in the first 48 hours and heating pads after 48 hours, wearing an abdominal binder (when recommended), and using over the counter medications are all ways to help with pain management.   -Simple acts like meditation and mindfulness practices after surgery can also help with pain control and research has proven the benefit of these practices.  Medication: Take tylenol and ibuprofen as needed for pain control, alternating every 4-6 hours.  Example:  Tylenol 1000mg @ 6am, 12noon, 6pm, 12midnight (Do not exceed 4000mg of tylenol a day). Ibuprofen 800mg @ 9am, 3pm, 9pm, 3am (Do not exceed 3600mg of ibuprofen a day).  Take Roxicodone for breakthrough pain every 4 hours.  Take Colace for constipation related to narcotic pain medication. If you do not have a bowel movement in 2 days, take Miralax over the counter.  Drink plenty of water to also prevent constipation.   Contact Information: If you have questions or concerns, please call our office, [phone], Monday- Thursday 8AM-5PM and Friday 8AM-12Noon.  If it is after hours or on the weekend, please call [facility], [phone], and   ask to speak to the surgeon on call for Dr. Constance Haw at [facility].   Exercises After Breast Surgery Do at least a few of the exercises below twice a day. It is ok to start the day after surgery and gradually build up the amount and type of exercises you do. Link to the exercises with pictures ([email or web address]).   Deep Breathing Exercise Deep breathing can help you relax and ease discomfort and tightness around your  incision (surgical cut). It's also a very good way to relieve stress during the day.  Sit comfortably in a chair. Take a slow, deep breath through your nose. Let your chest and belly expand. Breathe out slowly through your mouth. Repeat as many times as needed.  Arm and Shoulder Exercises Doing arm and shoulder exercises will help you get back your full range of motion on your affected side (the side where you had your surgery). With full range of motion, you'll be able to: Move your arm over your head and out to the side Move your arm behind your neck Move your arm to the middle of your back Do each of the exercises below 5 times a day. Keep doing this until you have a full range of motion again and can use your arm as you did before surgery in all your normal activities. This includes activities at work, at home, and in recreation or sports. If you had limited movement in your arm before surgery, your goal will be to get back as much movement as you had before.  If you get your full range of motion back quickly, keep doing these exercises once a day instead of 5 times a day. This is especially true if you feel any tightness in your chest, shoulder, or under your affected arm. These exercises can help keep scar tissue from forming in your armpit and shoulder. Scar tissue can limit your arm movements later.  If you still have trouble moving your shoulder 4 weeks after your surgery, tell your surgeon. They'll tell you if you need more rehabilitation, such as physical or occupational therapy.  If you had one of the following surgeries, you can do the following set of exercises on the first day after your surgery, as long as your surgeon tells you it's safe.  Shoulder rolls The shoulder roll is a good exercise to start with because it gently stretches your chest and shoulder muscles.  Stand or sit comfortably with your arms relaxed at your sides. Start with backward shoulder rolls. In a circular  motion, bring your shoulders forward, up, backward, and down. Do this 10 times. Switch directions and do 10 forward shoulder rolls. Bring your shoulders backward, up, forward, and down. Do this 10 times. Try to make the circles as big as you can and move both shoulders at the same time. If you have some tightness across your incision or chest, start with smaller circles and make them bigger as the tightness decreases. The backward direction might feel a little tighter across your chest than the forward direction. This will get better with practice.  Shoulder wings The shoulder wings exercise will help you get back outward movement of your shoulder. You can do this exercise while sitting or standing.  Place your hands on your chest or collarbone. Raise your elbows out to the side, limiting your range of motion as instructed by your healthcare team. Slowly lower your elbows. Do this 10 times. Then, slowly lower your hands. If you feel discomfort  while doing this exercise, hold your position and do the deep breathing exercise. If the discomfort passes, raise your elbows a little higher. If it doesn't pass, don't raise your elbows any higher. Finish the exercise raising your elbows only high enough to feel a gentle stretch and no discomfort.  Arm circles If you had surgery on both breasts, do this exercise with both arms, 1 arm at a time. Don't do this exercise with both arms at the same time. This will put too much pressure on your chest.  Stand with your feet slightly apart for balance. Raise your affected arm out to the side as high as you can, limiting your range of movement as instructed by your healthcare team. Start making slow, backward circles in the air with your arm. Make sure you're moving your arm from your shoulder, not your elbow. Keep your elbow straight. Increase the size of the circles until they're as big as you can comfortably make them, limiting your range of motion as instructed  by your healthcare team. If you feel any aching or if your arm is tired, take a break. Keep doing the exercise when you feel better. Do 10 full backward circles. Then, slowly lower your arm to your side. Rest your arm for a moment. Follow steps 1 to 4 again, but this time make slow, forward circles.  W exercise You can do the W exercise while sitting or standing.  Form a "W" with your arms out to the side and palms facing forward (see Figure 4). Try to bring your hands up so they're even with your face. If you can't raise your arms that high, bring them to the highest comfortable position. Make sure to limit your range of motion as instructed by your healthcare team. Pinch your shoulder blades together and downward, as if you're squeezing a pencil between them. If you feel discomfort, stop at that position and do the deep breathing exercise. If the discomfort passes, try to bring your arms back a little further. If it doesn't pass, don't reach any further. Hold the furthest position that doesn't cause discomfort. Squeeze your shoulder blades together and downward for 5 seconds. Slowly bring your arms back down to the starting position. Repeat this movement 10 times.  Back Climb You can do the back climb stretch while sitting or standing. You'll need a timer or stopwatch.  Place your hands behind your back. Hold the hand on your affected side with your other hand. If you had surgery on both breasts, use the arm that moves most easily to hold the other. Slowly slide your hands up the center of your back as far as you can. If you feel tightness near your incision, stop at that position and do the deep breathing exercise. If the tightness decreases, try to slide your hands up a little further. If it doesn't decrease, don't slide your hands up any further. Hold the highest position you can for 1 minute. Use your stopwatch or timer to keep track. You should feel a gentle stretch in your shoulder  area. After 1 minute, slowly lower your hands.  Hands behind neck You can do the hands behind neck stretch while sitting or standing. You'll need a timer or stopwatch.  Clasp your hands together on your lap or in front of you. Slowly raise your hands toward your head, keeping your elbows together in front of you, not out to the sides. Keep your head level. Don't bend your neck or head   forward. Slide your hands over your head until you reach the back of your neck. When you get to this point, spread your elbows out to the sides. Hold this position for 1 minute. Use your stopwatch or timer to keep track. Breathe normally. Don't hold your breath as you stretch your body. If you have some tightness across your incision or chest, hold your position and do the deep breathing exercise. If the tightness decreases, continue with the movement. If the tightness stays the same, reach up and stretch your elbows back as best as you can without causing discomfort. Hold the position you're most comfortable in for 1 minute. Slowly come out of the stretch by bringing your elbows together and sliding your hands over your head. Then, slowly lower your arms.  Forward wall crawls You'll need 2 pieces of tape for the forward wall crawl exercise.  Stand facing a wall. Your toes should be about 6 inches (15 centimeters) from the wall. Reach as high as you can with your unaffected arm. [place] that point with a piece of tape. This will be the goal for your affected arm. If you had surgery on both breasts, set your goal using the arm that moves most comfortably. Place both hands against the wall at a level that's comfortable. Crawl your fingers up the wall as far as you can, keeping them even with each other.. Try not to look up toward your hands or arch your back. When you get to the point where you feel a good stretch, but not pain, do the deep breathing exercise. Return to the starting position by crawling your fingers back  down the wall. Repeat the wall crawl 10 times. Each time you raise your hands, try to crawl a little bit higher. On the 10th crawl, use the other piece of tape to mark the highest point you reached with your affected arm. This will let you to see your progress each time you do this exercise. As you become more flexible, you may need to take a step closer to the wall so you can reach a little higher.   Side wall crawls You'll also need 2 pieces of tape for the side wall crawl exercise.  You shouldn't feel pain while doing this exercise. It's normal to feel some tightness or pulling across the side of your chest. Focus on your breathing until the tightness decreases. Breathe normally throughout this exercise. Don't hold your breath.  Be careful not to turn your body toward the wall while doing this exercise. Make sure only the side of your body faces the wall.  If you had surgery on both breasts, start with step 3.  Stand with your unaffected side closest to the wall, about 1 foot (30.5 centimeters) away from the wall. Reach as high as you can with your unaffected arm. [place] that point with a piece of tape (see Figure 8). This will be the goal for your affected arm. Turn your body so your affected side is now closest to the wall. If you had surgery on both breasts, start with either side closest to the wall. Crawl your fingers up the wall as far as you can. When you get to the point where you feel a good stretch, but not pain, do the deep breathing exercise. Return to the starting position by crawling your fingers back down the wall. Repeat this exercise 10 times. On your 10th crawl, use a piece of tape to mark the highest point you reached  with your affected arm. This will let you see your progress each time you do the exercise. If you had surgery on both breasts, repeat the exercise with your other arm.  Swelling After your surgery, you may have some swelling or puffiness in your hand or arm on  your affected side. This is normal and usually goes away on its own.  If you notice swelling in your hand or arm, follow the tips below to help the swelling go away.  Raise your arm above your head and do hand pumps several times a day. To do hand pumps, slowly open and close your fist 10 times. This will help drain the fluid out of your arm. Don't hold your arm straight up over your head for more than a few minutes. This can cause your arm muscles to get tired. Raise your arm to the side a few times a day for about 20 minutes at a time. To do this, sit or lie down on your back. Rest your arm on a few pillows next to you so it's raised above the level of your heart. If you're able to sleep on your unaffected side, you can place 1 or 2 pillows in front of you and rest your affected arm on them while you sleep. If the swelling doesn't go down within 4 to 6 weeks, call your surgeon or nurse.   Lumpectomy, Care After This sheet gives you information about how to care for yourself after your procedure. Your health care provider may also give you more specific instructions. If you have problems or questions, contact your health care provider. What can I expect after the procedure? After the procedure, it is common to have:  Breast swelling.  Breast tenderness.  Stiffness in your arm or shoulder.  A change in the shape and feel of your breast.  Scar tissue that feels hard to the touch in the area where the lump was removed. Follow these instructions at home: Medicines  Take over-the-counter and prescription medicines only as told by your health care provider.  If you were prescribed an antibiotic medicine, take it as told by your health care provider. Do not stop taking the antibiotic even if you start to feel better.  Ask your health care provider if the medicine prescribed to you: ? Requires you to avoid driving or using heavy machinery. ? Can cause constipation. You may need to take  these actions to prevent or treat constipation:  Drink enough fluid to keep your urine pale yellow.  Take over-the-counter or prescription medicines.  Eat foods that are high in fiber, such as beans, whole grains, and fresh fruits and vegetables.  Limit foods that are high in fat and processed sugars, such as fried or sweet foods. Incision care      Follow instructions from your health care provider about how to take care of your incision. Make sure you: ? Wash your hands with soap and water before and after you change your bandage (dressing). If soap and water are not available, use hand sanitizer. ? Change your dressing as told by your health care provider. ? Leave stitches (sutures), skin glue, or adhesive strips in place. These skin closures may need to stay in place for 2 weeks or longer. If adhesive strip edges start to loosen and curl up, you may trim the loose edges. Do not remove adhesive strips completely unless your health care provider tells you to do that.  Check your incision area every day  for signs of infection. Check for: ? More redness, swelling, or pain. ? Fluid or blood. ? Warmth. ? Pus or a bad smell.  Keep your dressing clean and dry.  If you were sent home with a surgical drain in place, follow instructions from your health care provider about emptying it. Bathing  Do not take baths, swim, or use a hot tub until your health care provider approves.  Ask your health care provider if you may take showers. You may only be allowed to take sponge baths. Activity  Rest as told by your health care provider.  Avoid sitting for a long time without moving. Get up to take short walks every 1-2 hours. This is important to improve blood flow and breathing. Ask for help if you feel weak or unsteady.  Return to your normal activities as told by your health care provider. Ask your health care provider what activities are safe for you.  Be careful to avoid any activities  that could cause an injury to your arm on the side of your surgery.  Do not lift anything that is heavier than 10 lb (4.5 kg), or the limit that you are told, until your health care provider says that it is safe. Avoid lifting with the arm that is on the side of your surgery.  Do not carry heavy objects on your shoulder on the side of your surgery.  Do exercises to keep your shoulder and arm from getting stiff and swollen. Talk with your health care provider about which exercises are safe for you. General instructions  Wear a supportive bra as told by your health care provider.  Raise (elevate) your arm above the level of your heart while you are sitting or lying down.  Do not wear tight jewelry on your arm, wrist, or fingers on the side of your surgery.  Keep all follow-up visits as told by your health care provider. This is important. ? You may need to be screened for extra fluid around the lymph nodes and swelling in the breast and arm (lymphedema). Follow instructions from your health care provider about how often you should be checked.  If you had any lymph nodes removed during your procedure, be sure to tell all of your health care providers. This is important information to share before you are involved in certain procedures, such as having blood tests or having your blood pressure taken. Contact a health care provider if:  You develop a rash.  You have a fever.  Your pain medicine is not working.  You have swelling, weakness, or numbness in your arm that does not improve after a few weeks.  You have new swelling in your breast.  You have any of these signs of infection: ? More redness, swelling, or pain in your incision area. ? Fluid or blood coming from your incision. ? Warmth coming from the incision area. ? Pus or a bad smell coming from your incision. Get help right away if you have:  Very bad pain in your breast or arm.  Swelling in your legs or arms.  Redness,  warmth, or pain in your leg or arm.  Chest pain.  Difficulty breathing. Summary  After the procedure, it is common to have breast tenderness, swelling in your breast, and stiffness in your arm and shoulder.  Follow instructions from your health care provider about how to take care of your incision.  Do not lift anything that is heavier than 10 lb (4.5 kg), or  the limit that you are told, until your health care provider says that it is safe. Avoid lifting with the arm that is on the side of your surgery.  If you had any lymph nodes removed during your procedure, be sure to tell all of your health care providers. This is important information to share before you are involved in certain procedures, such as having blood tests or having your blood pressure taken. This information is not intended to replace advice given to you by your health care provider. Make sure you discuss any questions you have with your health care provider. Document Revised: 04/07/2019 Document Reviewed: 04/07/2019 Elsevier Patient Education  [place] Lymph Node Biopsy, Care After This sheet gives you information about how to care for yourself after your procedure. Your health care provider may also give you more specific instructions. If you have problems or questions, contact your health care provider. What can I expect after the procedure? After the procedure, it is common to have:  Blue urine or stool for the next 24-48 hours. This is normal. It is caused by the dye used during the procedure.  Blue skin at the injection site. This may last for up to 8 weeks.  Numbness, tingling, or pain near your incision site.  Swelling or bruising near your incision. Follow these instructions at home: Incision care      Follow instructions from your health care provider about how to take care of your incision. Make sure you: ? Wash your hands with soap and water before you change your bandage  (dressing). If soap and water are not available, use hand sanitizer. ? Change your dressing as told by your health care provider. ? Leave stitches (sutures), skin glue, or adhesive strips in place. These skin closures may need to stay in place for 2 weeks or longer. If adhesive strip edges start to loosen and curl up, you may trim the loose edges. Do not remove adhesive strips completely unless your health care provider tells you to do that.  Check your incision area every day for signs of infection. Check for: ? Redness, swelling, or pain. ? Fluid or blood. ? Warmth. ? Pus or a bad smell.  Do not take baths, swim, or use a hot tub until your health care provider approves. Ask your health care provider if you can take showers. You may be able to shower 24 hours after your procedure. After a shower, pat the incision area dry with a clean towel. Do not rub the incision. That could cause bleeding. Activity  Avoid activities that take a lot of effort.  Return to your normal activities as told by your health care provider. Ask your health care provider what activities are safe for you. General instructions  Take over-the-counter and prescription medicines only as told by your health care provider.  You may resume your regular diet.  If the procedure was done on or near the lymph nodes under your arm (axillary lymph nodes), do not have your blood pressure taken or have blood drawn from the arm on the side of the biopsy until your health care provider says it is okay.  You may need to be screened for extra fluid around the lymph nodes (lymphedema). Follow instructions from your health care provider about how often you should be checked.  Keep all follow-up visits as told by your health care provider. This is important. Contact a health care provider if:  Your pain medicine is  not helping.  You have more redness, swelling, or pain around your biopsy site.  You have more fluid or blood coming  from your incision.  Your incision feels warm to the touch.  You have pus or a bad smell coming from your incision.  You have nausea and vomiting.  You have any new bruising.  You have chills or a fever. Get help right away if:  You have pain that is getting worse, and your medicine is not helping.  You have vomiting that will not stop.  You have chest pain or trouble breathing. Summary  After the procedure, it is common to have blue urine or stool for the next 24-48 hours.  Follow instructions from your health care provider about how to take care of your incision. Check your incision area every day for signs of infection.  Take over-the-counter and prescription medicines only as told by your health care provider.  Ask your health care provider when you can return to your normal activities. This information is not intended to replace advice given to you by your health care provider. Make sure you discuss any questions you have with your health care provider. Document Revised: 10/19/2017 Document Reviewed: 10/15/2017 Elsevier Patient Education  2020 [place] [facility], Adult, Care After This sheet gives you information about how to care for yourself after your procedure. Your health care provider may also give you more specific instructions. If you have problems or questions, contact your health care provider. What can I expect after the procedure? After the procedure, the following side effects are common:  Pain or discomfort at the IV site.  Nausea.  Vomiting.  Sore throat.  Trouble concentrating.  Feeling cold or chills.  Weak or tired.  Sleepiness and fatigue.  Soreness and body aches. These side effects can affect parts of the body that were not involved in surgery. Follow these instructions at home:  For at least 24 hours after the procedure:  Have a responsible adult stay with you. It is important to have someone help care for you  until you are awake and alert.  Rest as needed.  Do not: ? Participate in activities in which you could fall or become injured. ? Drive. ? Use heavy machinery. ? Drink alcohol. ? Take sleeping pills or medicines that cause drowsiness. ? Make important decisions or sign legal documents. ? Take care of children on your own. Eating and drinking  Follow any instructions from your health care provider about eating or drinking restrictions.  When you feel hungry, start by eating small amounts of foods that are soft and easy to digest (bland), such as toast. Gradually return to your regular diet.  Drink enough fluid to keep your urine pale yellow.  If you vomit, rehydrate by drinking water, juice, or clear broth. General instructions  If you have sleep apnea, surgery and certain medicines can increase your risk for breathing problems. Follow instructions from your health care provider about wearing your sleep device: ? Anytime you are sleeping, including during daytime naps. ? While taking prescription pain medicines, sleeping medicines, or medicines that make you drowsy.  Return to your normal activities as told by your health care provider. Ask your health care provider what activities are safe for you.  Take over-the-counter and prescription medicines only as told by your health care provider.  If you smoke, do not smoke without supervision.  Keep all follow-up visits as told by your health care provider.  This is important. Contact a health care provider if:  You have nausea or vomiting that does not get better with medicine.  You cannot eat or drink without vomiting.  You have pain that does not get better with medicine.  You are unable to pass urine.  You develop a skin rash.  You have a fever.  You have redness around your IV site that gets worse. Get help right away if:  You have difficulty breathing.  You have chest pain.  You have blood in your urine or stool,  or you vomit blood. Summary  After the procedure, it is common to have a sore throat or nausea. It is also common to feel tired.  Have a responsible adult stay with you for the first 24 hours after general anesthesia. It is important to have someone help care for you until you are awake and alert.  When you feel hungry, start by eating small amounts of foods that are soft and easy to digest (bland), such as toast. Gradually return to your regular diet.  Drink enough fluid to keep your urine pale yellow.  Return to your normal activities as told by your health care provider. Ask your health care provider what activities are safe for you. This information is not intended to replace advice given to you by your health care provider. Make sure you discuss any questions you have with your health care provider. Document Revised: 10/05/2017 Document Reviewed: 05/18/2017 Elsevier Patient Education  [place].

## 2020-03-08 NOTE — Transfer of Care (Signed)
Immediate Anesthesia Transfer of Care Note  Patient: Doris Newman  Procedure(s) Performed: PARTIAL MASTECTOMY WITH NEEDLE LOCALIZATION AND AXILLARY SENTINEL LYMPH NODE BX (Left Breast)  Patient Location: PACU  Anesthesia Type:General  Level of Consciousness: awake, alert , oriented, patient cooperative and responds to stimulation  Airway & Oxygen Therapy: Patient Spontanous Breathing and Patient connected to nasal cannula oxygen  Post-op Assessment: Report given to RN and Post -op Vital signs reviewed and stable  Post vital signs: Reviewed and stable  Last Vitals:  Vitals Value Taken Time  BP 126/107 03/08/20 1351  Temp    Pulse 89 03/08/20 1350  Resp 14 03/08/20 1351  SpO2 82 % 03/08/20 1350  Vitals shown include unvalidated device data.  Last Pain:  Vitals:   03/08/20 0720  PainSc: 0-No pain      Patients Stated Pain Goal: 7 [phone] [phone])  Complications: No apparent anesthesia complications

## 2020-03-08 NOTE — Op Note (Signed)
[facility] Operative Note  03/08/20  Preoperative Diagnosis: Left breast cancer    Postoperative Diagnosis: Same   Procedure(s) Performed:  Left breast partial mastectomy after needle localization, sentinel node biopsy with blue dye injection    Surgeon: Lanell Matar. Constance Haw, MD   Assistants: No qualified resident was available    Anesthesia: General endotracheal   Anesthesiologist: Denese Killings, MD    Specimens:  Sentinel nodes; Left breast tissue; additional margins superior, deep, inferior, medial and lateral    Estimated Blood Loss: Minimal   Blood Replacement: None    Complications: None   Wound Class: Clean   Operative Indications: Ms. Skeens is a 49 yo with a newly diagnosed left breast cancer. We discussed partial mastectomy and sentinel node biopsy with plans for post operative radiation. We discussed the risk of bleeding, infection, need for further surgery, and possibility of needing chemotherapy.  She opted to proceed.   Findings: Fibrocystic breast tissue    Procedure: The patient was taken to the operating room and placed supine. She had previously been to radiology for radiotracer around her nipple and also for needle localization of the left breast cancer.  General endotracheal anesthesia was induced. Intravenous antibiotics were administered per protocol.  The mammogram images from the needle localization were reviewed noting the trajectory and location of the needle to the biopsy clip.  The wire was trimmed to prevent accidental dislodgement. The blue dye was injected around the areola at the 12 o'clock, 3 o'clock, 6 o'clock and 9 o'clock positions.  This was massaged in place for over 5 minutes.    The left breast and axilla was prepared and draped in the usual sterile fashion.  The handheld gamma probe was tested and was functional. The gamma probe was placed at the inferior hairline and the hottest spot on the skin was recorded at  320 counts.   The incision was made and carried down through the tissue .  Once the incision was opened the counts of the background was 558.  A hot blue lymph node was identified with in [place] 130[address]. It was excised using a combination of blunt and sharp dissection and clips were placed to control bleeding and lymph drainage.  The ex vivo counts were 6143.  The new background count was 1230.  An additional hot node that was not blue was identified with in vivo counts of 3175.  This was excised in a similar fashion and the ex vivo count was 2135. The final background count was 186. Hemostasis of the axilla was confirmed and a laparotomy pad was placed.  Attention was then turned to the breast and an circumareolar incision was made including the wire in the incision. Flaps were created and a stay suture of 3-0 Silk was placed to help with traction of the specimen.  Using care, the partial mastectomy was completed and a wide margin of normal tissue was included with the specimen. This was sent to radiology and the biopsy clip and lesion were in the specimen.  Additional margins of the superior, inferior, medial, lateral, and deep parts of the cavity were taken. There was no additional superficial margin to take.  The cavity was made hemostatic.   The axilla was closed deep with a 3-0 Vicryl interrupted and a running subcuticular 4-0 Monocryl.  The partial mastectomy site dermis was closed with a 3-0 Vicryl interrupted but the cavity was left open for seroma formation for cosmesis.  The skin was closed  with 4-0 Monocryl running subcuticular. The incisions were injected with local anesthetic. The skin was closed with dermabond on the axilla and breast site.   Final inspection revealed acceptable hemostasis. All counts were correct at the end of the case. The patient was awakened from anesthesia and extubated without complication.  The patient went to the PACU in stable condition.   Curlene Labrum,  MD [facility] [address] [place], [place] [address] [phone] (office)

## 2020-03-08 NOTE — H&P (Signed)
[facility] History and Physical  Reason for Referral: Left breast invasive carcinoma  Referring Physician: Karma Ganja NP (Health Department)     Chief Complaint     New Patient (Initial Visit)      Doris Newman is a 49 y.o. female.  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 She underwent a mammogram after being seen at the [facility]. Doris Newman did note a possible left breast mass and sent the patient for imaging. The patient had a prior mammogram 4 years ago, but not annually and this was reported to be normal at that time. The patient has no prior history of any masses, lumps, bumps, nipple changes or discharge. She had menarche at age 25, and her first pregnancy at age 53. She is G2P1. She has a history of family breast cancer in her paternal grandmother and prostate cancer in her father. She is currently undergoing menopause. She has never had any previous biopsies or concerning areas on mammogram. She has not had any chest radiation.  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 She does have a history of anxiety and gets a rapid heart rate but denies being anxious when these random events occur. She has never had any know cardiac issues and has no EKG on file. She says she has some SOB with walking and does not do much strenuous physical activity.      Past Medical History:  Diagnosis Date  . Anxiety   . GERD (gastroesophageal reflux disease)   . Hypertension         Past Surgical History:  Procedure Laterality Date  . LEEP          Family History  Problem Relation Age of Onset  . Prostate  cancer Father   . Breast cancer Paternal Grandmother    Social History        Tobacco Use  . Smoking status: Current Every Day Smoker  . Smokeless tobacco: Never Used  Substance Use Topics  . Alcohol use: Yes    Comment: occasional  . Drug use: No   Medications: I have reviewed the patient's current medications.  Allergies as of 02/19/2020   No Known Allergies          Medication List       Accurate as of Feb 19, 2020 11:59 PM. If you have any questions, ask your nurse or doctor.        STOP taking these medications    baclofen 10 MG tablet  Commonly known as: LIORESAL  Stopped by: Virl Cagey, MD   diclofenac 75 MG EC tablet  Commonly known as: VOLTAREN  Stopped by: Virl Cagey, MD   norethindrone 0.35 MG tablet  Commonly known as: MICRONOR  Stopped by: Virl Cagey, MD   predniSONE 10 MG (21) Tbpk tablet  Commonly known as: STERAPRED UNI-PAK 21 TAB  Stopped by: Virl Cagey, MD       TAKE these medications    ALPRAZolam 1 MG tablet  Commonly known as: XANAX  Take 1 mg by mouth 4 (four)  times daily.   citalopram 20 MG tablet  Commonly known as: CELEXA  Take 20 mg by mouth daily.   enalapril 10 MG tablet  Commonly known as: VASOTEC  Take 1 tablet (10 mg total) by mouth daily.   GOODY HEADACHE PO  Take 1 packet by mouth every 12 (twelve) hours as needed. headache   hydrochlorothiazide 25 MG tablet  Commonly known as: HYDRODIURIL  Take 1 tablet (25 mg total) by mouth daily.   ibuprofen 800 MG tablet  Commonly known as: ADVIL  Take 1 tablet (800 mg total) by mouth every 8 (eight) hours as needed.   mirtazapine 15 MG tablet  Commonly known as: REMERON   omeprazole 20 MG capsule  Commonly known as: PRILOSEC  Take 1 capsule (20 mg total) by mouth daily.   propranolol 10 MG tablet  Commonly known as: INDERAL  Take 10 mg by mouth daily.       ROS:  A comprehensive review of systems was negative except for: Respiratory: positive  for SOB  Cardiovascular: positive for chest pain and varicose veins  Blood pressure 102/67, pulse 82, temperature 97.8 F (36.6 C), temperature source Oral, resp. rate 12, height 5\' 6"  (1.676 m), weight 173 lb (78.5 kg), SpO2 97 %.  Physical Exam  Vitals reviewed. Exam conducted with a chaperone present.  HENT:  Head: Normocephalic and atraumatic.  Nose: Nose normal.  Mouth/Throat:  Mouth: Mucous membranes are moist.  Eyes:  Extraocular Movements: Extraocular movements intact.  Pupils: Pupils are equal, round, and reactive to light.  Cardiovascular:  Rate and Rhythm: Normal rate and regular rhythm.  Pulmonary:  Effort: Pulmonary effort is normal.  Breath sounds: Normal breath sounds.  Chest:  Breasts:  Right: No inverted nipple, mass, nipple discharge, skin change or tenderness.  Left: Mass present. No inverted nipple, nipple discharge, skin change or tenderness.  Comments: Left lateral breast with some slightly palpable area but difficult to discern, deep Abdominal:  General: There is no distension.  Palpations: Abdomen is soft.  Tenderness: There is no abdominal tenderness.  Musculoskeletal:  General: No swelling. Normal range of motion.  Cervical back: Normal range of motion. No rigidity.  Skin:  General: Skin is warm and dry.  Neurological:  General: No focal deficit present.  Mental Status: She is alert and oriented to person, place, and time.  Psychiatric:  Mood and Affect: Mood normal.  Behavior: Behavior normal.  Thought Content: Thought content normal.  Judgment: Judgment normal.   Results:      Assessment & Plan:  Doris Newman is a 49 y.o. female with a left invasive ductal carcinoma that is newly diagnosed. She says she has not felt this mass. The area is slightly palpable but moves and feels deep. Given the location and difficulty in palpating she will need a needle guided excision.  We have discussed the options for surgery including the option of  mastectomy with sentinel node biopsy versus partial mastectomy (lumpectomy) with sentinel node biopsy. We have discussed that there is no difference in the prognosis or chance or recurrence or differences in survival between the two options. We have discussed the need for radiation with the lumpectomy, and we have discussed that she will be referred to oncology after our procedure to further discuss her options for chemotherapy and hormonal therapy if she qualifies.  We have discussed that if she decides to have a lumpectomy that we will need to get a needle placed into the area where the biopsy was  performed, since we cannot palpate a mass with great consistency. We have also discussed the need for injection of radiotracer and blue dye to perform the sentinel node biopsy.  We have discussed that the sentinel node biopsy tells [place] if the cancer has spread to the lymph nodes, and can help with plans for chemotherapy treatment and overall prognosis.  We have discussed that if the lumpectomy does not remove the entire cancer that she may have to have an additional procedure, and we have discussed that a positive sentinel node can require further removal of lymph nodes from the axilla but that recent research does not show any improvement in disease free survival and carries greater risk for lymphedema.  We have discussed that these are big discussions, and that the risk from the operations are similar including risk of bleeding, risk of infection, and risk of needing additional surgeries. We have discussed the likely need for an overnight stay with a mastectomy and a drain that will remain in place for about 1 week.  Given her chest pain and reported episodes that have come on without real inciting events. I thought about ordering a EKG but felt that this will likely need additional testing and will delay her surgery. We have sent her for risk stratification with cardiology.  COVID testing preop discussed    Discussed FMLA and doing this at the time of surgery. Discussed that most people can return to work after surgery in 2 weeks, but that she may need longer due to the radiation and that the oncology team usually takes over the [facility]. Discussed that she should not worry about this and that if there are issues I will continue the FMLA if needed. Discussed that chemotherapy would be determined once we have the final pathology.  After she sees cardiology, we will get her scheduled ASAP. She is unable to do surgery on 5/19 or 5/20 due to a prior appointment that needs to be kept with her psychiatrist.  All questions were answered to the satisfaction of the patient and family.  Virl Cagey  02/20/2020, 3:08 PM

## 2020-03-08 NOTE — OR Nursing (Signed)
Up to bathroom to void 

## 2020-03-09 ENCOUNTER — Encounter (HOSPITAL_COMMUNITY): Payer: BC Managed Care – PPO

## 2020-03-11 ENCOUNTER — Telehealth (INDEPENDENT_AMBULATORY_CARE_PROVIDER_SITE_OTHER): Payer: Self-pay | Admitting: General Surgery

## 2020-03-11 DIAGNOSIS — C50912 Malignant neoplasm of unspecified site of left female breast: Secondary | ICD-10-CM

## 2020-03-11 LAB — SURGICAL PATHOLOGY

## 2020-03-11 NOTE — Addendum Note (Signed)
Addendum  created 03/11/20 1155 by Ollen Bowl, CRNA   Charge Capture section accepted

## 2020-03-11 NOTE — Telephone Encounter (Signed)
FINAL MICROSCOPIC DIAGNOSIS:   A. BREAST, LEFT, PARTIAL MASTECTOMY:  - Invasive ductal carcinoma, grade I/III, spanning 2.1 cm.  - Ductal carcinoma in situ with calcifications, intermediate grade.  - Lobular neoplasia (atypical lobular hyperplasia).  - Invasive carcinoma is broadly less than 0.1 cm to the medial margin of  specimen A.  - See oncology table below.   B. LYMPH NODE, SENTINEL, LEFT AXILLA, BIOPSY:  - There is no evidence of carcinoma in 3 of 3 lymph nodes (0/3).  - See comment.   C. BREAST, LEFT, PARTIAL, LATERAL MARGIN, EXCISION:  - Benign breast parenchyma.  - There is no evidence of malignancy.   D. BREAST, LEFT, PARTIAL, MEDIAL MARGIN, EXCISION:  - Benign breast parenchyma.  - There is no evidence of malignancy.   E. BREAST, LEFT, PARTIAL, SUPERIOR MARGIN, EXCISION:  - Benign breast parenchyma.  - There is no evidence of malignancy.   F. BREAST, LEFT, PARTIAL, INFERIOR MARGIN, EXCISION:  - Benign breast parenchyma.  - There is no evidence of malignancy.   G. BREAST, LEFT, PARTIAL, DEEP MARGIN, EXCISION:  - Benign breast parenchyma.  - There is no evidence of malignancy.   ONCOLOGY TABLE:   INVASIVE CARCINOMA OF THE BREAST: Resection   Procedure: Partial mastectomy  Specimen Laterality: Left  Tumor Size: 2.1 cm  Histologic Type: Ductal  Histologic Grade:    Glandular (Acinar)/Tubular Differentiation: 2    Nuclear Pleomorphism: 2    Mitotic Rate: 1    Overall Grade: I  Ductal Carcinoma In Situ: Present, intermediate grade  Tumor Extension: Limited to breast parenchyma  Margins: Greater than 0.2 cm to all final surgical resection margins.  DCIS Margins: Greater than 0.2 cm to all final surgical resection  margins.  Regional Lymph Nodes: Negative for carcinoma    Number of Lymph Nodes Examined: 3    Number of Sentinel Nodes Examined (if applicable): 3    Number of Lymph Nodes with Macrometastases (>2 mm): 0    Number of  Lymph Nodes with Micrometastases: 0    Number of Lymph Nodes with Isolated Tumor Cells (=0.2 mm or =200  cells): 0  Treatment Effect: No known presurgical therapy  Breast Biomarker Testing Performed on Previous Biopsy: [phone]        Estrogen Receptor: 95%       Progesterone Receptor: 70%       HER2: No amplification was detected       Ki-67: 10%  Representative Tumor Block: A1  Pathologic Stage Classification (pTNM, AJCC 8th Edition): pT2, pN0  Comments: An immunohistochemical stain performed on part B fails to  highlight the presence of cytokeratin positive tumor cells.  (v4.4.0.0)

## 2020-03-17 ENCOUNTER — Telehealth: Payer: Self-pay | Admitting: Family Medicine

## 2020-03-17 MED ORDER — OXYCODONE HCL 5 MG PO TABS: 5.0000 mg | ORAL_TABLET | ORAL | 0 refills | Status: DC | PRN

## 2020-03-17 NOTE — Telephone Encounter (Signed)
Pt called - Detailed message left on vm in regards to tylenol and ibuprofen and that it could be discussed further at her apt next if needed.

## 2020-03-17 NOTE — Telephone Encounter (Signed)
Patient requesting a refill on Oxycodone     LOV:  03/11/2020  LRF: 03/08/2020  Next OV: 03/23/2020

## 2020-03-17 NOTE — Telephone Encounter (Signed)
[facility]  Refill of roxicodone sent in. Needs to use ibuprofen and tylenol too. Needs to be weaning off.  Curlene Labrum, MD [facility] [address] [place], [place] [address] [phone] (office)

## 2020-03-23 ENCOUNTER — Other Ambulatory Visit: Payer: Self-pay

## 2020-03-23 ENCOUNTER — Encounter: Payer: Self-pay | Admitting: General Surgery

## 2020-03-23 ENCOUNTER — Ambulatory Visit (INDEPENDENT_AMBULATORY_CARE_PROVIDER_SITE_OTHER): Payer: Self-pay | Admitting: General Surgery

## 2020-03-23 VITALS — BP 139/93 | HR 77 | Temp 98.3°F | Resp 18 | Ht 66.0 in | Wt 170.0 lb

## 2020-03-23 DIAGNOSIS — C50912 Malignant neoplasm of unspecified site of left female breast: Secondary | ICD-10-CM

## 2020-03-23 NOTE — Progress Notes (Signed)
[facility] Note   HPI:  49 y.o. Female presents to clinic for post-op follow-up evaluation of her left breast partial mastectomy and sentinel node. She is doing well and pain and soreness is improving. She is worried about her FMLA and disability. . Patient reports no fever or chills.   Review of Systems:  No redness or drainage Improving soreness Some blue dye on breast incision still  All other review of systems: otherwise negative   Vital Signs:  BP (!) 139/93   Pulse 77   Temp 98.3 F (36.8 C) (Other (Comment))   Resp 18   Ht 5\' 6"  (1.676 m)   Wt 170 lb (77.1 kg)   LMP 02/29/2020   SpO2 98%   BMI 27.44 kg/m    Physical Exam:  Physical Exam Vitals reviewed.  HENT:     Head: Normocephalic.  Cardiovascular:     Rate and Rhythm: Normal rate.  Pulmonary:     Effort: Pulmonary effort is normal.  Chest:     Comments: Left breast incision healing with dermabond in place, some blue dye from the sentinel node staining the dermabond, otherwise has absorbed by the body, no erythema or drainage, axillary incision healing, no erythema or drainage, some minor induration, dermabond peeled off  Neurological:     Mental Status: She is alert.     Assessment:  49 y.o. yo Female with left breast invasive ductal carcinoma, 2.1 cm, margin negative and node negative.   Plan:  - Return to work from a surgery standpoint on 04/26/2020 without restrictions. - This could be changed pending oncology's recommendations.  Follow up with oncology and radiation oncology.  Will return to work from surgery standpoint 04/26/2020 (6 weeks+ after surgery). Oncology/ radiation oncology can extend time off pending their treatments/ needs.  Hold birth control for now. Discuss with Dr. Delton Coombes  -Will see PRN, discussed that her cancer was 2.1cm and she will discuss with Dr. Delton Coombes further treatment and possible need for any chemotherapy based on her risk. If she needs a port, we  can place that as needed.   All of the above recommendations were discussed with the patient and patient's family, and all of patient's and family's questions were answered to their expressed satisfaction.  Curlene Labrum, MD [facility] [address] [place], [place] [address] [phone] (office)

## 2020-03-23 NOTE — Patient Instructions (Addendum)
Follow up with oncology and radiation oncology.  Will return to work from surgery standpoint 04/26/2020 (6 weeks+ after surgery). Oncology/ radiation oncology can extend time off pending their treatments/ needs.  Hold birth control for now. Discuss with Dr. Delton Coombes

## 2020-03-24 ENCOUNTER — Telehealth: Payer: Self-pay | Admitting: Family Medicine

## 2020-03-24 NOTE — Telephone Encounter (Signed)
Received confirmation that faxed paperwork was received.

## 2020-03-24 NOTE — Telephone Encounter (Signed)
Forms filled out and faxed to [employer] along with medical records at [phone]

## 2020-03-25 ENCOUNTER — Encounter (HOSPITAL_COMMUNITY): Payer: Self-pay | Admitting: Hematology

## 2020-03-25 ENCOUNTER — Inpatient Hospital Stay (HOSPITAL_BASED_OUTPATIENT_CLINIC_OR_DEPARTMENT_OTHER): Payer: BC Managed Care – PPO | Admitting: Genetic Counselor

## 2020-03-25 ENCOUNTER — Inpatient Hospital Stay (HOSPITAL_COMMUNITY): Payer: BC Managed Care – PPO | Attending: Hematology | Admitting: Hematology

## 2020-03-25 ENCOUNTER — Encounter (HOSPITAL_COMMUNITY): Payer: Self-pay | Admitting: Genetic Counselor

## 2020-03-25 ENCOUNTER — Encounter (HOSPITAL_COMMUNITY): Payer: Self-pay | Admitting: *Deleted

## 2020-03-25 ENCOUNTER — Other Ambulatory Visit: Payer: Self-pay

## 2020-03-25 VITALS — BP 108/80 | HR 73 | Temp 98.5°F | Resp 18 | Ht 66.0 in | Wt 173.0 lb

## 2020-03-25 DIAGNOSIS — Z170 Estrogen receptor positive status [ER+]: Secondary | ICD-10-CM | POA: Diagnosis not present

## 2020-03-25 DIAGNOSIS — Z8042 Family history of malignant neoplasm of prostate: Secondary | ICD-10-CM

## 2020-03-25 DIAGNOSIS — Z803 Family history of malignant neoplasm of breast: Secondary | ICD-10-CM | POA: Diagnosis not present

## 2020-03-25 DIAGNOSIS — Z806 Family history of leukemia: Secondary | ICD-10-CM | POA: Insufficient documentation

## 2020-03-25 DIAGNOSIS — F17210 Nicotine dependence, cigarettes, uncomplicated: Secondary | ICD-10-CM

## 2020-03-25 DIAGNOSIS — Z793 Long term (current) use of hormonal contraceptives: Secondary | ICD-10-CM | POA: Diagnosis not present

## 2020-03-25 DIAGNOSIS — C50912 Malignant neoplasm of unspecified site of left female breast: Secondary | ICD-10-CM

## 2020-03-25 DIAGNOSIS — C50412 Malignant neoplasm of upper-outer quadrant of left female breast: Secondary | ICD-10-CM

## 2020-03-25 NOTE — Assessment & Plan Note (Signed)
  1.  Stage Ib (T2N0) left breast IDC: -Bilateral mammogram and ultrasound on 02/04/2020 showed 1.8 x 1.3 x 2.1 cm irregular hypoechoic mass at the 2 o'clock position 3 to 4 cm from the nipple. -Ultrasound-guided biopsy on 02/04/2020 showed invasive ductal carcinoma, grade 2, -Left lumpectomy after needle localization and SLNB on 03/08/2020. -Pathology consistent with 2.1 cm IDC, grade 1, 0/3 lymph nodes positive, PT2PN0, ER 95%, PR 70%, HER-2 not amplified, Ki-67 10%.  Invasive carcinoma is broadly less than 0.1 cm to the medial margin.  Reexcision margins were negative.  2.  Family history: -Breast cancer in her paternal grandmother.  Prostate cancer in her father.

## 2020-03-25 NOTE — Progress Notes (Signed)
I met with patient today before the visit with Dr. Delton Coombes.  I provided some written material for her on her disease process: breast cancer.  I provided information on the [employer] program. I provided her a book on nutrition.  I explained to her that she would get more information during the visit with Dr. Delton Coombes about her treatment plan and expected duration of treatment.  She is present with her mom today. They were given the opportunity to ask questions and all were answered to their satisfaction.  Her biggest concern is her job. She fears getting points for absenses  related to her treatment and losing her job.  I explained to her that if she will get [place] the proper paperwork for disability and FMLA we will be glad to fill them out for her.  She states that she has a paper today that needs to be filled out for loan insurance.  She will get [place] any additional paperwork from her job. She does state that they already have FMLA papers from her surgeon and we only need to update them once we know the treatment plan.  I advised her that we will take care of this.  We attempted to arrange genetic testing for her today but she does not want to pursue any genetic testing. She does agree to oncotype DX to test for her benefits from chemotherapy.  I explained that this will be done and we will go over the results at her next visit.  She verbalizes understanding.

## 2020-03-25 NOTE — Patient Instructions (Addendum)
[facility] Discharge Instructions  You were seen today by Dr. Delton Coombes. He discussed your medical history and how you have been doing since your lumpectomy.  He talked with you about your cancer, it is an early stage 1 breast cancer.  This means that we caught it early and it hasn't spread to any other parts of your body.  He talked with you about your birth control and the recent evidence about it causing breast cancer.  He wants you to stop taking the birth control.  Dr. Delton Coombes wants you to have genetic testing.  We will schedule you for this.  We will send your tumor that was removed during surgery off for additional testing.  This testing will show if you will benefit from chemotherapy.  It takes about 2-3 weeks for this test to come back.  We will review it with you at your next visit. Dr. Delton Coombes discussed with you that if your test shows that you don't need chemo, you will go on a pill that blocks the hormones that were feeding your tumor and we will also refer you to radiation.     Thank you for choosing [place] at [facility] to provide your oncology and hematology care.  To afford each patient quality time with our provider, please arrive at least 15 minutes before your scheduled appointment time.   If you have a lab appointment with the [facility] please come in thru the Main Entrance and check in at the main information desk.  You need to re-schedule your appointment should you arrive 10 or more minutes late.  We strive to give you quality time with our providers, and arriving late affects you and other patients whose appointments are after yours.  Also, if you no show three or more times for appointments you may be dismissed from the clinic at the providers discretion.     Again, thank you for choosing [facility].  Our hope is that these requests will decrease the amount of time that you wait before  being seen by our physicians.       _____________________________________________________________  Should you have questions after your visit to [facility], please contact our office at [phone] between the hours of 8:00 a.m. and 4:30 p.m.  Voicemails left after 4:00 p.m. will not be returned until the following business day.  For prescription refill requests, have your pharmacy contact our office and allow 72 hours.      Due to Covid, you will need to wear a mask upon entering the hospital. If you do not have a mask, a mask will be given to you at the Main Entrance upon arrival. For doctor visits, patients may have 1 support person with them. For treatment visits, patients can not have anyone with them due to social distancing guidelines and our immunocompromised population.

## 2020-03-25 NOTE — Progress Notes (Signed)
REFERRING PROVIDER: Derek Jack, MD [place],  [place] [address]  PRIMARY PROVIDER:  [name], [name], MD  PRIMARY REASON FOR VISIT:  1. Invasive ductal carcinoma of left breast ([place])   2. Family history of prostate cancer   3. Family history of breast cancer   4. Family history of leukemia      I connected with Doris Newman on 03/25/2020 at 10:00 am EDT by video conference and verified that I am speaking with the correct person using two identifiers.   Patient location: [facility] Provider location: [facility] office  HISTORY OF PRESENT ILLNESS:   Doris Newman, a 49 y.o. female, was seen for a [place] cancer genetics consultation at the request of Dr. Delton Coombes due to a personal and family history of cancer.  Doris Newman presents to clinic today to discuss the possibility of a hereditary predisposition to cancer, genetic testing, and to further clarify her future cancer risks, as well as potential cancer risks for family members.   In April 2021, at the age of 36, Doris Newman was diagnosed with invasive ductal carcinoma, ER+/PR+/Her2-, of the left breast. The treatment plan included surgery (lumpectomy completed 03/08/20), Oncotype DX, radiation therapy, and consideration of adjuvant endocrine therapy.   RISK FACTORS:  Menarche was at age 89.  First live birth at age 70.  OCP use for approximately 32 years.  Ovaries intact: yes.  Hysterectomy: no.  Menopausal status: premenopausal.  HRT use: 0 years. Colonoscopy: n/a. Mammogram within the last year: yes. Any excessive radiation exposure in the past: no   Past Medical History:  Diagnosis Date   Anxiety    Family history of adverse reaction to anesthesia    PONV   Family history of breast cancer    Family history of leukemia    Family history of prostate cancer    GERD (gastroesophageal reflux disease)    Hypertension     Past Surgical History:  Procedure Laterality Date     LEEP     PARTIAL MASTECTOMY WITH NEEDLE LOCALIZATION AND AXILLARY SENTINEL LYMPH NODE BX Left 03/08/2020   Procedure: PARTIAL MASTECTOMY WITH NEEDLE LOCALIZATION AND AXILLARY SENTINEL LYMPH NODE BX;  Surgeon: Virl Cagey, MD;  Location: [facility];  Service: General;  Laterality: Left;    Social History   Socioeconomic History   Marital status: Widowed    Spouse name: Not on file   Number of children: 1   Years of education: Not on file   Highest education level: Not on file  Occupational History   Not on file  Tobacco Use   Smoking status: Current Some Day Smoker    Packs/day: 0.50    Years: 25.00    Pack years: 12.50    Types: Cigarettes   Smokeless tobacco: Never Used  Vaping Use   Vaping Use: Never used  Substance and Sexual Activity   Alcohol use: Yes    Comment: occasional   Drug use: No   Sexual activity: Yes  Other Topics Concern   Not on file  Social History Narrative   Not on file   Social Determinants of Health   Financial Resource Strain: Low Risk    Difficulty of Paying Living Expenses: Not very hard  Food Insecurity: No Food Insecurity   Worried About Running Out of Food in the Last Year: Never true   Ran Out of Food in the Last Year: Never true  Transportation Needs: No Transportation Needs   Lack  of Transportation (Medical): No   Lack of Transportation (Non-Medical): No  Physical Activity: Insufficiently Active   Days of Exercise per Week: 2 days   Minutes of Exercise per Session: 20 min  Stress: Stress Concern Present   Feeling of Stress : To some extent  Social Connections: Moderately Integrated   Frequency of Communication with Friends and Family: More than three times a week   Frequency of Social Gatherings with Friends and Family: More than three times a week   Attends Religious Services: More than 4 times per year   Active Member of Clubs or Organizations: No   Attends Music therapist: More than  4 times per year   Marital Status: Widowed     FAMILY HISTORY:  We obtained a detailed, 4-generation family history.  Significant diagnoses are listed below: Family History  Problem Relation Age of Onset   Prostate cancer Father        dx. in his late 50s/early 79s   Breast cancer Paternal Grandmother        dx. in her 57s   Fibromyalgia Sister    Leukemia Paternal Grandfather        dx. in his 100s   Doris Newman has one son who is 61. She has one full sister (age 64) and one paternal half-brother (age 61). None of these family members have had cancer.   Doris Newman mother is 54 and has not had cancer. She has two maternal aunts and two maternal uncles. None of her maternal cousins have had cancer. Her maternal grandmother died at the age of 74 and her maternal grandfather died at the age of 4. There are no other known diagnoses of cancer on the maternal side of the family.  Doris Newman father is 76 and has a history of prostate cancer diagnosed in his late 97s or early 59s. Her father had no brothers or sister. Her paternal grandmother died at the age of 59 and had breast cancer diagnosed in her 83s, and her paternal grandfather died from leukemia in his 74s. There are no other known diagnoses of cancer on the paternal side of the family.  Doris Newman is unaware of previous family history of genetic testing for hereditary cancer risks. She does not know her ancestry. There is no reported Ashkenazi Jewish ancestry. There is no known consanguinity.  GENETIC COUNSELING ASSESSMENT: Doris Newman is a 49 y.o. female with a personal history of breast cancer and a family history of breast and prostate cancer, which is somewhat suggestive of a hereditary cancer syndrome and predisposition to cancer. We, therefore, discussed and recommended the following at today's visit.   DISCUSSION: We discussed that 5-10% of breast cancer is hereditary, with most cases associated with the BRCA1  and BRCA2 genes. There are other genes that can be associated with hereditary breast cancer syndromes. These include ATM, CHEK2, PALB2, etc. We discussed that testing is beneficial for several reasons, including knowing about other cancer risks, identifying potential screening and risk-reduction options that may be appropriate, and to understand if other family members could be at risk for cancer and allow them to undergo genetic testing.   We reviewed the characteristics, features and inheritance patterns of hereditary cancer syndromes. We also discussed genetic testing, including the appropriate family members to test, the process of testing, insurance coverage and turn-around-time for results. We discussed the implications of a negative vs a positive result. We recommended Doris Newman pursue genetic testing for a hereditary cancer  gene panel, if desired.   Based on Doris Newman's personal and family history of cancer, she meets medical criteria for genetic testing. Despite that she meets criteria, there may still be an out of pocket cost.   Doris Newman expressed that she would not want to know about a hereditary predisposition for cancer, and would therefore prefer to not have genetic testing at this time.  PLAN:  Doris Newman did not wish to pursue genetic testing at today's visit. We understand this decision and remain available to coordinate genetic testing at any time in the future. We, therefore, recommend Doris Newman continue to follow the cancer screening guidelines given by her primary healthcare provider.  Based on Doris Newman's family history, we recommended her father, who was diagnosed with prostate cancer in his late 50s/early 4s, have genetic counseling and testing. Doris Newman will let us know if we can be of any assistance in coordinating genetic counseling and/or testing for this family member.   Lastly, we encouraged Doris Newman to remain in contact with cancer  genetics so that we can continuously update the family history and inform her of any changes in cancer genetics and testing that may be of benefit for this family.   Doris Newman questions were answered to her satisfaction today. Our contact information was provided should additional questions or concerns arise. Thank you for the referral and allowing [place] to share in the care of your patient.   Clint Guy, [place], [facility] Licensed, Certified Dispensing [email or web address] Phone: [phone]  The patient was seen for a total of 20 minutes in face-to-face genetic counseling.  This patient was discussed with Drs. Magrinat, Lindi Adie and/or Burr Medico who agrees with the above.    _______________________________________________________________________ For Office Staff:  Number of people involved in session: 1 Was an Intern/ student involved with case: no

## 2020-03-25 NOTE — Progress Notes (Signed)
[facility] [address], [place] [address]   CLINIC:  Medical Oncology/Hematology  CONSULT NOTE  Patient Care Team: [name], [name], MD as PCP - General (Family Medicine) Donetta Potts, RN as Oncology Nurse Navigator (Oncology) Derek Jack, MD as Medical Oncologist (Oncology)  CHIEF COMPLAINTS/PURPOSE OF CONSULTATION:  Invasive ductal carcinoma of left breast  HISTORY OF PRESENTING ILLNESS:  [name] 49 y.o. female is here because of invasive ductal carcinoma of left breast, at the request of Dr. Curlene Labrum.  She was found to have a lump felt at the health department.  She underwent mammogram and ultrasound on 02/04/2020 at [facility].  This showed 1.8 x 1.3 x 2.1 cm irregular hypoechoic mass at the 2 o'clock position.  Ultrasound guided biopsy on 02/04/2020 showed invasive ductal carcinoma.  She was then evaluated by Dr. Constance Haw.  Today she comes in with her mother. She reports that her L breast is still bruised and painful after her partial mastectomy and axillary sentinel LN biopsy on 03/08/2020.  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 Paternal GM had breast cancer and father had prostate cancer. Menstruating at 49 years old and still menstruating every 28 days for 5-6 days. She has been on OCP since the age of 48 years old and 1 child born when she was 9 years old. She smokes 1/4 PPD since the age of 49 years old.  MEDICAL HISTORY:  Past Medical History:  Diagnosis Date  . Anxiety   . Family history of adverse reaction to anesthesia    PONV  . GERD (gastroesophageal reflux disease)   . Hypertension     SURGICAL HISTORY: Past Surgical History:  Procedure Laterality Date  . LEEP    . PARTIAL MASTECTOMY WITH NEEDLE LOCALIZATION AND AXILLARY SENTINEL LYMPH NODE BX Left 03/08/2020   Procedure: PARTIAL MASTECTOMY WITH NEEDLE LOCALIZATION AND AXILLARY SENTINEL LYMPH NODE BX;  Surgeon: Virl Cagey, MD;   Location: [facility];  Service: General;  Laterality: Left;    SOCIAL HISTORY: Social History   Socioeconomic History  . Marital status: Widowed    Spouse name: Not on file  . Number of children: 1  . Years of education: Not on file  . Highest education level: Not on file  Occupational History  . Not on file  Tobacco Use  . Smoking status: Current Some Day Smoker    Packs/day: 0.50    Years: 25.00    Pack years: 12.50    Types: Cigarettes  . Smokeless tobacco: Never Used  Vaping Use  . Vaping Use: Never used  Substance and Sexual Activity  . Alcohol use: Yes    Comment: occasional  . Drug use: No  . Sexual activity: Yes  Other Topics Concern  . Not on file  Social History Narrative  . Not on file   Social Determinants of Health   Financial Resource Strain: Low Risk   . Difficulty of Paying Living Expenses: Not very hard  Food Insecurity: No Food Insecurity  . Worried About Charity fundraiser in the Last Year: Never true  . Ran Out of Food in the Last Year: Never true  Transportation Needs: No Transportation Needs  . Lack of Transportation (Medical): No  . Lack of Transportation (Non-Medical): No  Physical Activity: Insufficiently Active  . Days of Exercise per Week: 2 days  . Minutes of Exercise per Session: 20 min  Stress: Stress Concern Present  .  Feeling of Stress : To some extent  Social Connections: Moderately Integrated  . Frequency of Communication with Friends and Family: More than three times a week  . Frequency of Social Gatherings with Friends and Family: More than three times a week  . Attends Religious Services: More than 4 times per year  . Active Member of Clubs or Organizations: No  . Attends Archivist Meetings: More than 4 times per year  . Marital Status: Widowed  Intimate Partner Violence: Not At Risk  . Fear of Current or Ex-Partner: No  . Emotionally Abused: No  . Physically Abused: No  . Sexually Abused: No    FAMILY  HISTORY: Family History  Problem Relation Age of Onset  . Prostate cancer Father   . Breast cancer Paternal Grandmother   . Fibromyalgia Sister     ALLERGIES:  has No Known Allergies.  MEDICATIONS:  Current Outpatient Medications  Medication Sig Dispense Refill  . ALPRAZolam (XANAX) 1 MG tablet Take 1 mg by mouth 4 (four) times daily.    . citalopram (CELEXA) 20 MG tablet Take 20 mg by mouth daily.    Marland Kitchen docusate sodium (COLACE) 100 MG capsule Take 1 capsule (100 mg total) by mouth 2 (two) times daily. 60 capsule 2  . enalapril (VASOTEC) 10 MG tablet Take 1 tablet (10 mg total) by mouth daily. 90 tablet 1  . hydrochlorothiazide (HYDRODIURIL) 25 MG tablet Take 1 tablet (25 mg total) by mouth daily. 90 tablet 0  . loratadine (CLARITIN) 10 MG tablet Take 10 mg by mouth daily.    . mirtazapine (REMERON) 15 MG tablet Take 15 mg by mouth at bedtime as needed (sleep).     . Multiple Vitamin (MULTIVITAMIN WITH MINERALS) TABS tablet Take 1 tablet by mouth daily.    Marland Kitchen omeprazole (PRILOSEC) 20 MG capsule Take 1 capsule (20 mg total) by mouth daily. 90 capsule 1  . propranolol (INDERAL) 20 MG tablet Take 20 mg by mouth daily.     . Aspirin-Acetaminophen-Caffeine (GOODY HEADACHE PO) Take 1 packet by mouth every 12 (twelve) hours as needed (headache).  (Patient not taking: Reported on 03/25/2020)    . naproxen sodium (ALEVE) 220 MG tablet Take 440 mg by mouth daily as needed (pain).  (Patient not taking: Reported on 03/25/2020)    . ondansetron (ZOFRAN) 4 MG tablet Take 1 tablet (4 mg total) by mouth every 8 (eight) hours as needed for nausea or vomiting. (Patient not taking: Reported on 03/25/2020) 30 tablet 1  . oxyCODONE (ROXICODONE) 5 MG immediate release tablet Take 1 tablet (5 mg total) by mouth every 4 (four) hours as needed for severe pain or breakthrough pain. (Patient not taking: Reported on 03/25/2020) 20 tablet 0   No current facility-administered medications for this visit.    REVIEW OF  SYSTEMS:   Review of Systems  Constitutional: Positive for appetite change (mildly decreased) and fatigue (moderate).  Cardiovascular: Positive for leg swelling (after standing on her feet).  Neurological: Negative for numbness.  All other systems reviewed and are negative.    PHYSICAL EXAMINATION: ECOG PERFORMANCE STATUS: 0 - Asymptomatic  Vitals:   03/25/20 0832  BP: 108/80  Pulse: 73  Resp: 18  Temp: 98.5 F (36.9 C)  SpO2: 100%   Filed Weights   03/25/20 0832  Weight: 173 lb (78.5 kg)   Physical Exam Vitals reviewed.  Constitutional:      Appearance: Normal appearance.  Cardiovascular:     Rate and Rhythm: Normal  rate and regular rhythm.     Pulses: Normal pulses.     Heart sounds: Normal heart sounds.  Pulmonary:     Effort: Pulmonary effort is normal.     Breath sounds: Normal breath sounds.  Chest:    Abdominal:     Palpations: Abdomen is soft. There is no mass.     Tenderness: There is no abdominal tenderness.  Lymphadenopathy:     Upper Body:     Right upper body: No axillary or pectoral adenopathy.     Left upper body: No axillary or pectoral adenopathy.  Neurological:     General: No focal deficit present.     Mental Status: She is alert and oriented to person, place, and time.  Psychiatric:        Mood and Affect: Mood normal.        Behavior: Behavior normal.      LABORATORY DATA:  I have reviewed the data as listed CBC Latest Ref Rng & Units 11/27/2019 05/01/2017 08/14/2016  WBC 3.4 - 10.8 x10E3/uL 6.6 6.2 6.3  Hemoglobin 11.1 - 15.9 g/dL 13.0 14.0 14.7  Hematocrit 34.0 - 46.6 % 38.2 42.4 41.7  Platelets 150 - 450 x10E3/uL 324 367 370   CMP Latest Ref Rng & Units 03/05/2020 11/27/2019 05/01/2017  Glucose 70 - 99 mg/dL 89 80 50(L)  BUN 6 - 20 mg/dL '8 14 7  ' Creatinine 0.44 - 1.00 mg/dL 0.78 1.02(H) 0.89  Sodium 135 - 145 mmol/L 128(L) 138 138  Potassium 3.5 - 5.1 mmol/L 3.4(L) 4.1 4.4  Chloride 98 - 111 mmol/L 89(L) 103 99  CO2 22 - 32  mmol/L 25 22 15(L)  Calcium 8.9 - 10.3 mg/dL 8.8(L) 9.4 9.1  Total Protein 6.0 - 8.5 g/dL - 6.4 6.8  Total Bilirubin 0.0 - 1.2 mg/dL - 0.4 0.3  Alkaline Phos 39 - 117 IU/L - 69 63  AST 0 - 40 IU/L - 20 34  ALT 0 - 32 IU/L - 23 33(H)    RADIOGRAPHIC STUDIES: I have personally reviewed the radiological images as listed and agreed with the findings in the report. NM Sentinel Node Inj-No Rpt (Breast)  Result Date: 03/08/2020 Sulfur colloid was injected by the nuclear medicine technologist for melanoma sentinel node.   MM BREAST SURGICAL SPECIMEN  Result Date: 03/08/2020 CLINICAL DATA:  LEFT breast cancer EXAM: SPECIMEN RADIOGRAPH OF THE LEFT BREAST COMPARISON:  Preoperative needle localization images of 03/08/2020 FINDINGS: Status post excision of the LEFT breast. The wire tip and biopsy marker clip are present and are marked for pathology. IMPRESSION: Specimen radiograph of the LEFT breast. Electronically Signed   By: Lavonia Dana M.D.   On: 03/08/2020 13:37   MM LT PLC BREAST LOC DEV   1ST LESION  INC MAMMO GUIDE  Result Date: 03/08/2020 CLINICAL DATA:  LEFT breast cancer, for lump ectomy EXAM: NEEDLE LOCALIZATION OF THE LEFT BREAST WITH MAMMO GUIDANCE COMPARISON:  02/04/2020 PROCEDURE: Patient presents for needle localization prior to lumpectomy. I met with the patient and we discussed the procedure of needle localization including benefits and alternatives. We discussed the high likelihood of a successful procedure. We discussed the risks of the procedure, including infection, bleeding, tissue injury, and further surgery. Informed, written consent was given. The usual time-out protocol was performed immediately prior to the procedure. Using mammographic guidance, sterile technique, 1% lidocaine and a 7 cm modified Kopans needle, ribbon shaped surgical clip at the outer mid LEFT breast localized using anterior free hand approach.  The images were marked for Dr. Constance Haw. IMPRESSION: Needle  localization LEFT breast. No apparent complications. Electronically Signed   By: Lavonia Dana M.D.   On: 03/08/2020 10:10    ASSESSMENT:  1.  Stage Ib (T2N0) left breast IDC: -Right breast lump was palpated at health department.  Bilateral mammogram and ultrasound on 02/04/2020 at [facility] showed 1.8 x 1.3 x 2.1 cm irregular hypoechoic mass at the 2 o'clock position 3 to 4 cm from nipple. -Ultrasound-guided biopsy on 02/04/2020 showed invasive ductal carcinoma, grade 2. -Left lumpectomy after needle localization and SLNB by Dr. Constance Haw on 03/08/2020. -Pathology-2.1 cm IDC, grade 1, 0/3 lymph nodes positive, PT 2 PN 0, ER 95%, PR to 70%, HER-2 negative, Ki-67 10%.  Invasive carcinoma is broadly less than 0.1 cm to the medial margin.  Reexcision margins were negative.  2.  Family history: -Paternal grandmother had breast cancer. -Father had prostate cancer.    PLAN:  1.  Stage Ib left breast infiltrating ductal carcinoma: -We have reviewed the pathology report in detail. -I have recommended Oncotype DX.  We will review the results in 2 to 3 weeks. -If she does not require chemotherapy, she will proceed with radiation therapy. -We will consider adjuvant endocrine therapy with ovarian suppression/ablation. -If she needs chemotherapy, 4 cycles of TC will be offered. -We will also refer for radiation therapy.  2.  Family history: -Because of her young age and family history, I have recommended genetic testing.   All questions were answered. The patient knows to call the clinic with any problems, questions or concerns.   Derek Jack, MD, 03/25/20 9:06 AM  [place] [phone]   I, Milinda Antis, am acting as a scribe for Dr. Sanda Linger.  I, Derek Jack MD, have reviewed the above documentation for accuracy and completeness, and I agree with the above.

## 2020-03-26 ENCOUNTER — Encounter (HOSPITAL_COMMUNITY): Payer: Self-pay | Admitting: *Deleted

## 2020-03-26 NOTE — Progress Notes (Signed)
Oncotype DX ordered on accession # [id number]

## 2020-04-06 DIAGNOSIS — Z170 Estrogen receptor positive status [ER+]: Secondary | ICD-10-CM | POA: Diagnosis not present

## 2020-04-06 DIAGNOSIS — C50912 Malignant neoplasm of unspecified site of left female breast: Secondary | ICD-10-CM | POA: Diagnosis not present

## 2020-04-07 DIAGNOSIS — Z6827 Body mass index (BMI) 27.0-27.9, adult: Secondary | ICD-10-CM | POA: Diagnosis not present

## 2020-04-07 DIAGNOSIS — C50112 Malignant neoplasm of central portion of left female breast: Secondary | ICD-10-CM | POA: Diagnosis not present

## 2020-04-07 DIAGNOSIS — Z170 Estrogen receptor positive status [ER+]: Secondary | ICD-10-CM | POA: Diagnosis not present

## 2020-04-16 ENCOUNTER — Encounter (HOSPITAL_COMMUNITY): Payer: Self-pay | Admitting: Oncology

## 2020-04-16 ENCOUNTER — Inpatient Hospital Stay (HOSPITAL_COMMUNITY): Payer: BC Managed Care – PPO | Attending: Hematology | Admitting: Oncology

## 2020-04-16 VITALS — BP 115/81 | HR 82 | Temp 97.7°F | Resp 18 | Wt 170.0 lb

## 2020-04-16 DIAGNOSIS — K219 Gastro-esophageal reflux disease without esophagitis: Secondary | ICD-10-CM | POA: Diagnosis not present

## 2020-04-16 DIAGNOSIS — C50412 Malignant neoplasm of upper-outer quadrant of left female breast: Secondary | ICD-10-CM | POA: Insufficient documentation

## 2020-04-16 DIAGNOSIS — Z803 Family history of malignant neoplasm of breast: Secondary | ICD-10-CM | POA: Diagnosis not present

## 2020-04-16 DIAGNOSIS — Z8041 Family history of malignant neoplasm of ovary: Secondary | ICD-10-CM | POA: Diagnosis not present

## 2020-04-16 DIAGNOSIS — Z806 Family history of leukemia: Secondary | ICD-10-CM | POA: Insufficient documentation

## 2020-04-16 DIAGNOSIS — I10 Essential (primary) hypertension: Secondary | ICD-10-CM | POA: Diagnosis not present

## 2020-04-16 DIAGNOSIS — C50912 Malignant neoplasm of unspecified site of left female breast: Secondary | ICD-10-CM

## 2020-04-16 DIAGNOSIS — F419 Anxiety disorder, unspecified: Secondary | ICD-10-CM | POA: Diagnosis not present

## 2020-04-16 DIAGNOSIS — Z170 Estrogen receptor positive status [ER+]: Secondary | ICD-10-CM | POA: Insufficient documentation

## 2020-04-16 NOTE — Progress Notes (Signed)
Doris Newman, Doris Kaufmann, [place] [id number]  Invasive ductal carcinoma of left breast [facility])  Essential hypertension   HISTORY OF PRESENT ILLNESS: Stage Ib (T2N0) left breast IDC: -Right breast lump was palpated at health department.  Bilateral mammogram and ultrasound on 02/04/2020 at [facility] showed 1.8 x 1.3 x 2.1 cm irregular hypoechoic mass at the 2 o'clock position 3 to 4 cm from nipple. -Ultrasound-guided biopsy on 02/04/2020 showed invasive ductal carcinoma, grade 2. -Left lumpectomy after needle localization and SLNB by Dr. Constance Haw on 03/08/2020. -Pathology-2.1 cm IDC, grade 1, 0/3 lymph nodes positive, PT 2 PN 0, ER 95%, PR to 70%, HER-2 negative, Ki-67 10%.  Invasive carcinoma is broadly less than 0.1 cm to the medial margin.  Reexcision margins were negative. - OncotypeDx score was 16 with a 1.6% benefit from chemotherapy in her age group (< 64 yo) - She refused adjuvant systemic chemotherapy (04/16/2020)  CURRENT STATUS: Doris Newman 49 y.o. female returns for followup of breast cancer, newly diagnosed.  She had Oncotype DX score completed recently revealing a score of 16 with a 1.6% benefit from chemotherapy.  She is here today to learn the results and discuss medical oncology plan forward.  She denies any new lumps or bumps on her examination.  No new pain.  Appetite is good and weight stable.  She denies any new cough or hemoptysis.,  No abdominal bloating or discomfort.  She denies any unintentional weight loss.  She denies any new neurological deficits including headaches, dizziness, double vision, LOC, seizure.  Review of Systems  Constitutional: Negative.  Negative for chills, fever and weight loss.  HENT: Negative.   Eyes: Negative.   Respiratory: Negative.  Negative for cough.   Cardiovascular: Negative.  Negative for chest pain.  Gastrointestinal: Negative.  Negative for blood in stool, constipation, diarrhea, melena, nausea and vomiting.   Genitourinary: Negative.   Musculoskeletal: Negative.   Skin: Negative.   Neurological: Negative.  Negative for weakness.  Endo/Heme/Allergies: Negative.   Psychiatric/Behavioral: Negative.     Past Medical History:  Diagnosis Date  . Anxiety   . Family history of adverse reaction to anesthesia    PONV  . Family history of breast cancer   . Family history of leukemia   . Family history of prostate cancer   . GERD (gastroesophageal reflux disease)   . Hypertension      PHYSICAL EXAMINATION  ECOG PERFORMANCE STATUS: 0 - Asymptomatic  Vitals:   04/16/20 1114  BP: 115/81  Pulse: 82  Resp: 18  Temp: 97.7 F (36.5 C)  SpO2: 98%    GENERAL:alert, no distress, well nourished, well developed, comfortable, cooperative, smiling and accompanied with friend SKIN: skin color, texture, turgor are normal, no rashes or significant lesions HEAD: Normocephalic, No masses, lesions, tenderness or abnormalities EYES: normal, Conjunctiva are pink and non-injected EARS: External ears normal OROPHARYNX: Not examined, mask in place NECK: supple, no adenopathy LYMPH:  no palpable lymphadenopathy BREAST:not examined LUNGS: clear to auscultation  HEART: regular rate & rhythm ABDOMEN:abdomen soft and normal bowel sounds BACK: Back symmetric, no curvature. EXTREMITIES:less then 2 second capillary refill, no joint deformities, effusion, or inflammation, no skin discoloration, no cyanosis  NEURO: alert & oriented x 3 with fluent speech, no focal motor/sensory deficits, gait normal   LABORATORY DATA: CBC    Component Value Date/Time   WBC 6.6 11/27/2019 1223   WBC 5.8 08/27/2014 1254   RBC 4.21 11/27/2019 1223  RBC 4.6 08/27/2014 1254   HGB 13.0 11/27/2019 1223   HCT 38.2 11/27/2019 1223   PLT 324 11/27/2019 1223   MCV 91 11/27/2019 1223   MCH 30.9 11/27/2019 1223   MCH 30.5 08/27/2014 1254   MCHC 34.0 11/27/2019 1223   MCHC 32.2 08/27/2014 1254   RDW 13.1 11/27/2019 1223    LYMPHSABS 2.6 11/27/2019 1223   EOSABS 0.2 11/27/2019 1223   BASOSABS 0.0 11/27/2019 1223      Chemistry      Component Value Date/Time   NA 128 (L) 03/05/2020 1027   NA 138 11/27/2019 1223   K 3.4 (L) 03/05/2020 1027   CL 89 (L) 03/05/2020 1027   CO2 25 03/05/2020 1027   BUN 8 03/05/2020 1027   BUN 14 11/27/2019 1223   CREATININE 0.78 03/05/2020 1027      Component Value Date/Time   CALCIUM 8.8 (L) 03/05/2020 1027   ALKPHOS 69 11/27/2019 1223   AST 20 11/27/2019 1223   ALT 23 11/27/2019 1223   BILITOT 0.4 11/27/2019 1223       RADIOGRAPHIC STUDIES:  No results found.     ASSESSMENT AND PLAN:  1. Invasive ductal carcinoma of left breast (HCC) Stage Ib (T2N0) left breast IDC: -Right breast lump was palpated at health department.  Bilateral mammogram and ultrasound on 02/04/2020 at [facility] showed 1.8 x 1.3 x 2.1 cm irregular hypoechoic mass at the 2 o'clock position 3 to 4 cm from nipple. -Ultrasound-guided biopsy on 02/04/2020 showed invasive ductal carcinoma, grade 2. -Left lumpectomy after needle localization and SLNB by Dr. Constance Haw on 03/08/2020. -Pathology-2.1 cm IDC, grade 1, 0/3 lymph nodes positive, PT 2 PN 0, ER 95%, PR to 70%, HER-2 negative, Ki-67 10%.  Invasive carcinoma is broadly less than 0.1 cm to the medial margin.  Reexcision margins were negative. - OncotypeDx score was 16 with a 1.6% benefit from chemotherapy in her age group (< 83 yo)  She is here today to review OncotypeDx score and learn medical oncology recommendations. Her OncotypeDx score is 16 with a 1.6% chemotherapy benefit.  She has declined chemotherapy and despite my best efforts to have her re-consider, she declined.  I reviewed TC chemotherapy, side effects, and benefits.    She has decided to pursue adjuvant XRT followed by 5 years of anti-endocrine therapy.    We will refer the patient back to radiation oncology for simulation followed by ~ 6 weeks of XRT.  We will see her back in about 5  weeks.  At that time, she will be ~ 1/2 through with XRT and we can start discussing anti-endocrine therapy options.  2. Essential hypertension 115/81- well controlled.   ORDERS PLACED FOR THIS ENCOUNTER: No orders of the defined types were placed in this encounter.   MEDICATIONS PRESCRIBED THIS ENCOUNTER: No orders of the defined types were placed in this encounter.   All questions were answered. The patient knows to call the clinic with any problems, questions or concerns. We can certainly see the patient much sooner if necessary.  Patient and plan discussed with Dr. Derek Jack and he is in agreement with the aforementioned.   This note is electronically signed by: Robynn Pane, PA-C 04/16/2020 2:42 PM

## 2020-04-16 NOTE — Patient Instructions (Signed)
[place] at [facility] Discharge Instructions  You were seen today by Kirby Crigler, PA.  He is here today because Dr.Katragadda is out on vacation.  He talked with you about your specialized testing score.  Your oncotype DX showed a score of 16.  This score showed a 1.6% benefit of receiving chemotherapy.  This means that we recommend for you to receive chemotherapy but you can choose to receive it or not.    He talked with you about what happens if you should choose chemotherapy.  Hair loss, some nausea vomiting, peripheral neuropathy, decrease in blood counts, maybe some liver function changes.  These side effects go away once you complete the treatment maybe about 6 weeks to 3 months after chemotherapy.  If you do choose to get chemo it will be 1 day every 3 weeks for 4 times.    Our intention of any treatments: chemotherapy followed by radiation or radiation alone are done to prevent the cancer from coming back. At this time you are already cancer free, following your surgery.    If you choose to just do radiation and not chemotherapy, you will still need to take a pill every day for at least 5 years.  Your cancer is fed by the normal hormones in your body and so the pill is to prevent excessive hormones and prevent the cancer from returning.    We will update the radiation team about your decision and they will contact you about your appointments.    We will see you back here about half way through your radiation treatments, in about 5 weeks,  to discuss how you are doing and the upcoming pill that you will start to take.       Thank you for choosing [place] at [facility] to provide your oncology and hematology care.  To afford each patient quality time with our provider, please arrive at least 15 minutes before your scheduled appointment time.   If you have a lab appointment with the [place] please come in thru the Main Entrance and  check in at the main information desk.  You need to re-schedule your appointment should you arrive 10 or more minutes late.  We strive to give you quality time with our providers, and arriving late affects you and other patients whose appointments are after yours.  Also, if you no show three or more times for appointments you may be dismissed from the clinic at the providers discretion.     Again, thank you for choosing [facility].  Our hope is that these requests will decrease the amount of time that you wait before being seen by our physicians.       _____________________________________________________________  Should you have questions after your visit to [facility], please contact our office at [phone] between the hours of 8:00 a.m. and 4:30 p.m.  Voicemails left after 4:00 p.m. will not be returned until the following business day.  For prescription refill requests, have your pharmacy contact our office and allow 72 hours.    Due to Covid, you will need to wear a mask upon entering the hospital. If you do not have a mask, a mask will be given to you at the Main Entrance upon arrival. For doctor visits, patients may have 1 support person with them. For treatment visits, patients can not have anyone with them due to social distancing guidelines and our immunocompromised  population.

## 2020-04-20 DIAGNOSIS — C50112 Malignant neoplasm of central portion of left female breast: Secondary | ICD-10-CM | POA: Diagnosis not present

## 2020-04-20 DIAGNOSIS — Z170 Estrogen receptor positive status [ER+]: Secondary | ICD-10-CM | POA: Diagnosis not present

## 2020-04-22 ENCOUNTER — Other Ambulatory Visit: Payer: Self-pay | Admitting: Family Medicine

## 2020-04-22 DIAGNOSIS — I10 Essential (primary) hypertension: Secondary | ICD-10-CM

## 2020-04-22 DIAGNOSIS — R609 Edema, unspecified: Secondary | ICD-10-CM

## 2020-04-22 DIAGNOSIS — I83813 Varicose veins of bilateral lower extremities with pain: Secondary | ICD-10-CM

## 2020-04-23 NOTE — Telephone Encounter (Signed)
Last OV 11/27/19. Last RF 30 day supply given today. Next OV not scheduled  ntbs for further refills

## 2020-04-26 DIAGNOSIS — Z170 Estrogen receptor positive status [ER+]: Secondary | ICD-10-CM | POA: Diagnosis not present

## 2020-04-26 DIAGNOSIS — C50112 Malignant neoplasm of central portion of left female breast: Secondary | ICD-10-CM | POA: Diagnosis not present

## 2020-04-26 DIAGNOSIS — C50412 Malignant neoplasm of upper-outer quadrant of left female breast: Secondary | ICD-10-CM | POA: Diagnosis not present

## 2020-04-29 DIAGNOSIS — C50412 Malignant neoplasm of upper-outer quadrant of left female breast: Secondary | ICD-10-CM | POA: Diagnosis not present

## 2020-04-29 DIAGNOSIS — Z170 Estrogen receptor positive status [ER+]: Secondary | ICD-10-CM | POA: Diagnosis not present

## 2020-04-29 DIAGNOSIS — C50112 Malignant neoplasm of central portion of left female breast: Secondary | ICD-10-CM | POA: Diagnosis not present

## 2020-05-04 DIAGNOSIS — Z170 Estrogen receptor positive status [ER+]: Secondary | ICD-10-CM | POA: Diagnosis not present

## 2020-05-04 DIAGNOSIS — C50412 Malignant neoplasm of upper-outer quadrant of left female breast: Secondary | ICD-10-CM | POA: Diagnosis not present

## 2020-05-04 DIAGNOSIS — C50112 Malignant neoplasm of central portion of left female breast: Secondary | ICD-10-CM | POA: Diagnosis not present

## 2020-05-05 DIAGNOSIS — C50112 Malignant neoplasm of central portion of left female breast: Secondary | ICD-10-CM | POA: Diagnosis not present

## 2020-05-05 DIAGNOSIS — Z170 Estrogen receptor positive status [ER+]: Secondary | ICD-10-CM | POA: Diagnosis not present

## 2020-05-05 DIAGNOSIS — C50412 Malignant neoplasm of upper-outer quadrant of left female breast: Secondary | ICD-10-CM | POA: Diagnosis not present

## 2020-05-06 DIAGNOSIS — Z170 Estrogen receptor positive status [ER+]: Secondary | ICD-10-CM | POA: Diagnosis not present

## 2020-05-06 DIAGNOSIS — C50112 Malignant neoplasm of central portion of left female breast: Secondary | ICD-10-CM | POA: Diagnosis not present

## 2020-05-07 DIAGNOSIS — C50112 Malignant neoplasm of central portion of left female breast: Secondary | ICD-10-CM | POA: Diagnosis not present

## 2020-05-07 DIAGNOSIS — Z170 Estrogen receptor positive status [ER+]: Secondary | ICD-10-CM | POA: Diagnosis not present

## 2020-05-10 DIAGNOSIS — Z170 Estrogen receptor positive status [ER+]: Secondary | ICD-10-CM | POA: Diagnosis not present

## 2020-05-10 DIAGNOSIS — C50112 Malignant neoplasm of central portion of left female breast: Secondary | ICD-10-CM | POA: Diagnosis not present

## 2020-05-11 DIAGNOSIS — C50112 Malignant neoplasm of central portion of left female breast: Secondary | ICD-10-CM | POA: Diagnosis not present

## 2020-05-11 DIAGNOSIS — Z170 Estrogen receptor positive status [ER+]: Secondary | ICD-10-CM | POA: Diagnosis not present

## 2020-05-12 DIAGNOSIS — C50112 Malignant neoplasm of central portion of left female breast: Secondary | ICD-10-CM | POA: Diagnosis not present

## 2020-05-12 DIAGNOSIS — Z170 Estrogen receptor positive status [ER+]: Secondary | ICD-10-CM | POA: Diagnosis not present

## 2020-05-12 DIAGNOSIS — C50412 Malignant neoplasm of upper-outer quadrant of left female breast: Secondary | ICD-10-CM | POA: Diagnosis not present

## 2020-05-13 DIAGNOSIS — Z170 Estrogen receptor positive status [ER+]: Secondary | ICD-10-CM | POA: Diagnosis not present

## 2020-05-13 DIAGNOSIS — C50112 Malignant neoplasm of central portion of left female breast: Secondary | ICD-10-CM | POA: Diagnosis not present

## 2020-05-14 DIAGNOSIS — Z170 Estrogen receptor positive status [ER+]: Secondary | ICD-10-CM | POA: Diagnosis not present

## 2020-05-14 DIAGNOSIS — C50112 Malignant neoplasm of central portion of left female breast: Secondary | ICD-10-CM | POA: Diagnosis not present

## 2020-05-17 ENCOUNTER — Other Ambulatory Visit: Payer: Self-pay | Admitting: Family Medicine

## 2020-05-17 DIAGNOSIS — Z170 Estrogen receptor positive status [ER+]: Secondary | ICD-10-CM | POA: Diagnosis not present

## 2020-05-17 DIAGNOSIS — C50112 Malignant neoplasm of central portion of left female breast: Secondary | ICD-10-CM | POA: Diagnosis not present

## 2020-05-18 DIAGNOSIS — Z170 Estrogen receptor positive status [ER+]: Secondary | ICD-10-CM | POA: Diagnosis not present

## 2020-05-18 DIAGNOSIS — C50112 Malignant neoplasm of central portion of left female breast: Secondary | ICD-10-CM | POA: Diagnosis not present

## 2020-05-19 DIAGNOSIS — C50412 Malignant neoplasm of upper-outer quadrant of left female breast: Secondary | ICD-10-CM | POA: Diagnosis not present

## 2020-05-19 DIAGNOSIS — C50112 Malignant neoplasm of central portion of left female breast: Secondary | ICD-10-CM | POA: Diagnosis not present

## 2020-05-19 DIAGNOSIS — Z170 Estrogen receptor positive status [ER+]: Secondary | ICD-10-CM | POA: Diagnosis not present

## 2020-05-20 DIAGNOSIS — C50412 Malignant neoplasm of upper-outer quadrant of left female breast: Secondary | ICD-10-CM | POA: Diagnosis not present

## 2020-05-20 DIAGNOSIS — C50112 Malignant neoplasm of central portion of left female breast: Secondary | ICD-10-CM | POA: Diagnosis not present

## 2020-05-20 DIAGNOSIS — Z170 Estrogen receptor positive status [ER+]: Secondary | ICD-10-CM | POA: Diagnosis not present

## 2020-05-21 DIAGNOSIS — Z170 Estrogen receptor positive status [ER+]: Secondary | ICD-10-CM | POA: Diagnosis not present

## 2020-05-21 DIAGNOSIS — C50112 Malignant neoplasm of central portion of left female breast: Secondary | ICD-10-CM | POA: Diagnosis not present

## 2020-05-24 ENCOUNTER — Inpatient Hospital Stay (HOSPITAL_COMMUNITY): Payer: BC Managed Care – PPO | Attending: Hematology | Admitting: Hematology

## 2020-05-24 ENCOUNTER — Other Ambulatory Visit (HOSPITAL_COMMUNITY): Payer: Medicaid Other

## 2020-05-24 ENCOUNTER — Other Ambulatory Visit: Payer: Self-pay

## 2020-05-24 VITALS — BP 96/66 | HR 74 | Temp 97.1°F | Resp 18 | Wt 173.9 lb

## 2020-05-24 DIAGNOSIS — Z5111 Encounter for antineoplastic chemotherapy: Secondary | ICD-10-CM | POA: Diagnosis not present

## 2020-05-24 DIAGNOSIS — C50912 Malignant neoplasm of unspecified site of left female breast: Secondary | ICD-10-CM | POA: Diagnosis not present

## 2020-05-24 DIAGNOSIS — C50112 Malignant neoplasm of central portion of left female breast: Secondary | ICD-10-CM | POA: Diagnosis not present

## 2020-05-24 DIAGNOSIS — Z170 Estrogen receptor positive status [ER+]: Secondary | ICD-10-CM | POA: Insufficient documentation

## 2020-05-24 DIAGNOSIS — F17210 Nicotine dependence, cigarettes, uncomplicated: Secondary | ICD-10-CM | POA: Diagnosis not present

## 2020-05-24 DIAGNOSIS — Z806 Family history of leukemia: Secondary | ICD-10-CM | POA: Diagnosis not present

## 2020-05-24 DIAGNOSIS — Z803 Family history of malignant neoplasm of breast: Secondary | ICD-10-CM | POA: Insufficient documentation

## 2020-05-24 DIAGNOSIS — C50412 Malignant neoplasm of upper-outer quadrant of left female breast: Secondary | ICD-10-CM | POA: Insufficient documentation

## 2020-05-24 MED ORDER — ANASTROZOLE 1 MG PO TABS: 1.0000 mg | ORAL_TABLET | Freq: Every day | ORAL | 3 refills | Status: DC

## 2020-05-24 NOTE — Patient Instructions (Signed)
[place] at [facility] Discharge Instructions  You were seen today by Dr. Delton Coombes. He went over your recent results. You will be prescribed anastrozole to take daily and Lupron injections every 6 months. Buy calcium and vitamin D supplements over the counter and take daily. Dr. Delton Coombes will see you back in 3 months for labs and follow up.   Thank you for choosing [place] at [facility] to provide your oncology and hematology care.  To afford each patient quality time with our provider, please arrive at least 15 minutes before your scheduled appointment time.   If you have a lab appointment with the [place] please come in thru the Main Entrance and check in at the main information desk  You need to re-schedule your appointment should you arrive 10 or more minutes late.  We strive to give you quality time with our providers, and arriving late affects you and other patients whose appointments are after yours.  Also, if you no show three or more times for appointments you may be dismissed from the clinic at the providers discretion.     Again, thank you for choosing [facility].  Our hope is that these requests will decrease the amount of time that you wait before being seen by our physicians.       _____________________________________________________________  Should you have questions after your visit to [facility], please contact our office at [phone] between the hours of 8:00 a.m. and 4:30 p.m.  Voicemails left after 4:00 p.m. will not be returned until the following business day.  For prescription refill requests, have your pharmacy contact our office and allow 72 hours.    Cancer Center Support Programs:   > Cancer Support Group  2nd Tuesday of the month 1pm-2pm, Journey Room

## 2020-05-24 NOTE — Progress Notes (Signed)
[place], [place] [address]   CLINIC:  Medical Oncology/Hematology  PCP:  Newman, Doris Kaufmann, MD [place] / [place] [address] [phone]   REASON FOR VISIT:  Follow-up for invasive ductal carcinoma of left breast  PRIOR THERAPY: None  NGS Results: Oncotype Dx Recurrence Score 16  CURRENT THERAPY: Radiation therapy  BRIEF ONCOLOGIC HISTORY:  Oncology History  Invasive ductal carcinoma of left breast ([facility])  02/19/2020 Initial Diagnosis   Invasive ductal carcinoma of left breast ([facility])   04/06/2020 Genetic Testing   Oncotype DX Breast Recurrence Score Report        CANCER STAGING: Cancer Staging Invasive ductal carcinoma of left breast ([facility]) Staging form: Breast, AJCC 8th Edition - Clinical stage from 03/25/2020: Stage IB (cT2, cN0(sn), cM0, G1, ER+, PR+, HER2-) - Unsigned   INTERVAL HISTORY:  [name], a 49 y.o. female, returns for routine follow-up of her invasive ductal carcinoma of left breast. [name] was last seen on 03/25/2020.  Today she is accompanied by her mother. She reports left breast soreness due to her radiation treatments; she started her radiation on 7/20 and she has a total of 21 treatments. She did not meet with the geneticist since she does not want any more worrying. She reports multiple areas of rash and burning around her left breast and left armpit due to the radiation for which she applies a skin cream.  She is having her menses every 28 days. She will try to go back to work in October.   REVIEW OF SYSTEMS:  Review of Systems  Constitutional: Positive for appetite change (severely decreased) and fatigue (severe).  Cardiovascular: Positive for chest pain (L breast soreness). Negative for leg swelling.  Skin: Positive for rash (around L breast and armpit d/t radiation).  Neurological: Positive for headaches.  All other systems reviewed and are negative.   PAST MEDICAL/SURGICAL  HISTORY:  Past Medical History:  Diagnosis Date  . Anxiety   . Family history of adverse reaction to anesthesia    PONV  . Family history of breast cancer   . Family history of leukemia   . Family history of prostate cancer   . GERD (gastroesophageal reflux disease)   . Hypertension    Past Surgical History:  Procedure Laterality Date  . LEEP    . PARTIAL MASTECTOMY WITH NEEDLE LOCALIZATION AND AXILLARY SENTINEL LYMPH NODE BX Left 03/08/2020   Procedure: PARTIAL MASTECTOMY WITH NEEDLE LOCALIZATION AND AXILLARY SENTINEL LYMPH NODE BX;  Surgeon: Virl Cagey, MD;  Location: [facility];  Service: General;  Laterality: Left;    SOCIAL HISTORY:  Social History   Socioeconomic History  . Marital status: Widowed    Spouse name: Not on file  . Number of children: 1  . Years of education: Not on file  . Highest education level: Not on file  Occupational History  . Not on file  Tobacco Use  . Smoking status: Current Some Day Smoker    Packs/day: 0.50    Years: 25.00    Pack years: 12.50    Types: Cigarettes  . Smokeless tobacco: Never Used  Vaping Use  . Vaping Use: Never used  Substance and Sexual Activity  . Alcohol use: Yes    Comment: occasional  . Drug use: No  . Sexual activity: Yes  Other Topics Concern  . Not on file  Social History Narrative  . Not on file   Social Determinants of Health  Financial Resource Strain: Low Risk   . Difficulty of Paying Living Expenses: Not very hard  Food Insecurity: No Food Insecurity  . Worried About Charity fundraiser in the Last Year: Never true  . Ran Out of Food in the Last Year: Never true  Transportation Needs: No Transportation Needs  . Lack of Transportation (Medical): No  . Lack of Transportation (Non-Medical): No  Physical Activity: Insufficiently Active  . Days of Exercise per Week: 2 days  . Minutes of Exercise per Session: 20 min  Stress: Stress Concern Present  . Feeling of Stress : To some extent  Social  Connections: Moderately Integrated  . Frequency of Communication with Friends and Family: More than three times a week  . Frequency of Social Gatherings with Friends and Family: More than three times a week  . Attends Religious Services: More than 4 times per year  . Active Member of Clubs or Organizations: No  . Attends Archivist Meetings: More than 4 times per year  . Marital Status: Widowed  Intimate Partner Violence: Not At Risk  . Fear of Current or Ex-Partner: No  . Emotionally Abused: No  . Physically Abused: No  . Sexually Abused: No    FAMILY HISTORY:  Family History  Problem Relation Age of Onset  . Prostate cancer Father        dx. in his late 50s/early 17s  . Breast cancer Paternal Grandmother        dx. in her 77s  . Fibromyalgia Sister   . Leukemia Paternal Grandfather        dx. in his 6s    CURRENT MEDICATIONS:  Current Outpatient Medications  Medication Sig Dispense Refill  . ALPRAZolam (XANAX) 1 MG tablet Take 1 mg by mouth 4 (four) times daily.    . citalopram (CELEXA) 20 MG tablet Take 20 mg by mouth daily.    Marland Kitchen docusate sodium (COLACE) 100 MG capsule Take 1 capsule (100 mg total) by mouth 2 (two) times daily. 60 capsule 2  . enalapril (VASOTEC) 10 MG tablet Take 1 tablet (10 mg total) by mouth daily. 90 tablet 1  . hydrochlorothiazide (HYDRODIURIL) 25 MG tablet Take 1 tablet (25 mg total) by mouth daily. Needs appointment for further refills 30 tablet 0  . loratadine (CLARITIN) 10 MG tablet Take 10 mg by mouth daily.    . Multiple Vitamin (MULTIVITAMIN WITH MINERALS) TABS tablet Take 1 tablet by mouth daily.    Marland Kitchen omeprazole (PRILOSEC) 20 MG capsule Take 1 capsule (20 mg total) by mouth daily. 90 capsule 1  . propranolol (INDERAL) 20 MG tablet Take 20 mg by mouth daily.     . Aspirin-Acetaminophen-Caffeine (GOODY HEADACHE PO) Take 1 packet by mouth every 12 (twelve) hours as needed (headache).  (Patient not taking: Reported on 05/24/2020)    .  mirtazapine (REMERON) 15 MG tablet Take 15 mg by mouth at bedtime as needed (sleep).  (Patient not taking: Reported on 05/24/2020)    . naproxen sodium (ALEVE) 220 MG tablet Take 440 mg by mouth daily as needed (pain).  (Patient not taking: Reported on 05/24/2020)    . ondansetron (ZOFRAN) 4 MG tablet Take 1 tablet (4 mg total) by mouth every 8 (eight) hours as needed for nausea or vomiting. (Patient not taking: Reported on 05/24/2020) 30 tablet 1  . oxyCODONE (ROXICODONE) 5 MG immediate release tablet Take 1 tablet (5 mg total) by mouth every 4 (four) hours as needed for severe pain  or breakthrough pain. (Patient not taking: Reported on 05/24/2020) 20 tablet 0   No current facility-administered medications for this visit.    ALLERGIES:  No Known Allergies  PHYSICAL EXAM:  Performance status (ECOG): 0 - Asymptomatic  Vitals:   05/24/20 1433  BP: 96/66  Pulse: 74  Resp: 18  Temp: (!) 97.1 F (36.2 C)  SpO2: 100%   Wt Readings from Last 3 Encounters:  05/24/20 173 lb 14.4 oz (78.9 kg)  04/16/20 170 lb (77.1 kg)  03/25/20 173 lb (78.5 kg)   Physical Exam Vitals reviewed.  Constitutional:      Appearance: Normal appearance.  Cardiovascular:     Rate and Rhythm: Normal rate and regular rhythm.     Pulses: Normal pulses.     Heart sounds: Normal heart sounds.  Pulmonary:     Effort: Pulmonary effort is normal.     Breath sounds: Normal breath sounds.  Musculoskeletal:     Right lower leg: No edema.     Left lower leg: No edema.  Neurological:     General: No focal deficit present.     Mental Status: She is alert and oriented to person, place, and time.  Psychiatric:        Behavior: Behavior normal.      LABORATORY DATA:  I have reviewed the labs as listed.  CBC Latest Ref Rng & Units 11/27/2019 05/01/2017 08/14/2016  WBC 3.4 - 10.8 x10E3/uL 6.6 6.2 6.3  Hemoglobin 11.1 - 15.9 g/dL 13.0 14.0 14.7  Hematocrit 34.0 - 46.6 % 38.2 42.4 41.7  Platelets 150 - 450 x10E3/uL 324 367  370   CMP Latest Ref Rng & Units 03/05/2020 11/27/2019 05/01/2017  Glucose 70 - 99 mg/dL 89 80 50(L)  BUN 6 - 20 mg/dL _0 Creatinine 0.44 - 1.00 mg/dL 0.78 1.02(H) 0.89  Sodium 135 - 145 mmol/L 128(L) 138 138  Potassium 3.5 - 5.1 mmol/L 3.4(L) 4.1 4.4  Chloride 98 - 111 mmol/L 89(L) 103 99  CO2 22 - 32 mmol/L 25 22 15(L)  Calcium 8.9 - 10.3 mg/dL 8.8(L) 9.4 9.1  Total Protein 6.0 - 8.5 g/dL - 6.4 6.8  Total Bilirubin 0.0 - 1.2 mg/dL - 0.4 0.3  Alkaline Phos 39 - 117 IU/L - 69 63  AST 0 - 40 IU/L - 20 34  ALT 0 - 32 IU/L - 23 33(H)    DIAGNOSTIC IMAGING:  I have independently reviewed the scans and discussed with the patient. No results found.   ASSESSMENT:  1.  Stage Ib (T2N0) left breast IDC: -Right breast lump was palpated at health department.  Bilateral mammogram and ultrasound on 02/04/2020 at [facility] showed 1.8 x 1.3 x 2.1 cm irregular hypoechoic mass at the 2 o'clock position 3 to 4 cm from nipple. -Ultrasound-guided biopsy on 02/04/2020 showed invasive ductal carcinoma, grade 2. -Left lumpectomy after needle localization and SLNB by Dr. Constance Haw on 03/08/2020. -Pathology-2.1 cm IDC, grade 1, 0/3 lymph nodes positive, PT 2 PN 0, ER 95%, PR to 70%, HER-2 negative, Ki-67 10%.  Invasive carcinoma is broadly less than 0.1 cm to the medial margin.  Reexcision margins were negative. -Oncotype DX recurrence score 16, distant recurrence risk at 9 years with tamoxifen/AI of 4%, absolute chemotherapy benefit less than 1%. -XRT to the left breast started on 05/04/2020.  2.  Family history: -Paternal grandmother had breast cancer. -Father had prostate cancer.   PLAN:  1.  Stage Ib left breast infiltrating ductal carcinoma: -She has  started radiation therapy to the left breast on 05/04/2020. -Today we talked about adjuvant endocrine therapy with ovarian suppression as she is premenopausal. -We talked about Lupron injection every 6 months versus nephrectomy.  Patient not interested in  surgical intervention at this time. -We talked about Lupron and its side effects.  We will start it as soon as we get insurance authorization. -We also talked about starting her on anastrozole.  We discussed the side effects in detail including but not limited to hot flashes, musculoskeletal symptoms, decreased bone mineral density. -We have sent prescription to her pharmacy.  I plan to see her back in 3 months for follow-up to see how she is tolerating it.  2.  Family history: -I have referred for genetic testing.  She talked to geneticist but decided not to do it. -I have talked to her again and she would like to think about it.  3.  Bone health: -We will plan to check DEXA scan in a couple of years.  We will also check periodic vitamin D levels.   Orders placed this encounter:  No orders of the defined types were placed in this encounter.  Total time spent is 40 minutes with more than 50% of the time spent face-to-face discussing Oncotype DX results, treatment plan, side effects, counseling and coordination of care.  Derek Jack, MD [place] [phone]   I, Milinda Antis, am acting as a scribe for Dr. Sanda Linger.  I, Derek Jack MD, have reviewed the above documentation for accuracy and completeness, and I agree with the above.

## 2020-05-25 DIAGNOSIS — Z170 Estrogen receptor positive status [ER+]: Secondary | ICD-10-CM | POA: Diagnosis not present

## 2020-05-25 DIAGNOSIS — C50112 Malignant neoplasm of central portion of left female breast: Secondary | ICD-10-CM | POA: Diagnosis not present

## 2020-05-26 DIAGNOSIS — C50112 Malignant neoplasm of central portion of left female breast: Secondary | ICD-10-CM | POA: Diagnosis not present

## 2020-05-26 DIAGNOSIS — Z170 Estrogen receptor positive status [ER+]: Secondary | ICD-10-CM | POA: Diagnosis not present

## 2020-05-27 DIAGNOSIS — C50112 Malignant neoplasm of central portion of left female breast: Secondary | ICD-10-CM | POA: Diagnosis not present

## 2020-05-27 DIAGNOSIS — Z170 Estrogen receptor positive status [ER+]: Secondary | ICD-10-CM | POA: Diagnosis not present

## 2020-05-27 DIAGNOSIS — C50412 Malignant neoplasm of upper-outer quadrant of left female breast: Secondary | ICD-10-CM | POA: Diagnosis not present

## 2020-05-28 DIAGNOSIS — C50112 Malignant neoplasm of central portion of left female breast: Secondary | ICD-10-CM | POA: Diagnosis not present

## 2020-05-28 DIAGNOSIS — Z170 Estrogen receptor positive status [ER+]: Secondary | ICD-10-CM | POA: Diagnosis not present

## 2020-05-31 ENCOUNTER — Encounter (HOSPITAL_COMMUNITY): Payer: Self-pay

## 2020-05-31 ENCOUNTER — Ambulatory Visit (HOSPITAL_COMMUNITY): Payer: Medicaid Other

## 2020-05-31 ENCOUNTER — Other Ambulatory Visit: Payer: Self-pay

## 2020-05-31 ENCOUNTER — Inpatient Hospital Stay (HOSPITAL_COMMUNITY): Payer: BC Managed Care – PPO

## 2020-05-31 VITALS — BP 101/69 | HR 74 | Temp 97.9°F | Resp 18

## 2020-05-31 DIAGNOSIS — C50912 Malignant neoplasm of unspecified site of left female breast: Secondary | ICD-10-CM

## 2020-05-31 DIAGNOSIS — F17210 Nicotine dependence, cigarettes, uncomplicated: Secondary | ICD-10-CM | POA: Diagnosis not present

## 2020-05-31 DIAGNOSIS — C50412 Malignant neoplasm of upper-outer quadrant of left female breast: Secondary | ICD-10-CM | POA: Diagnosis not present

## 2020-05-31 DIAGNOSIS — Z5111 Encounter for antineoplastic chemotherapy: Secondary | ICD-10-CM | POA: Diagnosis not present

## 2020-05-31 DIAGNOSIS — C50112 Malignant neoplasm of central portion of left female breast: Secondary | ICD-10-CM | POA: Diagnosis not present

## 2020-05-31 DIAGNOSIS — Z170 Estrogen receptor positive status [ER+]: Secondary | ICD-10-CM | POA: Diagnosis not present

## 2020-05-31 DIAGNOSIS — Z806 Family history of leukemia: Secondary | ICD-10-CM | POA: Diagnosis not present

## 2020-05-31 DIAGNOSIS — Z803 Family history of malignant neoplasm of breast: Secondary | ICD-10-CM | POA: Diagnosis not present

## 2020-05-31 MED ORDER — LEUPROLIDE ACETATE 7.5 MG IM KIT
7.5000 mg | PACK | Freq: Once | INTRAMUSCULAR | Status: AC
  Administered 2020-05-31: 7.5 mg via INTRAMUSCULAR
  Filled 2020-05-31: qty 7.5

## 2020-05-31 MED ORDER — LEUPROLIDE ACETATE (6 MONTH) 45 MG ~~LOC~~ KIT: 45.0000 mg | PACK | Freq: Once | SUBCUTANEOUS | Status: DC

## 2020-05-31 NOTE — Progress Notes (Signed)
05/31/20  DC Lupron 45 mg q 93month order and change to:  Lupron 7.5 mg IM today return in 4 weeks for Lupron 11.25 mg to be given q 12 weeks.  T.O. Dr Jim Desanctis, RN/Margaree Sandhu Ronnald Ramp, PharmD

## 2020-05-31 NOTE — Patient Instructions (Addendum)
[place] at [facility] Discharge Instructions  Received Lupron injection today. Follow-up as scheduled   Thank you for choosing [place] at [facility] to provide your oncology and hematology care.  To afford each patient quality time with our provider, please arrive at least 15 minutes before your scheduled appointment time.   If you have a lab appointment with the [facility] please come in thru the Main Entrance and check in at the main information desk.  You need to re-schedule your appointment should you arrive 10 or more minutes late.  We strive to give you quality time with our providers, and arriving late affects you and other patients whose appointments are after yours.  Also, if you no show three or more times for appointments you may be dismissed from the clinic at the providers discretion.     Again, thank you for choosing [facility]  Our hope is that these requests will decrease the amount of time that you wait before being seen by our physicians.       _____________________________________________________________  Should you have questions after your visit to [facility], please contact our office at [phone] and follow the prompts.  Our office hours are 8:00 a.m. and 4:30 p.m. Monday - Friday.  Please note that voicemails left after 4:00 p.m. may not be returned until the following business day.  We are closed weekends and major holidays.  You do have access to a nurse 24-7, just call the main number to the clinic [phone] and do not press any options, hold on the line and a nurse will answer the phone.    For prescription refill requests, have your pharmacy contact our office and allow 72 hours.    Due to Covid, you will need to wear a mask upon entering the hospital. If you do not have a mask, a mask will be given to you at the Main Entrance upon arrival. For doctor visits, patients may have  1 support person age 54 or older with them. For treatment visits, patients can not have anyone with them due to social distancing guidelines and our immunocompromised population.

## 2020-05-31 NOTE — Progress Notes (Signed)
Doris Newman tolerated Lupron injection well without complaints or incident.Reviewed Lupron side effects with pt who verbalized understanding. VSS pt discharged self ambulatory in satisfactory condition

## 2020-06-01 DIAGNOSIS — Z170 Estrogen receptor positive status [ER+]: Secondary | ICD-10-CM | POA: Diagnosis not present

## 2020-06-01 DIAGNOSIS — C50112 Malignant neoplasm of central portion of left female breast: Secondary | ICD-10-CM | POA: Diagnosis not present

## 2020-06-02 DIAGNOSIS — Z170 Estrogen receptor positive status [ER+]: Secondary | ICD-10-CM | POA: Diagnosis not present

## 2020-06-02 DIAGNOSIS — C50112 Malignant neoplasm of central portion of left female breast: Secondary | ICD-10-CM | POA: Diagnosis not present

## 2020-06-28 ENCOUNTER — Other Ambulatory Visit: Payer: Self-pay

## 2020-06-28 ENCOUNTER — Encounter (HOSPITAL_COMMUNITY): Payer: Self-pay

## 2020-06-28 ENCOUNTER — Inpatient Hospital Stay (HOSPITAL_COMMUNITY): Payer: BC Managed Care – PPO | Attending: Hematology

## 2020-06-28 VITALS — BP 135/96 | HR 70 | Temp 97.9°F | Resp 18

## 2020-06-28 DIAGNOSIS — Z79818 Long term (current) use of other agents affecting estrogen receptors and estrogen levels: Secondary | ICD-10-CM | POA: Diagnosis not present

## 2020-06-28 DIAGNOSIS — C50412 Malignant neoplasm of upper-outer quadrant of left female breast: Secondary | ICD-10-CM | POA: Diagnosis not present

## 2020-06-28 DIAGNOSIS — C50912 Malignant neoplasm of unspecified site of left female breast: Secondary | ICD-10-CM

## 2020-06-28 DIAGNOSIS — Z170 Estrogen receptor positive status [ER+]: Secondary | ICD-10-CM | POA: Insufficient documentation

## 2020-06-28 MED ORDER — LEUPROLIDE ACETATE (3 MONTH) 11.25 MG IM KIT
11.2500 mg | PACK | Freq: Once | INTRAMUSCULAR | Status: AC
  Administered 2020-06-28: 11.25 mg via INTRAMUSCULAR
  Filled 2020-06-28: qty 11.25

## 2020-06-28 NOTE — Progress Notes (Signed)
Doris Newman presents today for injection per the provider's orders.  Lupron injection administration without incident; injection site WNL; see MAR for injection details.  Patient tolerated procedure well and without incident.  No questions or complaints noted at this time.

## 2020-06-28 NOTE — Patient Instructions (Signed)
[facility] at [facility]  Discharge Instructions:   _______________________________________________________________  Thank you for choosing [facility] at [facility] to provide your oncology and hematology care.  To afford each patient quality time with our providers, please arrive at least 15 minutes before your scheduled appointment.  You need to re-schedule your appointment if you arrive 10 or more minutes late.  We strive to give you quality time with our providers, and arriving late affects you and other patients whose appointments are after yours.  Also, if you no show three or more times for appointments you may be dismissed from the clinic.  Again, thank you for choosing [facility] at [facility]. Our hope is that these requests will allow you access to exceptional care and in a timely manner. _______________________________________________________________  If you have questions after your visit, please contact our office at [phone] between the hours of 8:30 a.m. and 5:00 p.m. Voicemails left after 4:30 p.m. will not be returned until the following business day. _______________________________________________________________  For prescription refill requests, have your pharmacy contact our office. _______________________________________________________________  Recommendations made by the consultant and any test results will be sent to your referring physician. _______________________________________________________________ 

## 2020-07-05 DIAGNOSIS — C50112 Malignant neoplasm of central portion of left female breast: Secondary | ICD-10-CM | POA: Diagnosis not present

## 2020-07-05 DIAGNOSIS — Z170 Estrogen receptor positive status [ER+]: Secondary | ICD-10-CM | POA: Diagnosis not present

## 2020-07-16 ENCOUNTER — Other Ambulatory Visit: Payer: Self-pay | Admitting: Family Medicine

## 2020-07-16 DIAGNOSIS — I10 Essential (primary) hypertension: Secondary | ICD-10-CM

## 2020-07-16 DIAGNOSIS — R609 Edema, unspecified: Secondary | ICD-10-CM

## 2020-07-16 DIAGNOSIS — I83813 Varicose veins of bilateral lower extremities with pain: Secondary | ICD-10-CM

## 2020-07-26 ENCOUNTER — Ambulatory Visit (HOSPITAL_COMMUNITY): Payer: BC Managed Care – PPO

## 2020-08-12 ENCOUNTER — Other Ambulatory Visit: Payer: Self-pay | Admitting: Family Medicine

## 2020-08-12 DIAGNOSIS — R609 Edema, unspecified: Secondary | ICD-10-CM

## 2020-08-12 DIAGNOSIS — I10 Essential (primary) hypertension: Secondary | ICD-10-CM

## 2020-08-12 DIAGNOSIS — I83813 Varicose veins of bilateral lower extremities with pain: Secondary | ICD-10-CM

## 2020-08-12 NOTE — Telephone Encounter (Signed)
[name]. NTBS 30 days given 07/16/20

## 2020-08-13 NOTE — Telephone Encounter (Signed)
Left message for patient to call back to schedule an appointment for medication refill.

## 2020-08-18 ENCOUNTER — Other Ambulatory Visit (HOSPITAL_COMMUNITY): Payer: BC Managed Care – PPO

## 2020-08-19 ENCOUNTER — Other Ambulatory Visit: Payer: Self-pay

## 2020-08-19 ENCOUNTER — Inpatient Hospital Stay (HOSPITAL_COMMUNITY): Payer: BC Managed Care – PPO | Attending: Hematology

## 2020-08-19 DIAGNOSIS — R232 Flushing: Secondary | ICD-10-CM | POA: Insufficient documentation

## 2020-08-19 DIAGNOSIS — Z79899 Other long term (current) drug therapy: Secondary | ICD-10-CM | POA: Insufficient documentation

## 2020-08-19 DIAGNOSIS — F329 Major depressive disorder, single episode, unspecified: Secondary | ICD-10-CM | POA: Insufficient documentation

## 2020-08-19 DIAGNOSIS — C50912 Malignant neoplasm of unspecified site of left female breast: Secondary | ICD-10-CM | POA: Insufficient documentation

## 2020-08-19 DIAGNOSIS — Z923 Personal history of irradiation: Secondary | ICD-10-CM | POA: Diagnosis not present

## 2020-08-19 DIAGNOSIS — R51 Headache with orthostatic component, not elsewhere classified: Secondary | ICD-10-CM | POA: Diagnosis not present

## 2020-08-19 DIAGNOSIS — Z803 Family history of malignant neoplasm of breast: Secondary | ICD-10-CM | POA: Diagnosis not present

## 2020-08-19 DIAGNOSIS — Z8041 Family history of malignant neoplasm of ovary: Secondary | ICD-10-CM | POA: Diagnosis not present

## 2020-08-19 DIAGNOSIS — Z170 Estrogen receptor positive status [ER+]: Secondary | ICD-10-CM | POA: Diagnosis not present

## 2020-08-19 DIAGNOSIS — Z79811 Long term (current) use of aromatase inhibitors: Secondary | ICD-10-CM | POA: Insufficient documentation

## 2020-08-19 LAB — CBC WITH DIFFERENTIAL/PLATELET
Abs Immature Granulocytes: 0.02 10*3/uL (ref 0.00–0.07)
Basophils Absolute: 0 10*3/uL (ref 0.0–0.1)
Basophils Relative: 1 %
Eosinophils Absolute: 0.1 10*3/uL (ref 0.0–0.5)
Eosinophils Relative: 2 %
HCT: 41.8 % (ref 36.0–46.0)
Hemoglobin: 13.8 g/dL (ref 12.0–15.0)
Immature Granulocytes: 0 %
Lymphocytes Relative: 21 %
Lymphs Abs: 1.2 10*3/uL (ref 0.7–4.0)
MCH: 30.5 pg (ref 26.0–34.0)
MCHC: 33 g/dL (ref 30.0–36.0)
MCV: 92.3 fL (ref 80.0–100.0)
Monocytes Absolute: 0.4 10*3/uL (ref 0.1–1.0)
Monocytes Relative: 8 %
Neutro Abs: 3.9 10*3/uL (ref 1.7–7.7)
Neutrophils Relative %: 68 %
Platelets: 326 10*3/uL (ref 150–400)
RBC: 4.53 MIL/uL (ref 3.87–5.11)
RDW: 13.6 % (ref 11.5–15.5)
WBC: 5.7 10*3/uL (ref 4.0–10.5)
nRBC: 0 % (ref 0.0–0.2)

## 2020-08-19 LAB — COMPREHENSIVE METABOLIC PANEL
ALT: 37 U/L (ref 0–44)
AST: 35 U/L (ref 15–41)
Albumin: 4.4 g/dL (ref 3.5–5.0)
Alkaline Phosphatase: 94 U/L (ref 38–126)
Anion gap: 12 (ref 5–15)
BUN: 17 mg/dL (ref 6–20)
CO2: 25 mmol/L (ref 22–32)
Calcium: 9.6 mg/dL (ref 8.9–10.3)
Chloride: 99 mmol/L (ref 98–111)
Creatinine, Ser: 1.05 mg/dL — ABNORMAL HIGH (ref 0.44–1.00)
GFR, Estimated: >60 mL/min (ref >60)
Glucose, Bld: 94 mg/dL (ref 70–99)
Potassium: 4 mmol/L (ref 3.5–5.1)
Sodium: 136 mmol/L (ref 135–145)
Total Bilirubin: 0.8 mg/dL (ref 0.3–1.2)
Total Protein: 8 g/dL (ref 6.5–8.1)

## 2020-08-19 LAB — VITAMIN D 25 HYDROXY (VIT D DEFICIENCY, FRACTURES): Vit D, 25-Hydroxy: 35.29 ng/mL (ref 30–100)

## 2020-08-23 ENCOUNTER — Ambulatory Visit (HOSPITAL_COMMUNITY): Payer: BC Managed Care – PPO

## 2020-08-24 ENCOUNTER — Inpatient Hospital Stay (HOSPITAL_BASED_OUTPATIENT_CLINIC_OR_DEPARTMENT_OTHER): Payer: BC Managed Care – PPO | Admitting: Hematology

## 2020-08-24 ENCOUNTER — Ambulatory Visit (HOSPITAL_COMMUNITY): Payer: BC Managed Care – PPO

## 2020-08-24 ENCOUNTER — Other Ambulatory Visit: Payer: Self-pay

## 2020-08-24 VITALS — BP 100/66 | HR 86 | Temp 96.9°F | Resp 18 | Wt 170.6 lb

## 2020-08-24 DIAGNOSIS — R51 Headache with orthostatic component, not elsewhere classified: Secondary | ICD-10-CM | POA: Diagnosis not present

## 2020-08-24 DIAGNOSIS — Z170 Estrogen receptor positive status [ER+]: Secondary | ICD-10-CM | POA: Diagnosis not present

## 2020-08-24 DIAGNOSIS — F329 Major depressive disorder, single episode, unspecified: Secondary | ICD-10-CM | POA: Diagnosis not present

## 2020-08-24 DIAGNOSIS — Z803 Family history of malignant neoplasm of breast: Secondary | ICD-10-CM | POA: Diagnosis not present

## 2020-08-24 DIAGNOSIS — C50912 Malignant neoplasm of unspecified site of left female breast: Secondary | ICD-10-CM | POA: Diagnosis not present

## 2020-08-24 DIAGNOSIS — R232 Flushing: Secondary | ICD-10-CM | POA: Diagnosis not present

## 2020-08-24 DIAGNOSIS — Z923 Personal history of irradiation: Secondary | ICD-10-CM | POA: Diagnosis not present

## 2020-08-24 DIAGNOSIS — Z79811 Long term (current) use of aromatase inhibitors: Secondary | ICD-10-CM | POA: Diagnosis not present

## 2020-08-24 DIAGNOSIS — Z8041 Family history of malignant neoplasm of ovary: Secondary | ICD-10-CM | POA: Diagnosis not present

## 2020-08-24 DIAGNOSIS — Z79899 Other long term (current) drug therapy: Secondary | ICD-10-CM | POA: Diagnosis not present

## 2020-08-24 MED ORDER — VENLAFAXINE HCL ER 37.5 MG PO CP24: 37.5000 mg | ORAL_CAPSULE | Freq: Every day | ORAL | 3 refills | Status: DC

## 2020-08-24 NOTE — Progress Notes (Signed)
[place] [address], [place] [address]   Patient Care Team: [name], [name], MD as PCP - General (Family Medicine) Donetta Potts, RN as Oncology Nurse Navigator (Oncology) Derek Jack, MD as Medical Oncologist (Oncology)  SUMMARY OF ONCOLOGIC HISTORY: Oncology History  Invasive ductal carcinoma of left breast ([place])  02/19/2020 Initial Diagnosis   Invasive ductal carcinoma of left breast ([place])   04/06/2020 Genetic Testing   Oncotype DX Breast Recurrence Score Report        CHIEF COMPLIANT: Follow-up for invasive ductal carcinoma of left breast   INTERVAL HISTORY: [name] is a 49 y.o. female here today for follow up of her left breast cancer. Her last visit was on 05/24/2020.   Today she is accompanied by her mother and she reports feeling okay. She is taking Arimidex and reports having horrible hot flashes during the day and night, about 12 episodes daily, including waking her up at night, but denies joint aches. Her hot flashes are not improving. She is taking calcium and vitamin D daily. She takes Goody powder for her headache. She sees a psychiatrist in [place] for her depression since her husband passed away and was prescribed Xanax, Celexa and Remeron for her anxiety and depression.  She does not want to proceed with genetic testing.    REVIEW OF SYSTEMS:   Review of Systems  Constitutional: Negative for appetite change and fatigue.  Endocrine: Positive for hot flashes.  Musculoskeletal: Negative for arthralgias.  Neurological: Positive for headaches (occasional) and numbness (hands).  All other systems reviewed and are negative.   I have reviewed the past medical history, past surgical history, social history and family history with the patient and they are unchanged from previous note.   ALLERGIES:   has No Known Allergies.   MEDICATIONS:  Current Outpatient Medications  Medication Sig Dispense Refill    . ALPRAZolam (XANAX) 1 MG tablet Take 1 mg by mouth 4 (four) times daily.    Marland Kitchen anastrozole (ARIMIDEX) 1 MG tablet Take 1 tablet (1 mg total) by mouth daily. 90 tablet 3  . Aspirin-Acetaminophen-Caffeine (GOODY HEADACHE PO) Take 1 packet by mouth every 12 (twelve) hours as needed (headache).     . citalopram (CELEXA) 20 MG tablet Take 20 mg by mouth daily.    Marland Kitchen docusate sodium (COLACE) 100 MG capsule Take 1 capsule (100 mg total) by mouth 2 (two) times daily. 60 capsule 2  . enalapril (VASOTEC) 10 MG tablet Take 1 tablet (10 mg total) by mouth daily. (Needs to be seen before next refill) 30 tablet 0  . hydrochlorothiazide (HYDRODIURIL) 25 MG tablet Take 1 tablet (25 mg total) by mouth daily. (Needs to be seen before next refill) 30 tablet 0  . loratadine (CLARITIN) 10 MG tablet Take 10 mg by mouth daily.    . mirtazapine (REMERON) 15 MG tablet Take 15 mg by mouth at bedtime as needed (sleep).     . Multiple Vitamin (MULTIVITAMIN WITH MINERALS) TABS tablet Take 1 tablet by mouth daily.    . naproxen sodium (ALEVE) 220 MG tablet Take 440 mg by mouth daily as needed (pain).     Marland Kitchen omeprazole (PRILOSEC) 20 MG capsule Take 1 capsule (20 mg total) by mouth daily. (Needs to be seen before next refill) 30 capsule 0  . ondansetron (ZOFRAN) 4 MG tablet Take 1 tablet (4 mg total) by mouth every 8 (eight) hours as needed for nausea or vomiting. 30 tablet 1  .  propranolol (INDERAL) 20 MG tablet Take 20 mg by mouth daily.      No current facility-administered medications for this visit.     PHYSICAL EXAMINATION: Performance status (ECOG): 0 - Asymptomatic  Vitals:   08/24/20 1537  BP: 100/66  Pulse: 86  Resp: 18  Temp: (!) 96.9 F (36.1 C)  SpO2: 99%   Wt Readings from Last 3 Encounters:  08/24/20 170 lb 9.6 oz (77.4 kg)  05/24/20 173 lb 14.4 oz (78.9 kg)  04/16/20 170 lb (77.1 kg)   Physical Exam Vitals reviewed.  Constitutional:      Appearance: Normal appearance.  Cardiovascular:      Rate and Rhythm: Normal rate and regular rhythm.     Pulses: Normal pulses.     Heart sounds: Normal heart sounds.  Pulmonary:     Effort: Pulmonary effort is normal.     Breath sounds: Normal breath sounds.  Chest:     Breasts:        Right: Normal. No mass, skin change or tenderness.        Left: Skin change (upper outer lumpectomy scar thickness) present. No mass or tenderness.  Abdominal:     Palpations: Abdomen is soft. There is no mass.     Tenderness: There is no abdominal tenderness.     Hernia: No hernia is present.  Neurological:     General: No focal deficit present.     Mental Status: She is alert and oriented to person, place, and time.  Psychiatric:        Mood and Affect: Mood normal.        Behavior: Behavior normal.     Breast Exam Chaperone: Milinda Antis, MD     LABORATORY DATA:  I have reviewed the data as listed CMP Latest Ref Rng & Units 08/19/2020 03/05/2020 11/27/2019  Glucose 70 - 99 mg/dL 94 89 80  BUN 6 - 20 mg/dL _0 Creatinine 0.44 - 1.00 mg/dL 1.05(H) 0.78 1.02(H)  Sodium 135 - 145 mmol/L 136 128(L) 138  Potassium 3.5 - 5.1 mmol/L 4.0 3.4(L) 4.1  Chloride 98 - 111 mmol/L 99 89(L) 103  CO2 22 - 32 mmol/L _1 Calcium 8.9 - 10.3 mg/dL 9.6 8.8(L) 9.4  Total Protein 6.5 - 8.1 g/dL 8.0 - 6.4  Total Bilirubin 0.3 - 1.2 mg/dL 0.8 - 0.4  Alkaline Phos 38 - 126 U/L 94 - 69  AST 15 - 41 U/L 35 - 20  ALT 0 - 44 U/L 37 - 23   No results found for: CAN153 Lab Results  Component Value Date   WBC 5.7 08/19/2020   HGB 13.8 08/19/2020   HCT 41.8 08/19/2020   MCV 92.3 08/19/2020   PLT 326 08/19/2020   NEUTROABS 3.9 08/19/2020   Lab Results  Component Value Date   VD25OH 35.29 08/19/2020   VD25OH 22.2 (L) 12/06/2015   VD25OH 42.1 08/05/2013    ASSESSMENT:  1. Stage Ib (T2N0) left breast IDC: -Right breast lump was palpated at health department. [facility] 2.1 cm  irregular hypoechoic mass at the 2 o'clock position 3 to 4 cm from nipple. -Ultrasound-guided biopsy on 02/04/2020 showed invasive ductal carcinoma, grade 2. -Left lumpectomy after needle localization and SLNB by Dr. Constance Haw on 03/08/2020. -Pathology-2.1 cm IDC, grade 1, 0/3 lymph nodes positive, PT 2 PN 0, ER 95%, PR to 70%, HER-2 negative, Ki-67 10%. Invasive carcinoma is broadly  less than 0.1 cm to the medial margin. Reexcision margins were negative. -Oncotype DX recurrence score 16, distant recurrence risk at 9 years with tamoxifen/AI of 4%, absolute chemotherapy benefit less than 1%. -XRT to the left breast started on 05/04/2020. -Lupron started on 05/31/2020 along with anastrozole.  2. Family history: -Paternal grandmother had breast cancer. -Father had prostate cancer.   PLAN:  1. Stage Ib left breast infiltrating ductal carcinoma: -She is continuing anastrozole and Lupron.  She is due for next Lupron shot in December. -Physical exam today did not reveal any palpable masses.  Thickening at the left lumpectomy scar in the upper outer quadrant is stable.  No palpable adenopathy. -I reviewed labs from 08/19/2020 which showed normal chemistries and CBC. -RTC 4 months with repeat labs. -We will schedule her mammogram in April of next year.  2. Family history: -We talked about genetic testing.  She does not want to do it at this time.  3.  Bone health: -Plan to repeat DEXA scan in 2 years. -Continue calcium and vitamin D.  4.  Hot flashes: -Reports hot flashes both during day and night.  They have been worse since the start of anastrozole and Lupron. -Recommend Effexor XR 37.5 mg for 1 week followed by 75 mg daily. -She is on Celexa.  As they are in the same class, I have recommended discontinuing Celexa and starting Effexor.  She wants to confirm with her psychiatrist and start it.   Breast Cancer therapy associated bone loss: I have recommended calcium, Vitamin D and weight  bearing exercises.   No orders of the defined types were placed in this encounter.  The patient has a good understanding of the overall plan. she agrees with it. she will call with any problems that may develop before the next visit here.    Derek Jack, MD [place] [phone]   I, Milinda Antis, am acting as a scribe for Dr. Sanda Linger.  I, Derek Jack MD, have reviewed the above documentation for accuracy and completeness, and I agree with the above.

## 2020-08-24 NOTE — Patient Instructions (Addendum)
[place] at [facility] Discharge Instructions  You were seen today by Dr. Delton Coombes. He went over your recent results. You will be prescribed Effexor to take for your hot flashes; take 1 tablet (37.5 mg) daily for 1 week, then take 2 tablets (75 mg) daily. You may stop taking citalopram and take Effexor instead. You will receive your Lupron injection in December and every 3 months subsequently. You will be scheduled for a mammogram after 02/03/2021. Dr. Delton Coombes will see you back after the mammogram for labs and follow up.   Thank you for choosing [place] at [facility] to provide your oncology and hematology care.  To afford each patient quality time with our provider, please arrive at least 15 minutes before your scheduled appointment time.   If you have a lab appointment with the [facility] please come in thru the Main Entrance and check in at the main information desk  You need to re-schedule your appointment should you arrive 10 or more minutes late.  We strive to give you quality time with our providers, and arriving late affects you and other patients whose appointments are after yours.  Also, if you no show three or more times for appointments you may be dismissed from the clinic at the providers discretion.     Again, thank you for choosing [facility].  Our hope is that these requests will decrease the amount of time that you wait before being seen by our physicians.       _____________________________________________________________  Should you have questions after your visit to [facility], please contact our office at [phone] between the hours of 8:00 a.m. and 4:30 p.m.  Voicemails left after 4:00 p.m. will not be returned until the following business day.  For prescription refill requests, have your pharmacy contact our office and allow 72 hours.    Cancer Center Support Programs:   > Cancer  Support Group  2nd Tuesday of the month 1pm-2pm, Journey Room

## 2020-08-24 NOTE — Progress Notes (Signed)
Patient is taking anastrozole as prescribed and only side effect is hot flashes.

## 2020-08-25 ENCOUNTER — Telehealth: Payer: Self-pay | Admitting: Family Medicine

## 2020-08-25 ENCOUNTER — Ambulatory Visit (HOSPITAL_COMMUNITY): Payer: BC Managed Care – PPO | Admitting: Hematology

## 2020-08-25 ENCOUNTER — Other Ambulatory Visit (HOSPITAL_COMMUNITY): Payer: Self-pay | Admitting: *Deleted

## 2020-08-25 DIAGNOSIS — I10 Essential (primary) hypertension: Secondary | ICD-10-CM

## 2020-08-25 DIAGNOSIS — R600 Localized edema: Secondary | ICD-10-CM

## 2020-08-25 DIAGNOSIS — R609 Edema, unspecified: Secondary | ICD-10-CM

## 2020-08-25 DIAGNOSIS — C50912 Malignant neoplasm of unspecified site of left female breast: Secondary | ICD-10-CM

## 2020-08-25 DIAGNOSIS — I83813 Varicose veins of bilateral lower extremities with pain: Secondary | ICD-10-CM

## 2020-08-25 MED ORDER — HYDROCHLOROTHIAZIDE 25 MG PO TABS: 25.0000 mg | ORAL_TABLET | Freq: Every day | ORAL | 0 refills | Status: DC

## 2020-08-25 NOTE — Telephone Encounter (Signed)
Pt stated that she needed to be seen for a med refill but was unable to take the appts offered to her because of her work schedule. Stated that the only medication she thought she would run out of would be hydrochlorothiazide and wants to know if it can be refilled. Did not make an appt

## 2020-08-25 NOTE — Telephone Encounter (Signed)
LMOVM will send in a 30d supply, to call the office back to make an appt in the next mos, her last checkup was in February

## 2020-09-02 DIAGNOSIS — F3341 Major depressive disorder, recurrent, in partial remission: Secondary | ICD-10-CM | POA: Diagnosis not present

## 2020-09-02 DIAGNOSIS — F4321 Adjustment disorder with depressed mood: Secondary | ICD-10-CM | POA: Diagnosis not present

## 2020-09-19 DIAGNOSIS — J0100 Acute maxillary sinusitis, unspecified: Secondary | ICD-10-CM | POA: Diagnosis not present

## 2020-09-19 DIAGNOSIS — R059 Cough, unspecified: Secondary | ICD-10-CM | POA: Diagnosis not present

## 2020-09-19 DIAGNOSIS — Z6826 Body mass index (BMI) 26.0-26.9, adult: Secondary | ICD-10-CM | POA: Diagnosis not present

## 2020-09-20 ENCOUNTER — Ambulatory Visit (HOSPITAL_COMMUNITY): Payer: BC Managed Care – PPO

## 2020-09-21 ENCOUNTER — Other Ambulatory Visit: Payer: Self-pay

## 2020-09-21 ENCOUNTER — Inpatient Hospital Stay (HOSPITAL_COMMUNITY): Payer: BC Managed Care – PPO | Attending: Hematology

## 2020-09-21 VITALS — BP 105/71 | HR 72 | Temp 97.2°F | Resp 18

## 2020-09-21 DIAGNOSIS — C50912 Malignant neoplasm of unspecified site of left female breast: Secondary | ICD-10-CM

## 2020-09-21 DIAGNOSIS — Z170 Estrogen receptor positive status [ER+]: Secondary | ICD-10-CM | POA: Insufficient documentation

## 2020-09-21 DIAGNOSIS — C50412 Malignant neoplasm of upper-outer quadrant of left female breast: Secondary | ICD-10-CM | POA: Insufficient documentation

## 2020-09-21 DIAGNOSIS — Z79818 Long term (current) use of other agents affecting estrogen receptors and estrogen levels: Secondary | ICD-10-CM | POA: Insufficient documentation

## 2020-09-21 MED ORDER — LEUPROLIDE ACETATE (3 MONTH) 11.25 MG IM KIT
11.2500 mg | PACK | Freq: Once | INTRAMUSCULAR | Status: AC
  Administered 2020-09-21: 11.25 mg via INTRAMUSCULAR
  Filled 2020-09-21: qty 11.25

## 2020-09-21 NOTE — Progress Notes (Signed)
Patient presents today for Lupron injection.  Vital signs stable.  No new complaints since last visit.  Injection given, no blood present upon aspiration.  Injection and injection site charted in the [facility].  Injection site within normals limits.  Patient tolerated well.  Patient discharged ambulatory, alert and oriented.

## 2020-09-21 NOTE — Patient Instructions (Signed)
[facility] Discharge Instructions for Patients   Today you received the following lupron  To help prevent nausea and vomiting after your treatment, we encourage you to take your nausea medication    If you develop nausea and vomiting that is not controlled by your nausea medication, call the clinic.   BELOW ARE SYMPTOMS THAT SHOULD BE REPORTED IMMEDIATELY:  *FEVER GREATER THAN 100.5 F  *CHILLS WITH OR WITHOUT FEVER  NAUSEA AND VOMITING THAT IS NOT CONTROLLED WITH YOUR NAUSEA MEDICATION  *UNUSUAL SHORTNESS OF BREATH  *UNUSUAL BRUISING OR BLEEDING  TENDERNESS IN MOUTH AND THROAT WITH OR WITHOUT PRESENCE OF ULCERS  *URINARY PROBLEMS  *BOWEL PROBLEMS  UNUSUAL RASH Items with * indicate a potential emergency and should be followed up as soon as possible.  Feel free to call the clinic should you have any questions or concerns. The clinic phone number is [phone].  Please show the [place] at check-in to the Emergency Department and triage nurse.

## 2020-09-29 ENCOUNTER — Telehealth: Payer: Self-pay | Admitting: Family Medicine

## 2020-09-29 DIAGNOSIS — R609 Edema, unspecified: Secondary | ICD-10-CM

## 2020-09-29 DIAGNOSIS — I83813 Varicose veins of bilateral lower extremities with pain: Secondary | ICD-10-CM

## 2020-09-29 DIAGNOSIS — I10 Essential (primary) hypertension: Secondary | ICD-10-CM

## 2020-09-29 NOTE — Telephone Encounter (Signed)
[name] NTBS 30 days given 08/25/20

## 2020-09-30 ENCOUNTER — Other Ambulatory Visit: Payer: Self-pay | Admitting: *Deleted

## 2020-09-30 DIAGNOSIS — I10 Essential (primary) hypertension: Secondary | ICD-10-CM

## 2020-09-30 DIAGNOSIS — R609 Edema, unspecified: Secondary | ICD-10-CM

## 2020-09-30 DIAGNOSIS — I83813 Varicose veins of bilateral lower extremities with pain: Secondary | ICD-10-CM

## 2020-09-30 MED ORDER — HYDROCHLOROTHIAZIDE 25 MG PO TABS: 25.0000 mg | ORAL_TABLET | Freq: Every day | ORAL | 0 refills | Status: DC

## 2020-09-30 NOTE — Telephone Encounter (Signed)
LMTCB JBB 12/16

## 2020-09-30 NOTE — Telephone Encounter (Signed)
Pt scheduled apt with [name] 11/11/2020 at 12:55. That is the next available with [name] and when she is off work. She will need med refill before then.

## 2020-09-30 NOTE — Telephone Encounter (Signed)
REFILL SENT 

## 2020-10-19 DIAGNOSIS — C50912 Malignant neoplasm of unspecified site of left female breast: Secondary | ICD-10-CM | POA: Diagnosis not present

## 2020-10-19 DIAGNOSIS — Z853 Personal history of malignant neoplasm of breast: Secondary | ICD-10-CM | POA: Diagnosis not present

## 2020-10-19 DIAGNOSIS — L905 Scar conditions and fibrosis of skin: Secondary | ICD-10-CM | POA: Diagnosis not present

## 2020-10-19 DIAGNOSIS — Z79818 Long term (current) use of other agents affecting estrogen receptors and estrogen levels: Secondary | ICD-10-CM | POA: Diagnosis not present

## 2020-10-19 DIAGNOSIS — Z923 Personal history of irradiation: Secondary | ICD-10-CM | POA: Diagnosis not present

## 2020-10-19 DIAGNOSIS — G8929 Other chronic pain: Secondary | ICD-10-CM | POA: Diagnosis not present

## 2020-10-19 DIAGNOSIS — Z79811 Long term (current) use of aromatase inhibitors: Secondary | ICD-10-CM | POA: Diagnosis not present

## 2020-10-19 DIAGNOSIS — Z08 Encounter for follow-up examination after completed treatment for malignant neoplasm: Secondary | ICD-10-CM | POA: Diagnosis not present

## 2020-10-19 DIAGNOSIS — M545 Low back pain, unspecified: Secondary | ICD-10-CM | POA: Diagnosis not present

## 2020-11-09 ENCOUNTER — Other Ambulatory Visit: Payer: Self-pay | Admitting: Family Medicine

## 2020-11-09 DIAGNOSIS — R609 Edema, unspecified: Secondary | ICD-10-CM

## 2020-11-09 DIAGNOSIS — I10 Essential (primary) hypertension: Secondary | ICD-10-CM

## 2020-11-09 DIAGNOSIS — I83813 Varicose veins of bilateral lower extremities with pain: Secondary | ICD-10-CM

## 2020-11-11 ENCOUNTER — Other Ambulatory Visit: Payer: Self-pay

## 2020-11-11 ENCOUNTER — Encounter: Payer: Self-pay | Admitting: Family Medicine

## 2020-11-11 ENCOUNTER — Ambulatory Visit: Payer: BC Managed Care – PPO | Admitting: Family Medicine

## 2020-11-11 DIAGNOSIS — R609 Edema, unspecified: Secondary | ICD-10-CM | POA: Diagnosis not present

## 2020-11-11 DIAGNOSIS — I83813 Varicose veins of bilateral lower extremities with pain: Secondary | ICD-10-CM | POA: Diagnosis not present

## 2020-11-11 DIAGNOSIS — I10 Essential (primary) hypertension: Secondary | ICD-10-CM

## 2020-11-11 MED ORDER — HYDROCHLOROTHIAZIDE 25 MG PO TABS: 25.0000 mg | ORAL_TABLET | Freq: Every day | ORAL | 3 refills | Status: DC

## 2020-11-11 MED ORDER — OMEPRAZOLE 20 MG PO CPDR: 20.0000 mg | DELAYED_RELEASE_CAPSULE | Freq: Every day | ORAL | 3 refills | Status: DC

## 2020-11-11 MED ORDER — ENALAPRIL MALEATE 10 MG PO TABS: 10.0000 mg | ORAL_TABLET | Freq: Every day | ORAL | 3 refills | Status: DC

## 2020-11-11 NOTE — Progress Notes (Signed)
BP (!) 131/92   Pulse 93   Ht 5\' 6"  (1.676 m)   Wt 170 lb (77.1 kg)   SpO2 99%   BMI 27.44 kg/m    Subjective:   Patient ID: Doris Newman, female    DOB: 1971/07/07, 50 y.o.   MRN: [id number]  HPI: Doris Newman is a 50 y.o. female presenting on 11/11/2020 for Medical Management of Chronic Issues and Hypertension   HPI Hypertension Patient is currently on hydrochlorothiazide and enalapril and propranolol, and their blood pressure today is 131/92, typically runs better than this.. Patient denies any lightheadedness or dizziness. Patient denies headaches, blurred vision, chest pains, shortness of breath, or weakness. Denies any side effects from medication and is content with current medication.   Patient is coming in for recheck of peripheral edema and varicose veins.  She says is of been stable and not causing as many issues.  She continues to take a fluid pill and gets a little bit of swelling but has been doing well with them.  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  She is currently on Arimidex to help suppress.  She does have hot flash symptoms and they just switch her to Effexor for this.  Relevant past medical, surgical, family and social history reviewed and updated as indicated. Interim medical history since our last visit reviewed. Allergies and medications reviewed and updated.  Review of Systems  Constitutional: Negative for chills and fever.  Eyes: Negative for redness and visual disturbance.  Respiratory: Negative for chest tightness and shortness of breath.   Cardiovascular: Negative for chest pain and leg swelling.  Endocrine: Positive for heat intolerance.  Genitourinary: Negative for difficulty urinating and dysuria.  Musculoskeletal: Negative for back pain and gait problem.  Skin: Negative for rash.  Neurological:  Negative for light-headedness and headaches.  Psychiatric/Behavioral: Negative for agitation, behavioral problems, self-injury, sleep disturbance and suicidal ideas. The patient is not nervous/anxious.   All other systems reviewed and are negative.   Per HPI unless specifically indicated above   Allergies as of 11/11/2020   No Known Allergies     Medication List       Accurate as of November 11, 2020  1:24 PM. If you have any questions, ask your nurse or doctor.        STOP taking these medications   citalopram 20 MG tablet Commonly known as: CELEXA Stopped by: [name] Shirlette Scarber, MD     TAKE these medications   ALPRAZolam 1 MG tablet Commonly known as: XANAX Take 1 mg by mouth 4 (four) times daily.   anastrozole 1 MG tablet Commonly known as: ARIMIDEX Take 1 tablet (1 mg total) by mouth daily.   docusate sodium 100 MG capsule Commonly known as: Colace Take 1 capsule (100 mg total) by mouth 2 (two) times daily.   enalapril 10 MG tablet Commonly known as: VASOTEC Take 1 tablet (10 mg total) by mouth daily. (Needs to be seen before next refill)   GOODY HEADACHE PO Take 1 packet by mouth every 12 (twelve) hours as needed (headache).   hydrochlorothiazide 25 MG tablet Commonly known as: HYDRODIURIL Take 1 tablet (25 mg total) by mouth daily.   loratadine 10 MG tablet Commonly known as: CLARITIN Take 10 mg by mouth daily.   mirtazapine 15 MG tablet Commonly known as: REMERON Take 15 mg by mouth at  bedtime as needed (sleep).   multivitamin with minerals Tabs tablet Take 1 tablet by mouth daily.   naproxen sodium 220 MG tablet Commonly known as: ALEVE Take 440 mg by mouth daily as needed (pain).   omeprazole 20 MG capsule Commonly known as: PRILOSEC Take 1 capsule (20 mg total) by mouth daily. (Needs to be seen before next refill)   ondansetron 4 MG tablet Commonly known as: Zofran Take 1 tablet (4 mg total) by mouth every 8 (eight) hours as needed for  nausea or vomiting.   propranolol 20 MG tablet Commonly known as: INDERAL Take 20 mg by mouth daily.   venlafaxine XR 37.5 MG 24 hr capsule Commonly known as: EFFEXOR-XR Take 1 capsule (37.5 mg total) by mouth daily with breakfast. Take 1 capsule for 1 week, then take 2 capsules daily.        Objective:   BP (!) 131/92   Pulse 93   Ht 5\' 6"  (1.676 m)   Wt 170 lb (77.1 kg)   SpO2 99%   BMI 27.44 kg/m   Wt Readings from Last 3 Encounters:  11/11/20 170 lb (77.1 kg)  08/24/20 170 lb 9.6 oz (77.4 kg)  05/24/20 173 lb 14.4 oz (78.9 kg)    Physical Exam Vitals and nursing note reviewed.  Constitutional:      General: She is not in acute distress.    Appearance: She is well-developed and well-nourished. She is not diaphoretic.  Eyes:     Extraocular Movements: EOM normal.     Conjunctiva/sclera: Conjunctivae normal.  Cardiovascular:     Rate and Rhythm: Normal rate and regular rhythm.     Pulses: Intact distal pulses.     Heart sounds: Normal heart sounds. No murmur heard.   Pulmonary:     Effort: Pulmonary effort is normal. No respiratory distress.     Breath sounds: Normal breath sounds. No wheezing.  Musculoskeletal:        General: No tenderness or edema. Normal range of motion.  Skin:    General: Skin is warm and dry.     Findings: No rash.  Neurological:     Mental Status: She is alert and oriented to person, place, and time.     Coordination: Coordination normal.  Psychiatric:        Mood and Affect: Mood and affect normal.        Behavior: Behavior normal.       Assessment & Plan:   Problem List Items Addressed This Visit      Cardiovascular and Mediastinum   Essential hypertension   Relevant Medications   enalapril (VASOTEC) 10 MG tablet   hydrochlorothiazide (HYDRODIURIL) 25 MG tablet    Other Visit Diagnoses    Varicose veins of both lower extremities with pain       Relevant Medications   enalapril (VASOTEC) 10 MG tablet    hydrochlorothiazide (HYDRODIURIL) 25 MG tablet   Peripheral edema       Relevant Medications   hydrochlorothiazide (HYDRODIURIL) 25 MG tablet      Continue current medication, is getting blood work done very frequently with oncology.  Patient is on the hormonal suppression for her breast cancer after she already finished her radiation. Follow up plan: Return in about 6 months (around 05/11/2021), or if symptoms worsen or fail to improve, for Hypertension recheck.  Counseling provided for all of the vaccine components No orders of the defined types were placed in this encounter.   Caryl Pina, MD  [place] 11/11/2020, 1:24 PM

## 2020-11-19 ENCOUNTER — Telehealth: Payer: Self-pay

## 2020-11-19 ENCOUNTER — Other Ambulatory Visit: Payer: Self-pay

## 2020-11-19 DIAGNOSIS — Z1211 Encounter for screening for malignant neoplasm of colon: Secondary | ICD-10-CM

## 2020-11-19 MED ORDER — FLUTICASONE PROPIONATE 50 MCG/ACT NA SUSP: 1.0000 | Freq: Two times a day (BID) | NASAL | 6 refills | Status: DC | PRN

## 2020-11-19 NOTE — Telephone Encounter (Signed)
Cologuard ordered

## 2020-11-19 NOTE — Telephone Encounter (Signed)
Sent Flonase for her now, please order the Cologuard for her

## 2020-12-09 DIAGNOSIS — F331 Major depressive disorder, recurrent, moderate: Secondary | ICD-10-CM | POA: Diagnosis not present

## 2020-12-20 ENCOUNTER — Ambulatory Visit (HOSPITAL_COMMUNITY): Payer: BC Managed Care – PPO

## 2020-12-23 ENCOUNTER — Other Ambulatory Visit: Payer: Self-pay

## 2020-12-23 ENCOUNTER — Inpatient Hospital Stay (HOSPITAL_COMMUNITY): Payer: Medicaid Other

## 2020-12-23 MED ORDER — LEUPROLIDE ACETATE (3 MONTH) 11.25 MG IM KIT
11.2500 mg | PACK | Freq: Once | INTRAMUSCULAR | Status: AC
  Filled 2020-12-23: qty 11.25

## 2020-12-30 ENCOUNTER — Encounter (HOSPITAL_COMMUNITY): Payer: Self-pay

## 2021-01-10 ENCOUNTER — Other Ambulatory Visit: Payer: Self-pay

## 2021-01-10 DIAGNOSIS — C50912 Malignant neoplasm of unspecified site of left female breast: Secondary | ICD-10-CM

## 2021-02-07 ENCOUNTER — Ambulatory Visit (HOSPITAL_COMMUNITY): Payer: BC Managed Care – PPO

## 2021-02-08 ENCOUNTER — Inpatient Hospital Stay (HOSPITAL_COMMUNITY): Payer: Medicaid Other | Attending: Hematology

## 2021-02-08 ENCOUNTER — Encounter (HOSPITAL_COMMUNITY): Payer: Medicaid Other

## 2021-02-08 ENCOUNTER — Encounter (HOSPITAL_COMMUNITY): Payer: BC Managed Care – PPO

## 2021-02-08 ENCOUNTER — Other Ambulatory Visit: Payer: Self-pay

## 2021-02-08 ENCOUNTER — Other Ambulatory Visit: Payer: Medicaid Other

## 2021-02-08 ENCOUNTER — Ambulatory Visit: Payer: Medicaid Other

## 2021-02-08 DIAGNOSIS — C50912 Malignant neoplasm of unspecified site of left female breast: Secondary | ICD-10-CM

## 2021-02-08 DIAGNOSIS — C50211 Malignant neoplasm of upper-inner quadrant of right female breast: Secondary | ICD-10-CM | POA: Insufficient documentation

## 2021-02-08 DIAGNOSIS — Z170 Estrogen receptor positive status [ER+]: Secondary | ICD-10-CM | POA: Insufficient documentation

## 2021-02-08 LAB — CBC WITH DIFFERENTIAL/PLATELET
Abs Immature Granulocytes: 0.01 10*3/uL (ref 0.00–0.07)
Basophils Absolute: 0 10*3/uL (ref 0.0–0.1)
Basophils Relative: 1 %
Eosinophils Absolute: 0.1 10*3/uL (ref 0.0–0.5)
Eosinophils Relative: 5 %
HCT: 38.6 % (ref 36.0–46.0)
Hemoglobin: 12.8 g/dL (ref 12.0–15.0)
Immature Granulocytes: 0 %
Lymphocytes Relative: 35 %
Lymphs Abs: 1.1 10*3/uL (ref 0.7–4.0)
MCH: 30.6 pg (ref 26.0–34.0)
MCHC: 33.2 g/dL (ref 30.0–36.0)
MCV: 92.3 fL (ref 80.0–100.0)
Monocytes Absolute: 0.3 10*3/uL (ref 0.1–1.0)
Monocytes Relative: 11 %
Neutro Abs: 1.5 10*3/uL — ABNORMAL LOW (ref 1.7–7.7)
Neutrophils Relative %: 48 %
Platelets: 268 10*3/uL (ref 150–400)
RBC: 4.18 MIL/uL (ref 3.87–5.11)
RDW: 13.4 % (ref 11.5–15.5)
WBC: 3.1 10*3/uL — ABNORMAL LOW (ref 4.0–10.5)
nRBC: 0 % (ref 0.0–0.2)

## 2021-02-08 LAB — COMPREHENSIVE METABOLIC PANEL
ALT: 24 U/L (ref 0–44)
AST: 21 U/L (ref 15–41)
Albumin: 3.7 g/dL (ref 3.5–5.0)
Alkaline Phosphatase: 69 U/L (ref 38–126)
Anion gap: 9 (ref 5–15)
BUN: 10 mg/dL (ref 6–20)
CO2: 26 mmol/L (ref 22–32)
Calcium: 9.4 mg/dL (ref 8.9–10.3)
Chloride: 100 mmol/L (ref 98–111)
Creatinine, Ser: 0.91 mg/dL (ref 0.44–1.00)
GFR, Estimated: >60 mL/min (ref >60)
Glucose, Bld: 104 mg/dL — ABNORMAL HIGH (ref 70–99)
Potassium: 3.7 mmol/L (ref 3.5–5.1)
Sodium: 135 mmol/L (ref 135–145)
Total Bilirubin: 0.8 mg/dL (ref 0.3–1.2)
Total Protein: 6.7 g/dL (ref 6.5–8.1)

## 2021-02-08 LAB — VITAMIN D 25 HYDROXY (VIT D DEFICIENCY, FRACTURES): Vit D, 25-Hydroxy: 32.57 ng/mL (ref 30–100)

## 2021-02-15 ENCOUNTER — Ambulatory Visit: Payer: Medicaid Other

## 2021-02-15 ENCOUNTER — Ambulatory Visit
Admission: RE | Admit: 2021-02-15 | Discharge: 2021-02-15 | Disposition: A | Discharge status: Home / Self Care | Payer: No Typology Code available for payment source | Source: Ambulatory Visit | Attending: Obstetrics and Gynecology | Admitting: Obstetrics and Gynecology

## 2021-02-15 ENCOUNTER — Other Ambulatory Visit: Payer: Self-pay

## 2021-02-15 ENCOUNTER — Ambulatory Visit: Payer: No Typology Code available for payment source | Admitting: *Deleted

## 2021-02-15 ENCOUNTER — Ambulatory Visit (HOSPITAL_COMMUNITY): Payer: BC Managed Care – PPO | Admitting: Hematology

## 2021-02-15 VITALS — BP 110/78 | Wt 171.4 lb

## 2021-02-15 DIAGNOSIS — Z1211 Encounter for screening for malignant neoplasm of colon: Secondary | ICD-10-CM

## 2021-02-15 DIAGNOSIS — C50912 Malignant neoplasm of unspecified site of left female breast: Secondary | ICD-10-CM

## 2021-02-15 DIAGNOSIS — N644 Mastodynia: Secondary | ICD-10-CM

## 2021-02-15 DIAGNOSIS — Z1239 Encounter for other screening for malignant neoplasm of breast: Secondary | ICD-10-CM

## 2021-02-15 HISTORY — DX: Personal history of irradiation: Z92.3

## 2021-02-15 NOTE — Patient Instructions (Signed)
Explained breast self awareness with [employer]. Unable to complete Pap smear in BCCCP due to patient has Medicaid Family Planning. Patient will call the [facility] Department to schedule her Pap smear. Also, let patient know about the free cervical cancer screenings offered by the [facility] if would like to schedule her Pap smear at one of the upcoming screenings. Referred patient to the [facility] for a diagnostic mammogram per recommendation. Appointment scheduled Tuesday, Feb 15, 2021 at 1240. Patient aware of appointment and will be there .Discussed smoking with patient. Referred to the [facility] Quitline and gave resources to the free smoking cessation classes at [facility]. Doris Newman verbalized understanding.  [place] Dalporto, Arvil Chaco, RN 12:26 PM

## 2021-02-15 NOTE — Progress Notes (Signed)
[name] is a 50 y.o. female who presents to [facility] clinic today with no complaints.    Pap Smear: Pap smear not completed today. Last Pap smear was 10/19/2017 at the [facility] Department clinic and was normal. Per patient has history of an abnormal Pap smear when she was 50 years old that a LEEP was completed for follow-up. Per patient all Pap smears have been normal since LEEP and she has had at least three. Last Pap smear result is not available in Epic.   Physical exam: Breasts Right breast larger than left breast due to patient has a history of breast cancer and lumpectomy in May 2021. No skin abnormalities right breast. Radiation skin changes observed left breast. No nipple retraction bilateral breasts. No nipple discharge bilateral breasts. No lymphadenopathy. No lumps palpated bilateral breasts. Complaints of right nipple area and left diffuse breast pain on exam.       Pelvic/Bimanual Unable to complete Pap smear in BCCCP due to patient has Medicaid Family Planning. Patient will call the [facility] Department to schedule her Pap smear.   Smoking History: Patient is a current smoker. Discussed smoking with patient. Referred to the [facility] Quitline and gave resources to the free smoking cessation classes at [facility].    [name]   Colorectal Cancer Screening: Per patient has never had colonoscopy completed. FIT Test given to patient to complete. No complaints today.    Breast and Cervical Cancer Risk Assessment: Patient has family history of her paternal grandmother having breast cancer. Patient has personal history of breast cancer. Patient has no known genetic mutations or history of radiation treatment to the chest before age 36. Per patient has history of cervical dysplasia. Patient has no history of being immunocompromised or DES exposure  in-utero.  Risk Assessment    Risk Scores      02/15/2021   Last edited by: Demetrius Revel, LPN   5-year risk: 1.8 %   Lifetime risk: 15.8 %          A: BCCCP exam without pap smear No complaints.  P: Referred patient to the [facility] for a diagnostic mammogram per recommendation. Appointment scheduled Tuesday, Feb 15, 2021 at 1240.  Loletta Parish, RN 02/15/2021 12:26 PM

## 2021-02-17 ENCOUNTER — Other Ambulatory Visit: Payer: Self-pay

## 2021-02-17 ENCOUNTER — Inpatient Hospital Stay (HOSPITAL_COMMUNITY): Payer: Self-pay | Attending: Hematology | Admitting: Hematology

## 2021-02-17 ENCOUNTER — Encounter (HOSPITAL_COMMUNITY): Payer: Self-pay | Admitting: Hematology

## 2021-02-17 VITALS — BP 105/74 | HR 84 | Temp 97.8°F | Resp 18 | Wt 168.9 lb

## 2021-02-17 DIAGNOSIS — Z79899 Other long term (current) drug therapy: Secondary | ICD-10-CM | POA: Insufficient documentation

## 2021-02-17 DIAGNOSIS — C50211 Malignant neoplasm of upper-inner quadrant of right female breast: Secondary | ICD-10-CM | POA: Insufficient documentation

## 2021-02-17 DIAGNOSIS — Z8042 Family history of malignant neoplasm of prostate: Secondary | ICD-10-CM | POA: Insufficient documentation

## 2021-02-17 DIAGNOSIS — R232 Flushing: Secondary | ICD-10-CM | POA: Insufficient documentation

## 2021-02-17 DIAGNOSIS — Z79811 Long term (current) use of aromatase inhibitors: Secondary | ICD-10-CM | POA: Insufficient documentation

## 2021-02-17 DIAGNOSIS — R5383 Other fatigue: Secondary | ICD-10-CM | POA: Insufficient documentation

## 2021-02-17 DIAGNOSIS — Z803 Family history of malignant neoplasm of breast: Secondary | ICD-10-CM | POA: Insufficient documentation

## 2021-02-17 DIAGNOSIS — Z170 Estrogen receptor positive status [ER+]: Secondary | ICD-10-CM | POA: Insufficient documentation

## 2021-02-17 DIAGNOSIS — D72819 Decreased white blood cell count, unspecified: Secondary | ICD-10-CM | POA: Insufficient documentation

## 2021-02-17 DIAGNOSIS — C50912 Malignant neoplasm of unspecified site of left female breast: Secondary | ICD-10-CM

## 2021-02-17 NOTE — Progress Notes (Signed)
[facility] [address], [place] [address]   Patient Care Team: [name], [name], MD as PCP - General (Family Medicine) Donetta Potts, RN as Oncology Nurse Navigator (Oncology) Derek Jack, MD as Medical Oncologist (Oncology)  SUMMARY OF ONCOLOGIC HISTORY: Oncology History  Invasive ductal carcinoma of left breast ([place])  02/19/2020 Initial Diagnosis   Invasive ductal carcinoma of left breast ([place])   04/06/2020 Genetic Testing   Oncotype DX Breast Recurrence Score Report        CHIEF COMPLIANT: Follow-up for invasive ductal carcinoma of left breast   INTERVAL HISTORY: [name] is a 50 y.o. female here today for follow up of her invasive ductal carcinoma of left breast. Her last visit was on 08/24/2020.   Today she is accompanied by her mother and she reports feeling okay. She continues having hot flashes which are tolerable. She was supposed to receive her Lupron in March but lost her job and her insurance, so she was not able to get it. She has stopped taking Celexa and only takes Effexor. She is taking Arimidex daily.  She will be starting a new job on 05/09 and will only receive insurance after 30 days.   REVIEW OF SYSTEMS:   Review of Systems  Constitutional: Positive for appetite change and fatigue.  Endocrine: Positive for hot flashes (on Effexor).  All other systems reviewed and are negative.   I have reviewed the past medical history, past surgical history, social history and family history with the patient and they are unchanged from previous note.   ALLERGIES:   has No Known Allergies.   MEDICATIONS:  Current Outpatient Medications  Medication Sig Dispense Refill  . ALPRAZolam (XANAX) 1 MG tablet Take 1 mg by mouth 4 (four) times daily.    Marland Kitchen anastrozole (ARIMIDEX) 1 MG tablet Take 1 tablet (1 mg total) by mouth daily. 90 tablet 3  . Aspirin-Acetaminophen-Caffeine (GOODY HEADACHE PO) Take 1 packet by mouth  every 12 (twelve) hours as needed (headache).     . docusate sodium (COLACE) 100 MG capsule Take 1 capsule (100 mg total) by mouth 2 (two) times daily. 60 capsule 2  . enalapril (VASOTEC) 10 MG tablet Take 1 tablet (10 mg total) by mouth daily. 90 tablet 3  . fluticasone (FLONASE) 50 MCG/ACT nasal spray Place 1 spray into both nostrils 2 (two) times daily as needed for allergies or rhinitis. 16 g 6  . hydrochlorothiazide (HYDRODIURIL) 25 MG tablet Take 1 tablet (25 mg total) by mouth daily. 90 tablet 3  . loratadine (CLARITIN) 10 MG tablet Take 10 mg by mouth daily.    . mirtazapine (REMERON) 15 MG tablet Take 15 mg by mouth at bedtime as needed (sleep).     . Multiple Vitamin (MULTIVITAMIN WITH MINERALS) TABS tablet Take 1 tablet by mouth daily.    . naproxen sodium (ALEVE) 220 MG tablet Take 440 mg by mouth daily as needed (pain).     Marland Kitchen omeprazole (PRILOSEC) 20 MG capsule Take 1 capsule (20 mg total) by mouth daily. 90 capsule 3  . ondansetron (ZOFRAN) 4 MG tablet Take 1 tablet (4 mg total) by mouth every 8 (eight) hours as needed for nausea or vomiting. 30 tablet 1  . propranolol (INDERAL) 20 MG tablet Take 20 mg by mouth daily.     Marland Kitchen venlafaxine XR (EFFEXOR-XR) 37.5 MG 24 hr capsule Take 1 capsule (37.5 mg total) by mouth daily with breakfast. Take 1 capsule for 1  week, then take 2 capsules daily. 60 capsule 3   No current facility-administered medications for this visit.   Facility-Administered Medications Ordered in Other Visits  Medication Dose Route Frequency Provider Last Rate Last Admin  . leuprolide (LUPRON) injection 11.25 mg  11.25 mg Intramuscular Once Derek Jack, MD         PHYSICAL EXAMINATION: Performance status (ECOG): 0 - Asymptomatic  Vitals:   02/17/21 1452  BP: 105/74  Pulse: 84  Resp: 18  Temp: 97.8 F (36.6 C)  SpO2: 97%   Wt Readings from Last 3 Encounters:  02/17/21 168 lb 14.4 oz (76.6 kg)  02/15/21 171 lb 6.4 oz (77.7 kg)  11/11/20 170 lb  (77.1 kg)   Physical Exam Vitals reviewed.  Constitutional:      Appearance: Normal appearance.  Cardiovascular:     Rate and Rhythm: Normal rate and regular rhythm.     Pulses: Normal pulses.     Heart sounds: Normal heart sounds.  Pulmonary:     Effort: Pulmonary effort is normal.     Breath sounds: Normal breath sounds.  Chest:  Breasts:     Right: Normal. No swelling, bleeding, inverted nipple, mass, nipple discharge, skin change, tenderness, axillary adenopathy or supraclavicular adenopathy.     Left: Swelling and tenderness (sore throughout) present. No bleeding, inverted nipple, mass, nipple discharge, skin change (thickened incisional scar), axillary adenopathy or supraclavicular adenopathy.    Abdominal:     Palpations: Abdomen is soft. There is no hepatomegaly, splenomegaly or mass.     Tenderness: There is no abdominal tenderness.     Hernia: No hernia is present.  Musculoskeletal:     Right lower leg: No edema.     Left lower leg: No edema.  Lymphadenopathy:     Upper Body:     Right upper body: No supraclavicular, axillary or pectoral adenopathy.     Left upper body: No supraclavicular, axillary or pectoral adenopathy.  Neurological:     General: No focal deficit present.     Mental Status: She is alert and oriented to person, place, and time.  Psychiatric:        Mood and Affect: Mood normal.        Behavior: Behavior normal.     Breast Exam Chaperone: Milinda Antis, MD     LABORATORY DATA:  I have reviewed the data as listed CMP Latest Ref Rng & Units 02/08/2021 08/19/2020 03/05/2020  Glucose 70 - 99 mg/dL 104(H) 94 89  BUN 6 - 20 mg/dL _0 Creatinine 0.44 - 1.00 mg/dL 0.91 1.05(H) 0.78  Sodium 135 - 145 mmol/L 135 136 128(L)  Potassium 3.5 - 5.1 mmol/L 3.7 4.0 3.4(L)  Chloride 98 - 111 mmol/L 100 99 89(L)  CO2 22 - 32 mmol/L _1 Calcium 8.9 - 10.3 mg/dL 9.4 9.6 8.8(L)  Total Protein 6.5 - 8.1 g/dL 6.7 8.0 -  Total Bilirubin 0.3 - 1.2  mg/dL 0.8 0.8 -  Alkaline Phos 38 - 126 U/L 69 94 -  AST 15 - 41 U/L 21 35 -  ALT 0 - 44 U/L 24 37 -   No results found for: [id number] Lab Results  Component Value Date   WBC 3.1 (L) 02/08/2021   HGB 12.8 02/08/2021   HCT 38.6 02/08/2021   MCV 92.3 02/08/2021   PLT 268 02/08/2021   NEUTROABS 1.5 (L) 02/08/2021   Lab Results  Component Value Date   VD25OH 32.57 02/08/2021   VD25OH  35.29 08/19/2020   VD25OH 22.2 (L) 12/06/2015    ASSESSMENT:  1. Stage Ib (T2N0) left breast IDC: -Right breast lump was palp[facility] at [facility] department. [facility] 1.3 x 2.1 cm irregular hypoechoic mass at the 2 o'clock position 3 to 4 cm from nipple. -Ultrasound-guided biopsy on 02/04/2020 showed invasive ductal carcinoma, grade 2. -Left lumpectomy after needle localization and SLNB by Dr. Constance Haw on 03/08/2020. -Pathology-2.1 cm IDC, grade 1, 0/3 lymph nodes positive, PT 2 PN 0, ER 95%, PR to 70%, HER-2 negative, Ki-67 10%. Invasive carcinoma is broadly less than 0.1 cm to the medial margin. Reexcision margins were negative. -Oncotype DX recurrence score 16, distant recurrence risk at 9 years with tamoxifen/AI of 4%, absolute chemotherapy benefit less than 1%. -XRT to the left breast started on 05/04/2020 and finished in August 2021. -Lupron started on 05/31/2020 along with anastrozole.  2. Family history: -Paternal grandmother had breast cancer. -Father had prostate cancer.   PLAN:  1. 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 - Last Lupron shot was on 09/21/2020.  She apparently lost her job and insurance and missed the injection. - She is joining a new job on Monday and will likely get insurance within a month.  We are also trying to get free drug for her if she qualifies and can get it sooner. - Physical examination today shows scar thickness in the left breast upper outer quadrant  lumpectomy area.  No palpable masses.  No palpable adenopathy. - Mammogram on 02/15/2021 was BI-RADS Category 2. - Reviewed labs from 02/08/2021 which showed normal chemistries.  Mild leukopenia with normal ANC.  We will closely monitor. - RTC 4 months for follow-up with repeat labs.  2. Family history: -We talked about genetic testing at prior visit.  She declined it.  3. Bone health: -Continue calcium and vitamin D.  Vitamin D level was 32. - Plan to repeat DEXA scan in 2 years.  4.  Hot flashes: -She was switched from Celexa to Effexor. - She is currently taking Effexor 37.5 mg daily which is helping with hot flashes.   Breast Cancer therapy associated bone loss: I have recommended calcium, Vitamin D and weight bearing exercises.  Orders placed this encounter:  Orders Placed This Encounter  Procedures  . VITAMIN D 25 Hydroxy (Vit-D Deficiency, Fractures)  . CBC with Differential/Platelet  . Comprehensive metabolic panel    The patient has a good understanding of the overall plan. She agrees with it. She will call with any problems that may develop before the next visit here.  Derek Jack, MD [place] [phone]   I, Milinda Antis, am acting as a scribe for Dr. Sanda Linger.  I, Derek Jack MD, have reviewed the above documentation for accuracy and completeness, and I agree with the above.

## 2021-02-17 NOTE — Patient Instructions (Signed)
[facility] at [facility] Discharge Instructions  You were seen today by Dr. Katragadda. He went over your recent results and scans. Dr. Katragadda will see you back in 4 months for labs and follow up.   Thank you for choosing [facility] at [facility] to provide your oncology and hematology care.  To afford each patient quality time with our provider, please arrive at least 15 minutes before your scheduled appointment time.   If you have a lab appointment with the Cancer Center please come in thru the Main Entrance and check in at the main information desk  You need to re-schedule your appointment should you arrive 10 or more minutes late.  We strive to give you quality time with our providers, and arriving late affects you and other patients whose appointments are after yours.  Also, if you no show three or more times for appointments you may be dismissed from the clinic at the providers discretion.     Again, thank you for choosing [facility].  Our hope is that these requests will decrease the amount of time that you wait before being seen by our physicians.       _____________________________________________________________  Should you have questions after your visit to [facility], please contact our office at [phone] between the hours of 8:00 a.m. and 4:30 p.m.  Voicemails left after 4:00 p.m. will not be returned until the following business day.  For prescription refill requests, have your pharmacy contact our office and allow 72 hours.    Cancer Center Support Programs:   > Cancer Support Group  2nd Tuesday of the month 1pm-2pm, Journey Room   

## 2021-02-18 ENCOUNTER — Encounter: Payer: Self-pay | Admitting: Hematology

## 2021-02-18 NOTE — Progress Notes (Signed)
Entered chart to review to answer question for Doris Newman in pharmacy received by email.  Referred her to Levada Dy for financial and Product manager.Marland Kitchen

## 2021-03-10 LAB — FECAL OCCULT BLOOD, IMMUNOCHEMICAL

## 2021-03-16 ENCOUNTER — Other Ambulatory Visit: Payer: Self-pay

## 2021-03-16 ENCOUNTER — Telehealth: Payer: Self-pay

## 2021-03-16 DIAGNOSIS — Z1211 Encounter for screening for malignant neoplasm of colon: Secondary | ICD-10-CM

## 2021-03-16 NOTE — Telephone Encounter (Signed)
Patient informed that FIT test specimen was received at the lab, but was unfortunately specimen was too old to process. Patient offered another FIT test, and agreed to do so. Fit test mailed to patient's verified address.

## 2021-03-30 ENCOUNTER — Telehealth (HOSPITAL_COMMUNITY): Payer: Self-pay | Admitting: *Deleted

## 2021-03-30 NOTE — Telephone Encounter (Signed)
Pt called into clinic stating her menstrual cycle was restarted and it was heavy. She stated she thought it was due to not having her Lupron shot since March. I spoke with Dr. Delton Coombes and he stated it could be possible for her menstrual cycle to restart because Lupron helps slow it down. Pt also stated she didn't have insurance to keep getting the shots. I spoke with Dr.K and he stated the pharmacy is going to contact the patient in a couple days  to let her know if pt she eligible to receive financial assistance to get the Lupron shots. I spoke pt and let her know what Dr. Raliegh Ip stated and she verbalized understanding.

## 2021-04-02 LAB — FECAL OCCULT BLOOD, IMMUNOCHEMICAL: Fecal Occult Bld: NEGATIVE

## 2021-04-06 ENCOUNTER — Telehealth: Payer: Self-pay

## 2021-04-06 NOTE — Telephone Encounter (Signed)
Spoke with patient to give FIT Test results. Informed patient that FIT test was negative. Patient voiced understanding.

## 2021-04-06 NOTE — Progress Notes (Signed)
Please call patient with FIT test result.

## 2021-04-14 ENCOUNTER — Other Ambulatory Visit (HOSPITAL_COMMUNITY): Payer: Self-pay

## 2021-04-15 ENCOUNTER — Other Ambulatory Visit (HOSPITAL_COMMUNITY): Payer: Self-pay

## 2021-04-15 ENCOUNTER — Encounter (HOSPITAL_COMMUNITY): Payer: Self-pay | Admitting: Hematology

## 2021-04-15 NOTE — Progress Notes (Signed)
An application for Lupron through [facility] was started on 12/23/20. [employer] requested additional info because the patient has an active Medicaid plan. The medicaid plan is family planning and I was asked to show documentation that [place] Medicaid does not cover the Lupron injection. I sent that documentation to foundation on 03/18/2021. I called [employer] to F/U and they then found another active plan. They gave me the info which was an [employer] plan- I called [employer] to see if the plan is active and it is not but they are unable to send me documentation because the plan is a Worker's Comp plan. I called the [facility] plan and they were willing to write something up for me. That documentation was sent to [facility] on 03/28/2021. I called to F/U on the application a week later and [facility] says the letter from the [facility] was not sufficient enough and it needed to come  from [employer]. I called [employer] again and they still say they are unable to give me any documentation showing that the plan was not active. During all of this I did find out that the patient will have a new [employer]BS plan effective on 05/16/2021. I advised patient  to send new info in as soon as she gets it so that we can start the Pre auth process  Madalyn Rob, CPhT IV Drug Replacement Specialist  [place] Phone: [phone]

## 2021-04-27 NOTE — Progress Notes (Signed)
The following Medication: Lupron has been approved thru [employer] as Assistance Program. Enrollment period is 04/25/2021 to 7/112023.  Assistance ID: [id number]. Reason for Assistance: Self First DOS: TBD  Madalyn Rob, CPhT IV Drug Replacement Specialist  [place] Phone: [phone]

## 2021-05-04 ENCOUNTER — Inpatient Hospital Stay (HOSPITAL_COMMUNITY): Payer: Self-pay | Attending: Hematology

## 2021-05-04 ENCOUNTER — Other Ambulatory Visit: Payer: Self-pay

## 2021-05-04 VITALS — BP 135/87 | HR 80 | Temp 97.0°F | Resp 18

## 2021-05-04 DIAGNOSIS — Z79818 Long term (current) use of other agents affecting estrogen receptors and estrogen levels: Secondary | ICD-10-CM | POA: Insufficient documentation

## 2021-05-04 DIAGNOSIS — Z170 Estrogen receptor positive status [ER+]: Secondary | ICD-10-CM | POA: Insufficient documentation

## 2021-05-04 DIAGNOSIS — C50912 Malignant neoplasm of unspecified site of left female breast: Secondary | ICD-10-CM

## 2021-05-04 DIAGNOSIS — C50211 Malignant neoplasm of upper-inner quadrant of right female breast: Secondary | ICD-10-CM | POA: Insufficient documentation

## 2021-05-04 DIAGNOSIS — C7951 Secondary malignant neoplasm of bone: Secondary | ICD-10-CM | POA: Insufficient documentation

## 2021-05-04 MED ORDER — LEUPROLIDE ACETATE (3 MONTH) 11.25 MG IM KIT
11.2500 mg | PACK | Freq: Once | INTRAMUSCULAR | Status: AC
  Administered 2021-05-04: 11.25 mg via INTRAMUSCULAR
  Filled 2021-05-04: qty 11.25

## 2021-05-04 NOTE — Progress Notes (Signed)
.  Doris Newman presents today for injection per the provider's orders.  Lupron administration without incident; injection site WNL; see MAR for injection details. Pt stable during and after treatment. Patient tolerated procedure well and without incident.  No questions or complaints noted at this time. Pt discharged ambulatory in satisfactory condition.

## 2021-05-04 NOTE — Patient Instructions (Signed)
[facility]  Discharge Instructions: Thank you for choosing [facility] to provide your oncology and hematology care.  If you have a lab appointment with the [facility], please come in thru the Main Entrance and check in at the main information desk.  Wear comfortable clothing and clothing appropriate for easy access to any Portacath or PICC line.   We strive to give you quality time with your provider. You may need to reschedule your appointment if you arrive late (15 or more minutes).  Arriving late affects you and other patients whose appointments are after yours.  Also, if you miss three or more appointments without notifying the office, you may be dismissed from the clinic at the provider's discretion.      For prescription refill requests, have your pharmacy contact our office and allow 72 hours for refills to be completed.    Today you received Lupron injection.    BELOW ARE SYMPTOMS THAT SHOULD BE REPORTED IMMEDIATELY: *FEVER GREATER THAN 100.4 F (38 C) OR HIGHER *CHILLS OR SWEATING *NAUSEA AND VOMITING THAT IS NOT CONTROLLED WITH YOUR NAUSEA MEDICATION *UNUSUAL SHORTNESS OF BREATH *UNUSUAL BRUISING OR BLEEDING *URINARY PROBLEMS (pain or burning when urinating, or frequent urination) *BOWEL PROBLEMS (unusual diarrhea, constipation, pain near the anus) TENDERNESS IN MOUTH AND THROAT WITH OR WITHOUT PRESENCE OF ULCERS (sore throat, sores in mouth, or a toothache) UNUSUAL RASH, SWELLING OR PAIN  UNUSUAL VAGINAL DISCHARGE OR ITCHING   Items with * indicate a potential emergency and should be followed up as soon as possible or go to the Emergency Department if any problems should occur.  Please show the CHEMOTHERAPY ALERT CARD or IMMUNOTHERAPY ALERT CARD at check-in to the Emergency Department and triage nurse.  Should you have questions after your visit or need to cancel or reschedule your appointment, please contact [facility]  [phone]  and follow the prompts.  Office hours are 8:00 a.m. to 4:30 p.m. Monday - Friday. Please note that voicemails left after 4:00 p.m. may not be returned until the following business day.  We are closed weekends and major holidays. You have access to a nurse at all times for urgent questions. Please call the main number to the clinic [phone] and follow the prompts.  For any non-urgent questions, you may also contact your provider using MyChart. We now offer e-Visits for anyone 39 and older to request care online for non-urgent symptoms. For details visit [email or web address].   Also download the MyChart app! Go to the app store, search "MyChart", open the app, select [facility], and log in with your MyChart username and password.  Due to Covid, a mask is required upon entering the hospital/clinic. If you do not have a mask, one will be given to you upon arrival. For doctor visits, patients may have 1 support person aged 79 or older with them. For treatment visits, patients cannot have anyone with them due to current Covid guidelines and our immunocompromised population.

## 2021-05-16 IMAGING — MG MM PLC BREAST LOC DEV 1ST LESION INC MAMMO GUIDE*L*
6 series · 6 of 6 positions shown · non-contrast
Comparison: 02/04/2020

CLINICAL DATA: LEFT breast cancer, for lump ectomy

EXAM:
NEEDLE LOCALIZATION OF THE LEFT BREAST WITH MAMMO GUIDANCE

[L CC (1 of 3)]
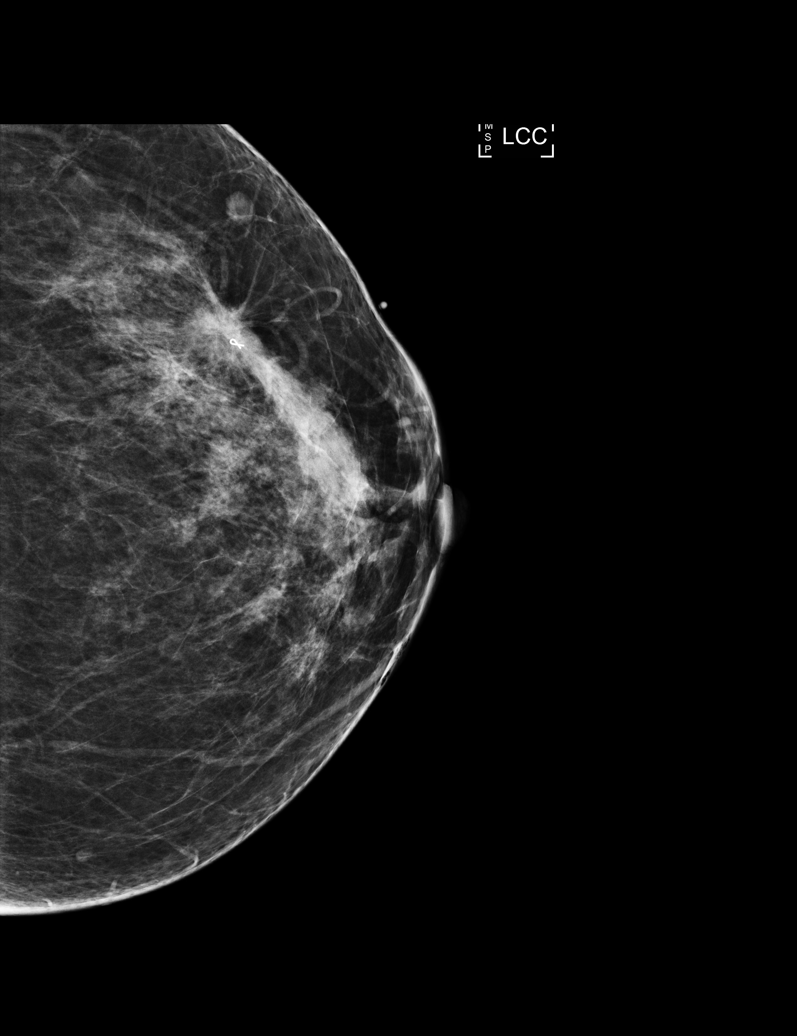

[L CC (2 of 3)]
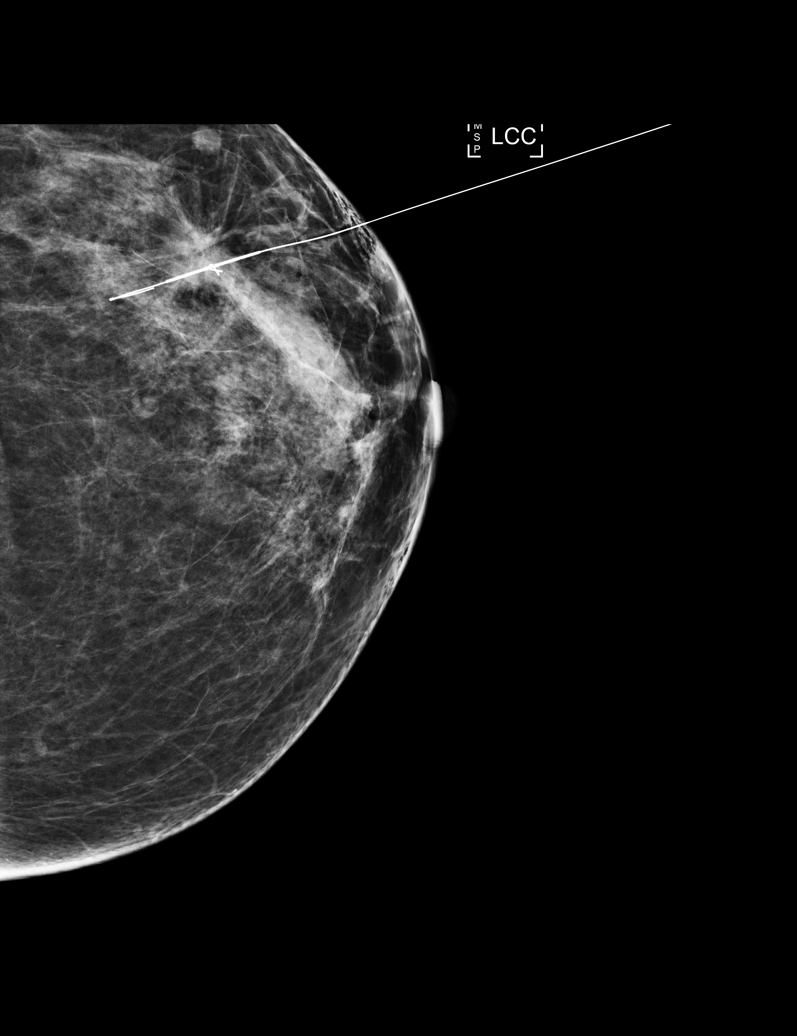

[L CC (3 of 3)]
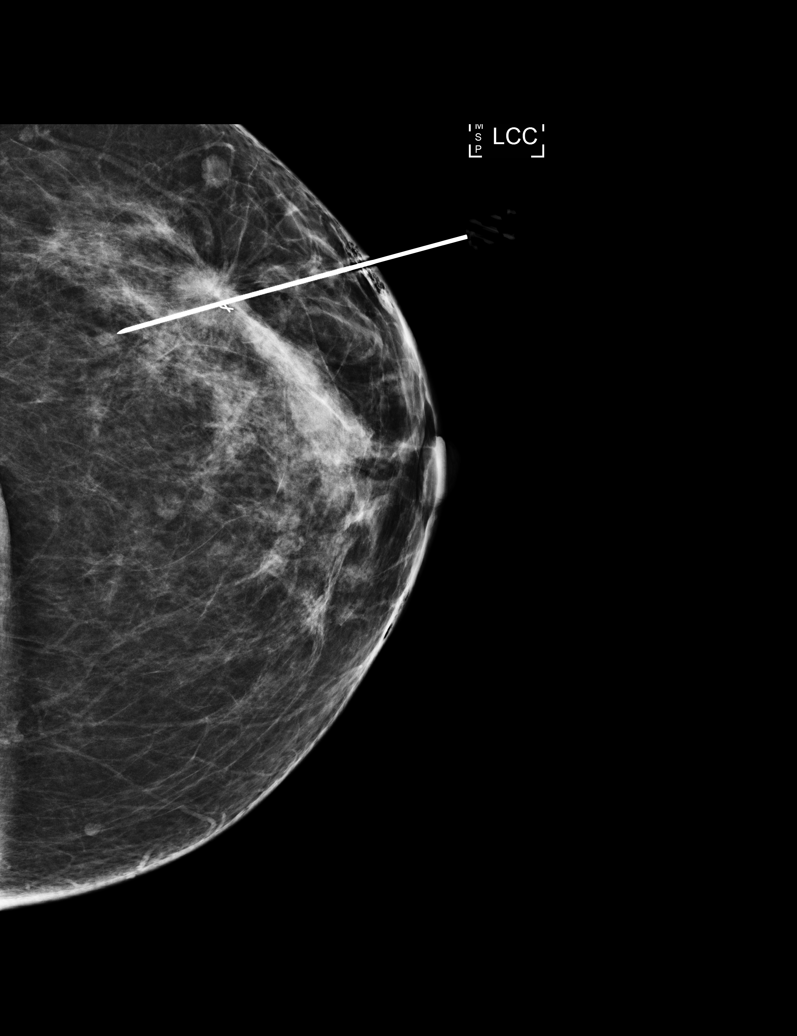

[L ML (1 of 3)]
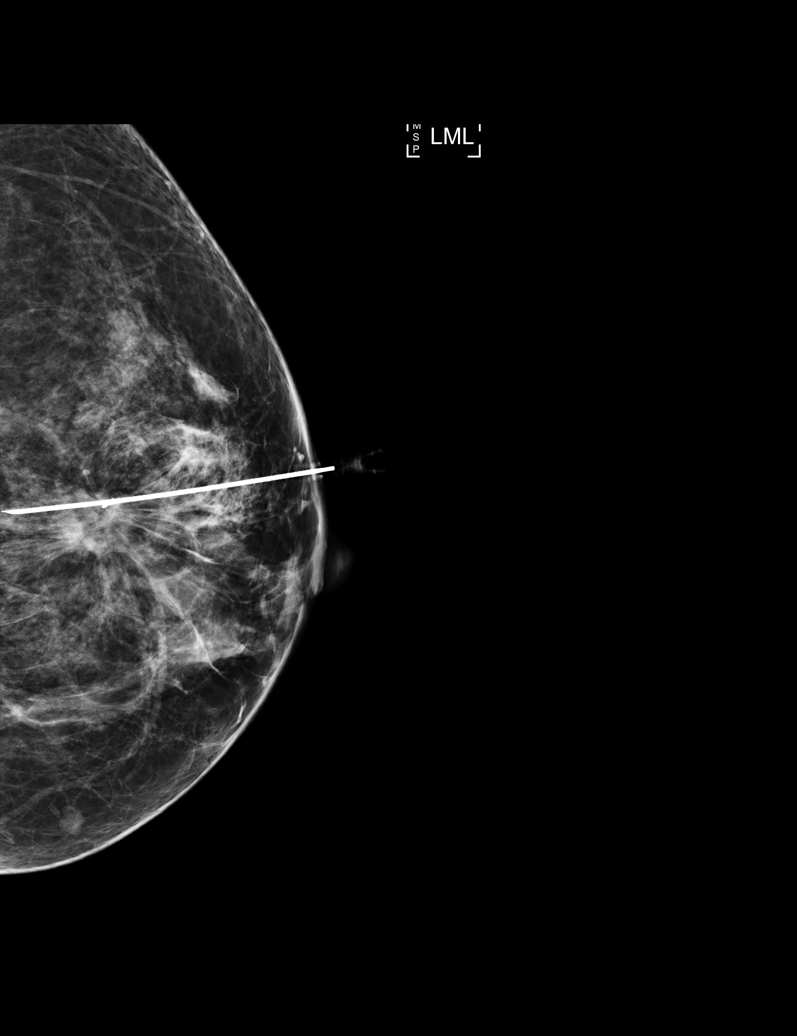

[L ML (2 of 3)]
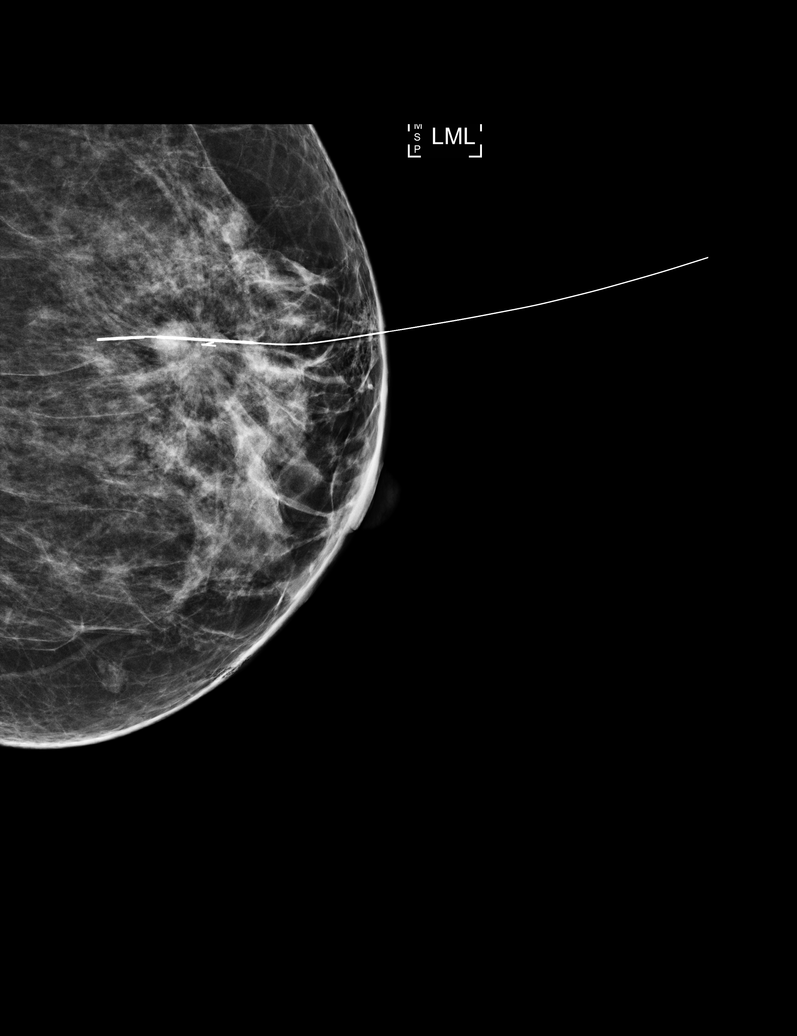

[L ML (3 of 3)]
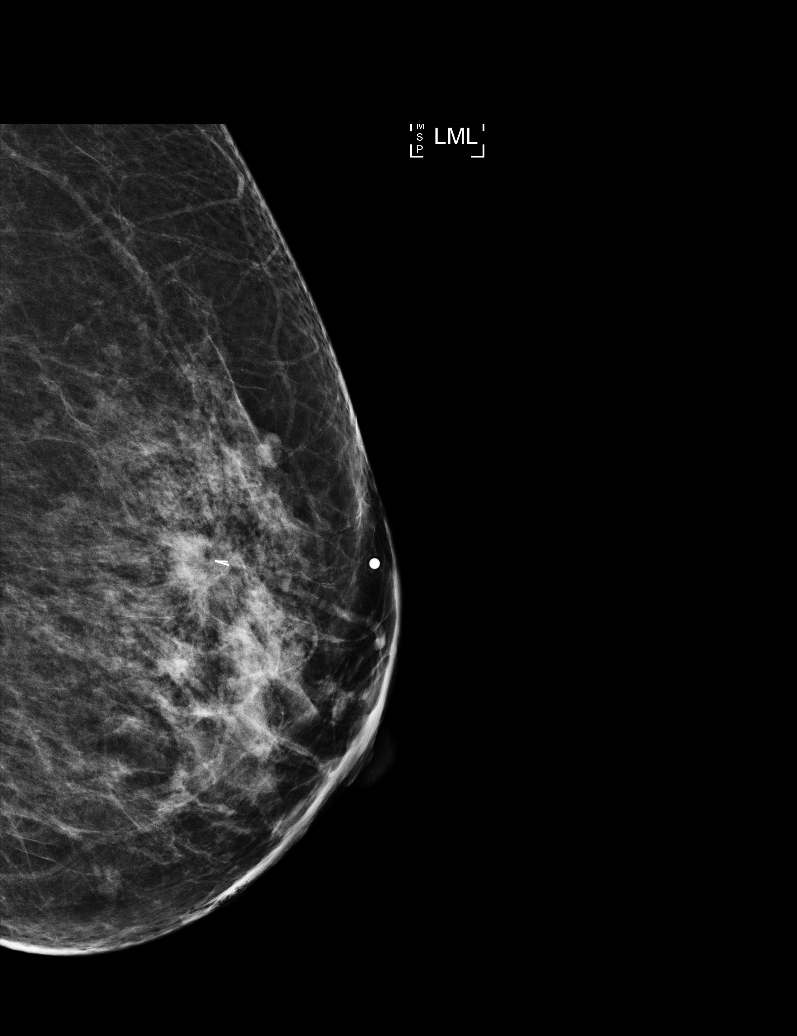

[6 of 6 positions shown; findings below may reference images not displayed]

PROCEDURE:
Patient presents for needle localization prior to lumpectomy. I met
with the patient and we discussed the procedure of needle
localization including benefits and alternatives. We discussed the
high likelihood of a successful procedure. We discussed the risks of
the procedure, including infection, bleeding, tissue injury, and
further surgery. Informed, written consent was given. The usual
time-out protocol was performed immediately prior to the procedure.

Using mammographic guidance, sterile technique, 1% lidocaine and a 7
cm modified Kopans needle, ribbon shaped surgical clip at the outer
mid LEFT breast localized using anterior free hand approach. The
images were marked for Dr. Joshjax.
IMPRESSION: Needle localization LEFT breast. No apparent complications.

## 2021-05-16 IMAGING — MG MM BREAST SURGICAL SPECIMEN
1 series · 1 of 1 positions shown · non-contrast
Comparison: Preoperative needle localization images of 03/08/2020

CLINICAL DATA: LEFT breast cancer

EXAM:
SPECIMEN RADIOGRAPH OF THE LEFT BREAST

[L SPECIMEN]
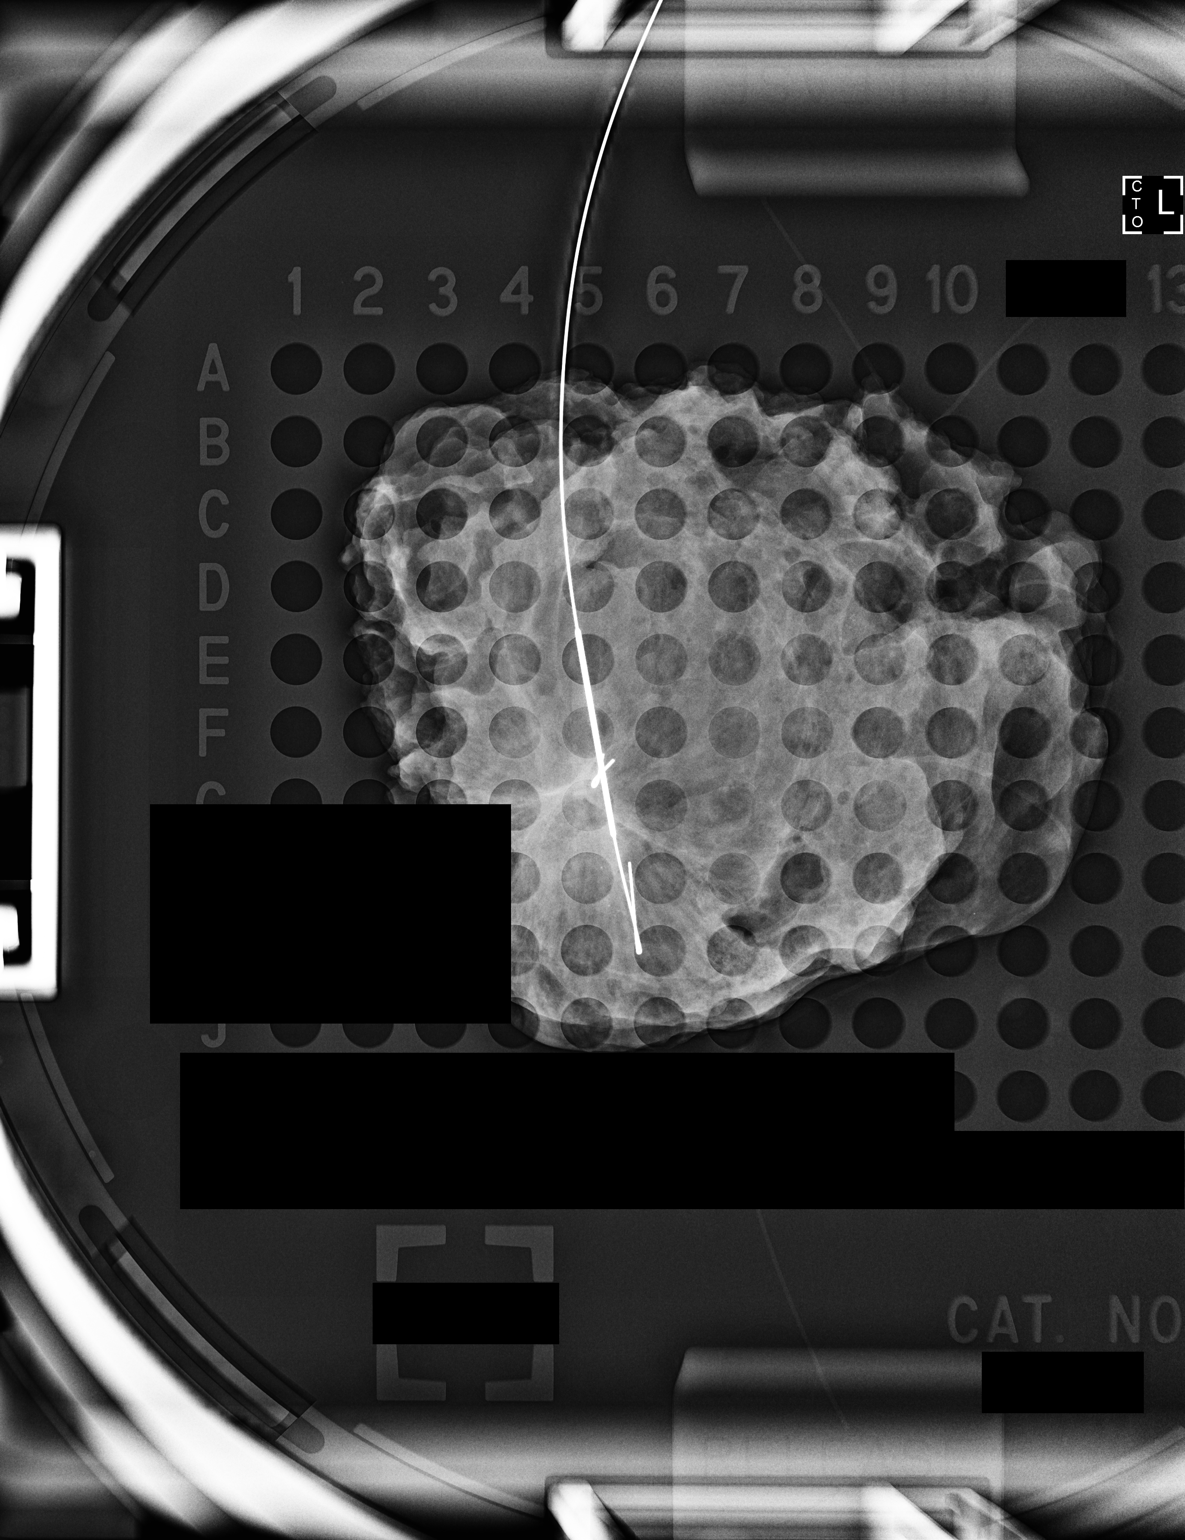

[1 of 1 positions shown; findings below may reference images not displayed]

FINDINGS: Status post excision of the LEFT breast.

The wire tip and biopsy marker clip are present and are marked for
pathology.
IMPRESSION: Specimen radiograph of the LEFT breast.

## 2021-05-26 ENCOUNTER — Ambulatory Visit: Payer: BC Managed Care – PPO | Admitting: Family Medicine

## 2021-06-01 ENCOUNTER — Encounter (HOSPITAL_COMMUNITY): Payer: Self-pay | Admitting: Hematology

## 2021-06-01 ENCOUNTER — Other Ambulatory Visit: Payer: Self-pay

## 2021-06-01 ENCOUNTER — Inpatient Hospital Stay (HOSPITAL_COMMUNITY): Payer: BC Managed Care – PPO | Attending: Hematology

## 2021-06-01 DIAGNOSIS — Z170 Estrogen receptor positive status [ER+]: Secondary | ICD-10-CM | POA: Insufficient documentation

## 2021-06-01 DIAGNOSIS — C50912 Malignant neoplasm of unspecified site of left female breast: Secondary | ICD-10-CM

## 2021-06-01 DIAGNOSIS — Z79899 Other long term (current) drug therapy: Secondary | ICD-10-CM | POA: Insufficient documentation

## 2021-06-01 DIAGNOSIS — R42 Dizziness and giddiness: Secondary | ICD-10-CM | POA: Diagnosis not present

## 2021-06-01 DIAGNOSIS — R232 Flushing: Secondary | ICD-10-CM | POA: Insufficient documentation

## 2021-06-01 DIAGNOSIS — R5383 Other fatigue: Secondary | ICD-10-CM | POA: Diagnosis not present

## 2021-06-01 DIAGNOSIS — Z79811 Long term (current) use of aromatase inhibitors: Secondary | ICD-10-CM | POA: Diagnosis not present

## 2021-06-01 LAB — COMPREHENSIVE METABOLIC PANEL
ALT: 26 U/L (ref 0–44)
AST: 27 U/L (ref 15–41)
Albumin: 3.8 g/dL (ref 3.5–5.0)
Alkaline Phosphatase: 76 U/L (ref 38–126)
Anion gap: 7 (ref 5–15)
BUN: 19 mg/dL (ref 6–20)
CO2: 27 mmol/L (ref 22–32)
Calcium: 9.2 mg/dL (ref 8.9–10.3)
Chloride: 99 mmol/L (ref 98–111)
Creatinine, Ser: 0.94 mg/dL (ref 0.44–1.00)
GFR, Estimated: >60 mL/min (ref >60)
Glucose, Bld: 95 mg/dL (ref 70–99)
Potassium: 4.1 mmol/L (ref 3.5–5.1)
Sodium: 133 mmol/L — ABNORMAL LOW (ref 135–145)
Total Bilirubin: 0.6 mg/dL (ref 0.3–1.2)
Total Protein: 6.8 g/dL (ref 6.5–8.1)

## 2021-06-01 LAB — CBC WITH DIFFERENTIAL/PLATELET
Abs Immature Granulocytes: 0.02 10*3/uL (ref 0.00–0.07)
Basophils Absolute: 0 10*3/uL (ref 0.0–0.1)
Basophils Relative: 1 %
Eosinophils Absolute: 0.2 10*3/uL (ref 0.0–0.5)
Eosinophils Relative: 4 %
HCT: 36.8 % (ref 36.0–46.0)
Hemoglobin: 12.2 g/dL (ref 12.0–15.0)
Immature Granulocytes: 1 %
Lymphocytes Relative: 32 %
Lymphs Abs: 1.4 10*3/uL (ref 0.7–4.0)
MCH: 31 pg (ref 26.0–34.0)
MCHC: 33.2 g/dL (ref 30.0–36.0)
MCV: 93.6 fL (ref 80.0–100.0)
Monocytes Absolute: 0.4 10*3/uL (ref 0.1–1.0)
Monocytes Relative: 10 %
Neutro Abs: 2.2 10*3/uL (ref 1.7–7.7)
Neutrophils Relative %: 52 %
Platelets: 258 10*3/uL (ref 150–400)
RBC: 3.93 MIL/uL (ref 3.87–5.11)
RDW: 13.5 % (ref 11.5–15.5)
WBC: 4.3 10*3/uL (ref 4.0–10.5)
nRBC: 0 % (ref 0.0–0.2)

## 2021-06-01 LAB — VITAMIN D 25 HYDROXY (VIT D DEFICIENCY, FRACTURES): Vit D, 25-Hydroxy: 41.37 ng/mL (ref 30–100)

## 2021-06-01 NOTE — Progress Notes (Signed)
[facility] [address], [place] [address]   Patient Care Team: [name], [name], MD as PCP - General (Family Medicine) Donetta Potts, RN as Oncology Nurse Navigator (Oncology) Derek Jack, MD as Medical Oncologist (Oncology)  SUMMARY OF ONCOLOGIC HISTORY: Oncology History  Invasive ductal carcinoma of left breast ([facility])  02/19/2020 Initial Diagnosis   Invasive ductal carcinoma of left breast ([facility])   04/06/2020 Genetic Testing   Oncotype DX Breast Recurrence Score Report        CHIEF COMPLIANT: Follow-up for invasive ductal carcinoma of left breast   INTERVAL HISTORY: [name] is a 50 y.o. female here today for follow up of her invasive ductal carcinoma of left breast. Her last visit was on 02/17/21.   Today she reports feeling well. She reports some shrinking of her left breast. She is currently taking anastrozole, and she reports hot flashes.   REVIEW OF SYSTEMS:   Review of Systems  Constitutional:  Positive for fatigue (50%). Negative for appetite change.  Endocrine: Positive for hot flashes.  Neurological:  Positive for dizziness and headaches.  Psychiatric/Behavioral:  Positive for depression. The patient is nervous/anxious.   All other systems reviewed and are negative.  I have reviewed the past medical history, past surgical history, social history and family history with the patient and they are unchanged from previous note.   ALLERGIES:   has No Known Allergies.   MEDICATIONS:  Current Outpatient Medications  Medication Sig Dispense Refill   ALPRAZolam (XANAX) 1 MG tablet Take 1 mg by mouth 4 (four) times daily.     anastrozole (ARIMIDEX) 1 MG tablet Take 1 tablet (1 mg total) by mouth daily. 90 tablet 3   Aspirin-Acetaminophen-Caffeine (GOODY HEADACHE PO) Take 1 packet by mouth every 12 (twelve) hours as needed (headache).      enalapril (VASOTEC) 10 MG tablet Take 1 tablet (10 mg total) by mouth daily. 90  tablet 3   fluticasone (FLONASE) 50 MCG/ACT nasal spray Place 1 spray into both nostrils 2 (two) times daily as needed for allergies or rhinitis. 16 g 6   hydrochlorothiazide (HYDRODIURIL) 25 MG tablet Take 1 tablet (25 mg total) by mouth daily. 90 tablet 3   loratadine (CLARITIN) 10 MG tablet Take 10 mg by mouth daily.     mirtazapine (REMERON) 15 MG tablet Take 15 mg by mouth at bedtime as needed (sleep).      Multiple Vitamin (MULTIVITAMIN WITH MINERALS) TABS tablet Take 1 tablet by mouth daily.     naproxen sodium (ALEVE) 220 MG tablet Take 440 mg by mouth daily as needed (pain).      omeprazole (PRILOSEC) 20 MG capsule Take 1 capsule (20 mg total) by mouth daily. 90 capsule 3   propranolol (INDERAL) 20 MG tablet Take 20 mg by mouth daily.      venlafaxine XR (EFFEXOR-XR) 37.5 MG 24 hr capsule Take 1 capsule (37.5 mg total) by mouth daily with breakfast. Take 1 capsule for 1 week, then take 2 capsules daily. 60 capsule 3   No current facility-administered medications for this visit.   Facility-Administered Medications Ordered in Other Visits  Medication Dose Route Frequency Provider Last Rate Last Admin   leuprolide (LUPRON) injection 11.25 mg  11.25 mg Intramuscular Once Derek Jack, MD         PHYSICAL EXAMINATION: Performance status (ECOG): 0 - Asymptomatic  There were no vitals filed for this visit. Wt Readings from Last 3 Encounters:  02/17/21 168  lb 14.4 oz (76.6 kg)  02/15/21 171 lb 6.4 oz (77.7 kg)  11/11/20 170 lb (77.1 kg)   Physical Exam Vitals reviewed.  Constitutional:      Appearance: Normal appearance.  Cardiovascular:     Rate and Rhythm: Normal rate and regular rhythm.     Pulses: Normal pulses.     Heart sounds: Normal heart sounds.  Pulmonary:     Effort: Pulmonary effort is normal.     Breath sounds: Normal breath sounds.  Chest:  Breasts:    Right: Normal. No skin change or tenderness.     Left: Skin change (UOQ stable scar thickness) and  tenderness present.  Abdominal:     Palpations: Abdomen is soft. There is no hepatomegaly, splenomegaly or mass.     Tenderness: There is no abdominal tenderness.  Musculoskeletal:     Right lower leg: No edema.     Left lower leg: No edema.  Lymphadenopathy:     Cervical: No cervical adenopathy.     Right cervical: No superficial cervical adenopathy.    Left cervical: No superficial cervical adenopathy.     Upper Body:     Right upper body: No supraclavicular, axillary or pectoral adenopathy.     Left upper body: No supraclavicular, axillary or pectoral adenopathy.  Neurological:     General: No focal deficit present.     Mental Status: She is alert and oriented to person, place, and time.  Psychiatric:        Mood and Affect: Mood normal.        Behavior: Behavior normal.    Breast Exam Chaperone: Thana Ates     LABORATORY DATA:  I have reviewed the data as listed CMP Latest Ref Rng & Units 06/01/2021 02/08/2021 08/19/2020  Glucose 70 - 99 mg/dL 95 104(H) 94  BUN 6 - 20 mg/dL _0 Creatinine 0.44 - 1.00 mg/dL 0.94 0.91 1.05(H)  Sodium 135 - 145 mmol/L 133(L) 135 136  Potassium 3.5 - 5.1 mmol/L 4.1 3.7 4.0  Chloride 98 - 111 mmol/L 99 100 99  CO2 22 - 32 mmol/L _1 Calcium 8.9 - 10.3 mg/dL 9.2 9.4 9.6  Total Protein 6.5 - 8.1 g/dL 6.8 6.7 8.0  Total Bilirubin 0.3 - 1.2 mg/dL 0.6 0.8 0.8  Alkaline Phos 38 - 126 U/L 76 69 94  AST 15 - 41 U/L 27 21 35  ALT 0 - 44 U/L 26 24 37   No results found for: [id number] Lab Results  Component Value Date   WBC 4.3 06/01/2021   HGB 12.2 06/01/2021   HCT 36.8 06/01/2021   MCV 93.6 06/01/2021   PLT 258 06/01/2021   NEUTROABS 2.2 06/01/2021    ASSESSMENT:  1.  Stage Ib (T2N0) left breast IDC: -Right breast lump was palpated at health department.  Bilateral mammogram and ultrasound on 02/04/2020 at [facility] showed 1.8 x 1.3 x 2.1 cm irregular hypoechoic mass at the 2 o'clock position 3 to 4 cm from nipple. -Ultrasound-guided  biopsy on 02/04/2020 showed invasive ductal carcinoma, grade 2. -Left lumpectomy after needle localization and SLNB by Dr. Constance Haw on 03/08/2020. -Pathology-2.1 cm IDC, grade 1, 0/3 lymph nodes positive, PT 2 PN 0, ER 95%, PR to 70%, HER-2 negative, Ki-67 10%.  Invasive carcinoma is broadly less than 0.1 cm to the medial margin.  Reexcision margins were negative. -Oncotype DX recurrence score 16, distant recurrence risk at 9 years with tamoxifen/AI of 4%, absolute chemotherapy benefit less  than 1%. -XRT to the left breast started on 05/04/2020 and finished in August 2021. -Lupron started on 05/31/2020 along with anastrozole.   2.  Family history: -Paternal grandmother had breast cancer. -Father had prostate cancer.     PLAN:  1.  Stage Ib left breast infiltrating ductal carcinoma: - Continue anastrozole daily. - Last Lupron injection 11.5 mg on 05/04/2021.  Prior to that she missed the injection because of problems with insurance. - Last mammogram was on 02/15/2021, BI-RADS Category 2. - Physical examination did not reveal any palpable masses in bilateral breast.  Left breast radiation changes seen.  No palpable adenopathy. - Reviewed labs from 06/01/2021 which showed normal LFTs and CBC. - Continue Lupron every 3 months.  RTC 4 months for follow-up.   2.  Family history: - She has declined genetic testing in the past.   3.  Bone health: - Continue calcium and vitamin D.  Vitamin D level is 41 normal. - Plan to repeat DEXA scan in 2 years.   4.  Hot flashes: - She is currently taking Effexor 75 mg daily.  Hot flashes reasonably controlled. - May titrate Effexor to 150 mg daily if needed.  Breast Cancer therapy associated bone loss: I have recommended calcium, Vitamin D and weight bearing exercises.  Orders placed this encounter:  No orders of the defined types were placed in this encounter.   The patient has a good understanding of the overall plan. She agrees with it. She will call  with any problems that may develop before the next visit here.  Derek Jack, MD [facility] [phone]   I, Thana Ates, am acting as a scribe for Dr. Derek Jack.  I, Derek Jack MD, have reviewed the above documentation for accuracy and completeness, and I agree with the above.

## 2021-06-02 ENCOUNTER — Inpatient Hospital Stay (HOSPITAL_COMMUNITY): Payer: BC Managed Care – PPO | Admitting: Hematology

## 2021-06-02 VITALS — BP 127/99 | HR 76 | Temp 98.2°F | Resp 16 | Wt 161.4 lb

## 2021-06-02 DIAGNOSIS — R5383 Other fatigue: Secondary | ICD-10-CM | POA: Diagnosis not present

## 2021-06-02 DIAGNOSIS — Z170 Estrogen receptor positive status [ER+]: Secondary | ICD-10-CM | POA: Diagnosis not present

## 2021-06-02 DIAGNOSIS — C50912 Malignant neoplasm of unspecified site of left female breast: Secondary | ICD-10-CM | POA: Diagnosis not present

## 2021-06-02 DIAGNOSIS — R42 Dizziness and giddiness: Secondary | ICD-10-CM | POA: Diagnosis not present

## 2021-06-02 DIAGNOSIS — Z79811 Long term (current) use of aromatase inhibitors: Secondary | ICD-10-CM | POA: Diagnosis not present

## 2021-06-02 DIAGNOSIS — R232 Flushing: Secondary | ICD-10-CM | POA: Diagnosis not present

## 2021-06-02 DIAGNOSIS — Z79899 Other long term (current) drug therapy: Secondary | ICD-10-CM | POA: Diagnosis not present

## 2021-06-02 NOTE — Patient Instructions (Addendum)
[facility] at [facility] °Discharge Instructions ° °You were seen today by Dr. Katragadda. He went over your recent results. Dr. Katragadda will see you back in 4 months for labs and follow up. ° ° °Thank you for choosing [facility] at [facility] to provide your oncology and hematology care.  To afford each patient quality time with our provider, please arrive at least 15 minutes before your scheduled appointment time.  ° °If you have a lab appointment with the Cancer Center please come in thru the Main Entrance and check in at the main information desk ° °You need to re-schedule your appointment should you arrive 10 or more minutes late.  We strive to give you quality time with our providers, and arriving late affects you and other patients whose appointments are after yours.  Also, if you no show three or more times for appointments you may be dismissed from the clinic at the providers discretion.     °Again, thank you for choosing [facility].  Our hope is that these requests will decrease the amount of time that you wait before being seen by our physicians.       °_____________________________________________________________ ° °Should you have questions after your visit to [facility], please contact our office at [phone] between the hours of 8:00 a.m. and 4:30 p.m.  Voicemails left after 4:00 p.m. will not be returned until the following business day.  For prescription refill requests, have your pharmacy contact our office and allow 72 hours.   ° °Cancer Center Support Programs:  ° °> Cancer Support Group  °2nd Tuesday of the month 1pm-2pm, Journey Room  ° ° °

## 2021-06-02 NOTE — Progress Notes (Signed)
Patient is taking anastrozole as prescribed.  They have not missed any doses and report no side effects at this time.   

## 2021-06-05 ENCOUNTER — Encounter (HOSPITAL_COMMUNITY): Payer: Self-pay | Admitting: Hematology

## 2021-06-06 DIAGNOSIS — F3342 Major depressive disorder, recurrent, in full remission: Secondary | ICD-10-CM | POA: Diagnosis not present

## 2021-06-07 ENCOUNTER — Other Ambulatory Visit: Payer: Self-pay | Admitting: *Deleted

## 2021-06-07 DIAGNOSIS — R609 Edema, unspecified: Secondary | ICD-10-CM

## 2021-06-07 DIAGNOSIS — I83813 Varicose veins of bilateral lower extremities with pain: Secondary | ICD-10-CM

## 2021-06-07 DIAGNOSIS — I10 Essential (primary) hypertension: Secondary | ICD-10-CM

## 2021-06-07 NOTE — Telephone Encounter (Signed)
[name] NTBS for 6 mos ckup. Mail order not sent

## 2021-06-10 ENCOUNTER — Other Ambulatory Visit (HOSPITAL_COMMUNITY): Payer: Self-pay

## 2021-06-10 DIAGNOSIS — C50912 Malignant neoplasm of unspecified site of left female breast: Secondary | ICD-10-CM

## 2021-06-14 ENCOUNTER — Encounter (HOSPITAL_COMMUNITY): Payer: Self-pay | Admitting: Hematology

## 2021-06-14 MED ORDER — ANASTROZOLE 1 MG PO TABS: 1.0000 mg | ORAL_TABLET | Freq: Every day | ORAL | 3 refills | Status: DC

## 2021-06-17 ENCOUNTER — Other Ambulatory Visit (HOSPITAL_COMMUNITY): Payer: No Typology Code available for payment source

## 2021-06-21 ENCOUNTER — Other Ambulatory Visit (HOSPITAL_COMMUNITY): Payer: No Typology Code available for payment source

## 2021-06-23 ENCOUNTER — Ambulatory Visit (HOSPITAL_COMMUNITY): Payer: No Typology Code available for payment source | Admitting: Hematology

## 2021-06-30 ENCOUNTER — Ambulatory Visit (HOSPITAL_COMMUNITY): Payer: No Typology Code available for payment source | Admitting: Hematology

## 2021-07-07 ENCOUNTER — Encounter (HOSPITAL_COMMUNITY): Payer: Self-pay | Admitting: Hematology

## 2021-07-17 DIAGNOSIS — R6883 Chills (without fever): Secondary | ICD-10-CM | POA: Diagnosis not present

## 2021-07-17 DIAGNOSIS — Z6826 Body mass index (BMI) 26.0-26.9, adult: Secondary | ICD-10-CM | POA: Diagnosis not present

## 2021-07-17 DIAGNOSIS — R110 Nausea: Secondary | ICD-10-CM | POA: Diagnosis not present

## 2021-07-17 DIAGNOSIS — R519 Headache, unspecified: Secondary | ICD-10-CM | POA: Diagnosis not present

## 2021-07-17 DIAGNOSIS — B349 Viral infection, unspecified: Secondary | ICD-10-CM | POA: Diagnosis not present

## 2021-07-17 DIAGNOSIS — I10 Essential (primary) hypertension: Secondary | ICD-10-CM | POA: Diagnosis not present

## 2021-07-17 DIAGNOSIS — K219 Gastro-esophageal reflux disease without esophagitis: Secondary | ICD-10-CM | POA: Diagnosis not present

## 2021-07-17 DIAGNOSIS — R52 Pain, unspecified: Secondary | ICD-10-CM | POA: Diagnosis not present

## 2021-07-28 ENCOUNTER — Inpatient Hospital Stay (HOSPITAL_COMMUNITY): Payer: BC Managed Care – PPO | Attending: Hematology

## 2021-07-28 VITALS — BP 121/88 | HR 77 | Temp 97.4°F | Resp 18

## 2021-07-28 DIAGNOSIS — Z79818 Long term (current) use of other agents affecting estrogen receptors and estrogen levels: Secondary | ICD-10-CM | POA: Diagnosis not present

## 2021-07-28 DIAGNOSIS — C50912 Malignant neoplasm of unspecified site of left female breast: Secondary | ICD-10-CM | POA: Insufficient documentation

## 2021-07-28 MED ORDER — LEUPROLIDE ACETATE (3 MONTH) 11.25 MG IM KIT
11.2500 mg | PACK | Freq: Once | INTRAMUSCULAR | Status: AC
  Administered 2021-07-28: 11.25 mg via INTRAMUSCULAR
  Filled 2021-07-28: qty 11.25

## 2021-07-28 NOTE — Progress Notes (Signed)
Doris Newman presents today for Lupron injection per the provider's orders.  Stable during administration without incident; injection site WNL; see MAR for injection details.  Patient tolerated procedure well and without incident.  No questions or complaints noted at this time. Discharge from clinic ambulatory in stable condition.  Alert and oriented X 3.  Follow up with [facility] as scheduled.

## 2021-07-28 NOTE — Patient Instructions (Signed)
[facility]  Discharge Instructions: Thank you for choosing [facility] to provide your oncology and hematology care.  If you have a lab appointment with the [facility], please come in thru the Main Entrance and check in at the main information desk.  Wear comfortable clothing and clothing appropriate for easy access to any Portacath or PICC line.   We strive to give you quality time with your provider. You may need to reschedule your appointment if you arrive late (15 or more minutes).  Arriving late affects you and other patients whose appointments are after yours.  Also, if you miss three or more appointments without notifying the office, you may be dismissed from the clinic at the provider's discretion.      For prescription refill requests, have your pharmacy contact our office and allow 72 hours for refills to be completed.    Today you received the following chemotherapy and/or immunotherapy agents Lupron      To help prevent nausea and vomiting after your treatment, we encourage you to take your nausea medication as directed.  BELOW ARE SYMPTOMS THAT SHOULD BE REPORTED IMMEDIATELY: *FEVER GREATER THAN 100.4 F (38 C) OR HIGHER *CHILLS OR SWEATING *NAUSEA AND VOMITING THAT IS NOT CONTROLLED WITH YOUR NAUSEA MEDICATION *UNUSUAL SHORTNESS OF BREATH *UNUSUAL BRUISING OR BLEEDING *URINARY PROBLEMS (pain or burning when urinating, or frequent urination) *BOWEL PROBLEMS (unusual diarrhea, constipation, pain near the anus) TENDERNESS IN MOUTH AND THROAT WITH OR WITHOUT PRESENCE OF ULCERS (sore throat, sores in mouth, or a toothache) UNUSUAL RASH, SWELLING OR PAIN  UNUSUAL VAGINAL DISCHARGE OR ITCHING   Items with * indicate a potential emergency and should be followed up as soon as possible or go to the Emergency Department if any problems should occur.  Please show the CHEMOTHERAPY ALERT CARD or IMMUNOTHERAPY ALERT CARD at check-in to the Emergency  Department and triage nurse.  Should you have questions after your visit or need to cancel or reschedule your appointment, please contact [facility] [address] [phone]  and follow the prompts.  Office hours are 8:00 a.m. to 4:30 p.m. Monday - Friday. Please note that voicemails left after 4:00 p.m. may not be returned until the following business day.  We are closed weekends and major holidays. You have access to a nurse at all times for urgent questions. Please call the main number to the clinic [phone] and follow the prompts.  For any non-urgent questions, you may also contact your provider using MyChart. We now offer e-Visits for anyone 35 and older to request care online for non-urgent symptoms. For details visit [email or web address].   Also download the MyChart app! Go to the app store, search "MyChart", open the app, select [facility], and log in with your MyChart username and password.  Due to Covid, a mask is required upon entering the hospital/clinic. If you do not have a mask, one will be given to you upon arrival. For doctor visits, patients may have 1 support person aged 40 or older with them. For treatment visits, patients cannot have anyone with them due to current Covid guidelines and our immunocompromised population.

## 2021-08-19 ENCOUNTER — Other Ambulatory Visit: Payer: Self-pay | Admitting: *Deleted

## 2021-08-19 NOTE — Telephone Encounter (Signed)
[name]. NTBS was to have 6 mos ckup in July. Mail order not sent

## 2021-08-19 NOTE — Telephone Encounter (Signed)
Appt made for 09/12/21

## 2021-08-25 ENCOUNTER — Other Ambulatory Visit: Payer: Self-pay | Admitting: *Deleted

## 2021-08-25 MED ORDER — OMEPRAZOLE 20 MG PO CPDR: 20.0000 mg | DELAYED_RELEASE_CAPSULE | Freq: Every day | ORAL | 0 refills | Status: DC

## 2021-08-25 MED ORDER — ENALAPRIL MALEATE 10 MG PO TABS: 10.0000 mg | ORAL_TABLET | Freq: Every day | ORAL | 0 refills | Status: DC

## 2021-09-12 ENCOUNTER — Encounter: Payer: Self-pay | Admitting: Family Medicine

## 2021-09-12 ENCOUNTER — Encounter (HOSPITAL_COMMUNITY): Payer: Self-pay | Admitting: Hematology

## 2021-09-12 ENCOUNTER — Ambulatory Visit: Payer: BC Managed Care – PPO | Admitting: Family Medicine

## 2021-09-12 VITALS — BP 130/93 | HR 74 | Ht 66.0 in | Wt 170.0 lb

## 2021-09-12 DIAGNOSIS — R609 Edema, unspecified: Secondary | ICD-10-CM | POA: Diagnosis not present

## 2021-09-12 DIAGNOSIS — I83813 Varicose veins of bilateral lower extremities with pain: Secondary | ICD-10-CM | POA: Diagnosis not present

## 2021-09-12 DIAGNOSIS — K219 Gastro-esophageal reflux disease without esophagitis: Secondary | ICD-10-CM | POA: Diagnosis not present

## 2021-09-12 DIAGNOSIS — I10 Essential (primary) hypertension: Secondary | ICD-10-CM

## 2021-09-12 DIAGNOSIS — Z23 Encounter for immunization: Secondary | ICD-10-CM

## 2021-09-12 MED ORDER — HYDROCHLOROTHIAZIDE 25 MG PO TABS: 25.0000 mg | ORAL_TABLET | Freq: Every day | ORAL | 3 refills | Status: DC

## 2021-09-12 MED ORDER — OMEPRAZOLE 20 MG PO CPDR
20.0000 mg | DELAYED_RELEASE_CAPSULE | Freq: Every day | ORAL | 3 refills | Status: DC
Start: 2021-09-12 — End: 2021-11-25

## 2021-09-12 MED ORDER — ENALAPRIL MALEATE 20 MG PO TABS: 20.0000 mg | ORAL_TABLET | Freq: Every day | ORAL | 3 refills | Status: DC

## 2021-09-12 NOTE — Progress Notes (Signed)
BP (!) 130/93   Pulse 74   Ht 5' 6" (1.676 m)   Wt 170 lb (77.1 kg)   SpO2 97%   BMI 27.44 kg/m    Subjective:   Patient ID: Doris Newman, female    DOB: 03/29/1971, 50 y.o.   MRN: [id number]  HPI: Doris Newman is a 50 y.o. female presenting on 09/12/2021 for Medical Management of Chronic Issues and Hypertension   HPI Hypertension and peripheral edema Patient is currently on enalapril and hydrochlorothiazide and propranolol for headaches, and their blood pressure today is 130/93. Patient denies any lightheadedness or dizziness. Patient denies chest pains, shortness of breath, or weakness. Denies any side effects from medication and is content with current medication.  She says she has been over the past few months getting more headaches and more blurred vision episodes when her blood pressure goes up.  She says her blood pressures been very high, recently she was at urgent care it was very elevated.  She also says she gets occasional blurred vision when it is elevated as well.  GERD Patient is currently on omeprazole.  She denies any major symptoms or abdominal pain or belching or burping. She denies any blood in her stool or lightheadedness or dizziness.   Relevant past medical, surgical, family and social history reviewed and updated as indicated. Interim medical history since our last visit reviewed. Allergies and medications reviewed and updated.  Review of Systems  Constitutional:  Negative for chills and fever.  Eyes:  Positive for visual disturbance.  Respiratory:  Negative for chest tightness and shortness of breath.   Cardiovascular:  Negative for chest pain, palpitations and leg swelling.  Genitourinary:  Negative for difficulty urinating and dysuria.  Musculoskeletal:  Negative for back pain and gait problem.  Skin:  Negative for rash.  Neurological:  Positive for headaches. Negative for dizziness and light-headedness.  Psychiatric/Behavioral:  Negative for  agitation and behavioral problems.   All other systems reviewed and are negative.  Per HPI unless specifically indicated above   Allergies as of 09/12/2021   No Known Allergies      Medication List        Accurate as of September 12, 2021 12:08 PM. If you have any questions, ask your nurse or doctor.          STOP taking these medications    GOODY HEADACHE PO Stopped by: Worthy Rancher, MD   loratadine 10 MG tablet Commonly known as: CLARITIN Stopped by: Worthy Rancher, MD   multivitamin with minerals Tabs tablet Stopped by: [name] Neeraj Housand, MD       TAKE these medications    ALPRAZolam 1 MG tablet Commonly known as: XANAX Take 1 mg by mouth 4 (four) times daily.   anastrozole 1 MG tablet Commonly known as: ARIMIDEX Take 1 tablet (1 mg total) by mouth daily.   enalapril 20 MG tablet Commonly known as: VASOTEC Take 1 tablet (20 mg total) by mouth daily. What changed:  medication strength how much to take Changed by: [name] Raygen Dahm, MD   fluticasone 50 MCG/ACT nasal spray Commonly known as: FLONASE Place 1 spray into both nostrils 2 (two) times daily as needed for allergies or rhinitis.   hydrochlorothiazide 25 MG tablet Commonly known as: HYDRODIURIL Take 1 tablet (25 mg total) by mouth daily.   mirtazapine 15 MG tablet Commonly known as: REMERON Take 15 mg by mouth at bedtime as needed (sleep).   naproxen sodium 220 MG tablet  Commonly known as: ALEVE Take 440 mg by mouth daily as needed (pain).   omeprazole 20 MG capsule Commonly known as: PRILOSEC Take 1 capsule (20 mg total) by mouth daily.   propranolol 20 MG tablet Commonly known as: INDERAL Take 20 mg by mouth daily.   venlafaxine XR 37.5 MG 24 hr capsule Commonly known as: EFFEXOR-XR Take 1 capsule (37.5 mg total) by mouth daily with breakfast. Take 1 capsule for 1 week, then take 2 capsules daily.         Objective:   BP (!) 130/93   Pulse 74   Ht 5' 6"  (1.676 m)   Wt 170 lb (77.1 kg)   SpO2 97%   BMI 27.44 kg/m   Wt Readings from Last 3 Encounters:  09/12/21 170 lb (77.1 kg)  06/02/21 161 lb 6.4 oz (73.2 kg)  02/17/21 168 lb 14.4 oz (76.6 kg)    Physical Exam Vitals and nursing note reviewed.  Constitutional:      General: She is not in acute distress.    Appearance: She is well-developed. She is not diaphoretic.  Eyes:     Extraocular Movements: Extraocular movements intact.     Conjunctiva/sclera: Conjunctivae normal.     Pupils: Pupils are equal, round, and reactive to light.  Cardiovascular:     Rate and Rhythm: Normal rate and regular rhythm.     Heart sounds: Normal heart sounds. No murmur heard. Pulmonary:     Effort: Pulmonary effort is normal. No respiratory distress.     Breath sounds: Normal breath sounds. No wheezing.  Musculoskeletal:        General: No swelling or tenderness. Normal range of motion.  Skin:    General: Skin is warm and dry.     Findings: No rash.  Neurological:     Mental Status: She is alert and oriented to person, place, and time.     Coordination: Coordination normal.  Psychiatric:        Behavior: Behavior normal.      Assessment & Plan:   Problem List Items Addressed This Visit       Cardiovascular and Mediastinum   Essential hypertension - Primary   Relevant Medications   enalapril (VASOTEC) 20 MG tablet   hydrochlorothiazide (HYDRODIURIL) 25 MG tablet   Other Relevant Orders   BMP8+EGFR     Digestive   Gastroesophageal reflux disease without esophagitis   Relevant Medications   omeprazole (PRILOSEC) 20 MG capsule   Other Visit Diagnoses     Varicose veins of both lower extremities with pain       Relevant Medications   enalapril (VASOTEC) 20 MG tablet   hydrochlorothiazide (HYDRODIURIL) 25 MG tablet   Peripheral edema       Relevant Medications   hydrochlorothiazide (HYDRODIURIL) 25 MG tablet     Increase enalapril to 20 mg, see if that improves on her  headaches and blood pressure.  Recheck kidney function in 2 weeks and see her back in 6 months.  Me know how her blood pressure runs over the next couple weeks and if it improves his symptoms.  Follow up plan: Return in about 6 months (around 03/12/2022), or if symptoms worsen or fail to improve, for htn.  Counseling provided for all of the vaccine components Orders Placed This Encounter  Procedures   BMP8+EGFR    Joshua [name], MD [facility] 09/12/2021, 12:08 PM     

## 2021-09-12 NOTE — Addendum Note (Signed)
Addended by: Alphonzo Dublin on: 09/12/2021 01:16 PM   Modules accepted: Orders

## 2021-09-30 ENCOUNTER — Encounter (HOSPITAL_COMMUNITY): Payer: Self-pay | Admitting: Hematology

## 2021-10-12 ENCOUNTER — Other Ambulatory Visit (HOSPITAL_COMMUNITY): Payer: Self-pay | Admitting: *Deleted

## 2021-10-12 DIAGNOSIS — C50912 Malignant neoplasm of unspecified site of left female breast: Secondary | ICD-10-CM

## 2021-10-13 ENCOUNTER — Inpatient Hospital Stay (HOSPITAL_COMMUNITY): Payer: BC Managed Care – PPO | Attending: Hematology

## 2021-10-13 ENCOUNTER — Other Ambulatory Visit: Payer: Self-pay

## 2021-10-13 DIAGNOSIS — N951 Menopausal and female climacteric states: Secondary | ICD-10-CM | POA: Diagnosis not present

## 2021-10-13 DIAGNOSIS — Z79811 Long term (current) use of aromatase inhibitors: Secondary | ICD-10-CM | POA: Diagnosis not present

## 2021-10-13 DIAGNOSIS — D0512 Intraductal carcinoma in situ of left breast: Secondary | ICD-10-CM | POA: Diagnosis not present

## 2021-10-13 DIAGNOSIS — Z79899 Other long term (current) drug therapy: Secondary | ICD-10-CM | POA: Insufficient documentation

## 2021-10-13 DIAGNOSIS — Z170 Estrogen receptor positive status [ER+]: Secondary | ICD-10-CM | POA: Diagnosis not present

## 2021-10-13 DIAGNOSIS — C50912 Malignant neoplasm of unspecified site of left female breast: Secondary | ICD-10-CM

## 2021-10-13 DIAGNOSIS — Z923 Personal history of irradiation: Secondary | ICD-10-CM | POA: Insufficient documentation

## 2021-10-13 LAB — CBC WITH DIFFERENTIAL/PLATELET
Abs Immature Granulocytes: 0.02 10*3/uL (ref 0.00–0.07)
Basophils Absolute: 0 10*3/uL (ref 0.0–0.1)
Basophils Relative: 1 %
Eosinophils Absolute: 0.2 10*3/uL (ref 0.0–0.5)
Eosinophils Relative: 5 %
HCT: 37.5 % (ref 36.0–46.0)
Hemoglobin: 12.6 g/dL (ref 12.0–15.0)
Immature Granulocytes: 1 %
Lymphocytes Relative: 31 %
Lymphs Abs: 1 10*3/uL (ref 0.7–4.0)
MCH: 31.8 pg (ref 26.0–34.0)
MCHC: 33.6 g/dL (ref 30.0–36.0)
MCV: 94.7 fL (ref 80.0–100.0)
Monocytes Absolute: 0.4 10*3/uL (ref 0.1–1.0)
Monocytes Relative: 13 %
Neutro Abs: 1.7 10*3/uL (ref 1.7–7.7)
Neutrophils Relative %: 49 %
Platelets: 243 10*3/uL (ref 150–400)
RBC: 3.96 MIL/uL (ref 3.87–5.11)
RDW: 13.3 % (ref 11.5–15.5)
WBC: 3.4 10*3/uL — ABNORMAL LOW (ref 4.0–10.5)
nRBC: 0 % (ref 0.0–0.2)

## 2021-10-13 LAB — COMPREHENSIVE METABOLIC PANEL
ALT: 18 U/L (ref 0–44)
AST: 18 U/L (ref 15–41)
Albumin: 3.8 g/dL (ref 3.5–5.0)
Alkaline Phosphatase: 68 U/L (ref 38–126)
Anion gap: 5 (ref 5–15)
BUN: 17 mg/dL (ref 6–20)
CO2: 29 mmol/L (ref 22–32)
Calcium: 9.1 mg/dL (ref 8.9–10.3)
Chloride: 102 mmol/L (ref 98–111)
Creatinine, Ser: 0.97 mg/dL (ref 0.44–1.00)
GFR, Estimated: >60 mL/min (ref >60)
Glucose, Bld: 90 mg/dL (ref 70–99)
Potassium: 4.1 mmol/L (ref 3.5–5.1)
Sodium: 136 mmol/L (ref 135–145)
Total Bilirubin: 0.4 mg/dL (ref 0.3–1.2)
Total Protein: 6.8 g/dL (ref 6.5–8.1)

## 2021-10-13 LAB — VITAMIN D 25 HYDROXY (VIT D DEFICIENCY, FRACTURES): Vit D, 25-Hydroxy: 42.35 ng/mL (ref 30–100)

## 2021-10-14 ENCOUNTER — Telehealth: Payer: Self-pay | Admitting: Family Medicine

## 2021-10-14 MED ORDER — FLUTICASONE PROPIONATE 50 MCG/ACT NA SUSP: 1.0000 | Freq: Two times a day (BID) | NASAL | 1 refills | Status: DC | PRN

## 2021-10-14 NOTE — Telephone Encounter (Signed)
Pt aware refill sent to pharmacy Pt had labwork done for cancer Dr yesterday

## 2021-10-14 NOTE — Telephone Encounter (Signed)
°  Prescription Request  10/14/2021  Is this a "Controlled Substance" medicine? no  Have you seen your PCP in the last 2 weeks? no  If YES, route message to pool  -  If NO, patient needs to be scheduled for appointment.  What is the name of the medication or equipment? fluticasone (FLONASE) 50 MCG/ACT nasal spray  Have you contacted your pharmacy to request a refill? yes   Which pharmacy would you like this sent to? [facility] ([place], [place] (Ph: [phone])   Patient notified that their request is being sent to the clinical staff for review and that they should receive a response within 2 business days.

## 2021-10-19 ENCOUNTER — Encounter (HOSPITAL_COMMUNITY): Payer: Self-pay | Admitting: Hematology

## 2021-10-19 NOTE — Progress Notes (Signed)
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 female here today for follow up of her invasive ductal carcinoma of left breast. Her last visit was on 06/02/2021.   Today she reports feeling fair. She reports hot flashes throughout the day and night. Her appetite is poor. She is taking calcium and vitamin D. She reports occasional swelling in her left leg.   REVIEW OF SYSTEMS:   Review of Systems  Constitutional:  Positive for appetite change and fatigue.  Cardiovascular:  Positive for leg swelling (L leg).  Endocrine: Positive for hot flashes.  All other systems reviewed and are negative.  I have reviewed the past medical history, past surgical history, social history and family history with the patient and they are unchanged from previous note.   ALLERGIES:   has No Known Allergies.   MEDICATIONS:  Current Outpatient Medications  Medication Sig Dispense Refill   ALPRAZolam (XANAX) 1 MG tablet Take 1 mg by mouth 4 (four) times daily.     anastrozole (ARIMIDEX) 1 MG tablet Take 1 tablet (1 mg total) by mouth daily. 90 tablet 3   enalapril (VASOTEC) 20 MG tablet Take 1 tablet (20 mg total) by mouth daily. 90 tablet 3   fluticasone (FLONASE) 50 MCG/ACT nasal spray Place 1 spray into both nostrils 2 (two) times daily as needed for allergies or rhinitis. 48 g 1   hydrochlorothiazide  (HYDRODIURIL) 25 MG tablet Take 1 tablet (25 mg total) by mouth daily. 90 tablet 3   mirtazapine (REMERON) 15 MG tablet Take 15 mg by mouth at bedtime as needed (sleep).      naproxen sodium (ALEVE) 220 MG tablet Take 440 mg by mouth daily as needed (pain).      omeprazole (PRILOSEC) 20 MG capsule Take 1 capsule (20 mg total) by mouth daily. 90 capsule 3   propranolol (INDERAL) 20 MG tablet Take 20 mg by mouth daily.      venlafaxine XR (EFFEXOR-XR) 37.5 MG 24 hr capsule Take 1 capsule (37.5 mg total) by mouth daily with breakfast. Take 1 capsule for 1 week, then take 2 capsules daily. 60 capsule 3   No current facility-administered medications for this visit.   Facility-Administered Medications Ordered in Other Visits  Medication Dose Route Frequency Provider Last Rate Last Admin   leuprolide (LUPRON) injection 11.25 mg  11.25 mg Intramuscular Once Derek Jack, MD         PHYSICAL EXAMINATION: Performance status (ECOG): 0 - Asymptomatic  Vitals:   10/20/21 1441  BP: 105/78  Pulse: 80  Resp: 16  Temp: 97.7 F (36.5 C)  SpO2: 96%   Wt Readings from Last 3 Encounters:  10/20/21 161 lb (73 kg)  09/12/21 170 lb (77.1 kg)  06/02/21 161 lb 6.4 oz (73.2 kg)   Physical Exam Vitals reviewed.  Constitutional:      Appearance: Normal appearance.  Cardiovascular:  Rate and Rhythm: Normal rate and regular rhythm.     Pulses: Normal pulses.     Heart sounds: Normal heart sounds.  Pulmonary:     Effort: Pulmonary effort is normal.     Breath sounds: Normal breath sounds.  Chest:  Breasts:    Right: Normal. No swelling, bleeding, mass, skin change or tenderness.     Left: Normal. No swelling, bleeding, mass, skin change (lumpectomy scar around lateral areole withing normal limits) or tenderness.  Abdominal:     Palpations: Abdomen is soft. There is no hepatomegaly, splenomegaly or mass.     Tenderness: There is no abdominal tenderness.  Musculoskeletal:     Right lower  leg: No edema.     Left lower leg: No edema.  Neurological:     General: No focal deficit present.     Mental Status: She is alert and oriented to person, place, and time.  Psychiatric:        Mood and Affect: Mood normal.        Behavior: Behavior normal.    Breast Exam Chaperone: Thana Ates     LABORATORY DATA:  I have reviewed the data as listed CMP Latest Ref Rng & Units 10/13/2021 06/01/2021 02/08/2021  Glucose 70 - 99 mg/dL 90 95 104(H)  BUN 6 - 20 mg/dL '17 19 10  ' Creatinine 0.44 - 1.00 mg/dL 0.97 0.94 0.91  Sodium 135 - 145 mmol/L 136 133(L) 135  Potassium 3.5 - 5.1 mmol/L 4.1 4.1 3.7  Chloride 98 - 111 mmol/L 102 99 100  CO2 22 - 32 mmol/L '29 27 26  ' Calcium 8.9 - 10.3 mg/dL 9.1 9.2 9.4  Total Protein 6.5 - 8.1 g/dL 6.8 6.8 6.7  Total Bilirubin 0.3 - 1.2 mg/dL 0.4 0.6 0.8  Alkaline Phos 38 - 126 U/L 68 76 69  AST 15 - 41 U/L '18 27 21  ' ALT 0 - 44 U/L '18 26 24   ' No results found for: NPY051 Lab Results  Component Value Date   WBC 3.4 (L) 10/13/2021   HGB 12.6 10/13/2021   HCT 37.5 10/13/2021   MCV 94.7 10/13/2021   PLT 243 10/13/2021   NEUTROABS 1.7 10/13/2021    ASSESSMENT:  1.  Stage Ib (T2N0) left breast IDC: -Right breast lump was [facility] at [facility].  Bilateral mamm[facility] and [facility] on 02/04/2020 at [facility] showed 1.8 x 1.3 x 2.1 cm irregular hypoechoic mass at the 2 o'clock position 3 to 4 cm from nipple. -Ultrasound-guided biopsy on 02/04/2020 showed invasive ductal carcinoma, grade 2. -Left lumpectomy after needle localization and SLNB by Dr. Constance Haw on 03/08/2020. -Pathology-2.1 cm IDC, grade 1, 0/3 lymph nodes positive, PT 2 PN 0, ER 95%, PR to 70%, HER-2 negative, Ki-67 10%.  Invasive carcinoma is broadly less than 0.1 cm to the medial margin.  Reexcision margins were negative. -Oncotype DX recurrence score 16, distant recurrence risk at 9 years with tamoxifen/AI of 4%, absolute chemotherapy benefit less than 1%. -XRT to the left breast started  on 05/04/2020 and finished in August 2021. -Lupron started on 05/31/2020 along with anastrozole.   2.  Family history: -Paternal grandmother had breast cancer. -Father had prostate cancer.   PLAN:  1.  Stage Ib left breast infiltrating ductal carcinoma: -Continue anastrozole.  She is having some hot flashes but is otherwise tolerating very well. - Physical examination today shows lumpectomy scar in the left outer areola, unchanged.  No palpable masses.  No palpable adenopathy. - Reviewed labs from  10/13/2021 which showed normal LFTs and CBC. - Recommend bilateral mammograms after 02/15/2022. - She will receive Lupron 11.25 every 3 months.  She will receive injection today. - Recommend RTC 4 months with labs.  We will plan to repeat mammogram prior to next visit.   2.  Family history: -She has declined genetic testing in the past.   3.  Bone health: -Continue calcium and vitamin D.  Vitamin D level is 42. - We will plan to do DEXA scan prior to next visit.   4.  Hot flashes: -She is currently taking Effexor 75 mg daily. - Continues to have some hot flashes. - I have told her to increase Effexor to 150 mg daily.  If it is helping her, we will send a prescription with higher dose.  Breast Cancer therapy associated bone loss: I have recommended calcium, Vitamin D and weight bearing exercises.  Orders placed this encounter:  No orders of the defined types were placed in this encounter.   The patient has a good understanding of the overall plan. She agrees with it. She will call with any problems that may develop before the next visit here.  Derek Jack, MD [facility] [phone]   I, Thana Ates, am acting as a scribe for Dr. Derek Jack.  I, Derek Jack MD, have reviewed the above documentation for accuracy and completeness, and I agree with the above.

## 2021-10-20 ENCOUNTER — Inpatient Hospital Stay (HOSPITAL_COMMUNITY): Payer: BC Managed Care – PPO | Attending: Hematology | Admitting: Hematology

## 2021-10-20 ENCOUNTER — Other Ambulatory Visit (HOSPITAL_COMMUNITY): Payer: Self-pay | Admitting: Hematology

## 2021-10-20 ENCOUNTER — Other Ambulatory Visit: Payer: Self-pay

## 2021-10-20 ENCOUNTER — Inpatient Hospital Stay (HOSPITAL_COMMUNITY): Payer: BC Managed Care – PPO

## 2021-10-20 VITALS — BP 105/78 | HR 80 | Temp 97.7°F | Resp 16 | Ht 66.0 in | Wt 161.0 lb

## 2021-10-20 DIAGNOSIS — Z79899 Other long term (current) drug therapy: Secondary | ICD-10-CM | POA: Diagnosis not present

## 2021-10-20 DIAGNOSIS — C50912 Malignant neoplasm of unspecified site of left female breast: Secondary | ICD-10-CM | POA: Diagnosis not present

## 2021-10-20 DIAGNOSIS — R232 Flushing: Secondary | ICD-10-CM | POA: Insufficient documentation

## 2021-10-20 DIAGNOSIS — Z79818 Long term (current) use of other agents affecting estrogen receptors and estrogen levels: Secondary | ICD-10-CM | POA: Insufficient documentation

## 2021-10-20 DIAGNOSIS — Z170 Estrogen receptor positive status [ER+]: Secondary | ICD-10-CM | POA: Diagnosis not present

## 2021-10-20 DIAGNOSIS — Z803 Family history of malignant neoplasm of breast: Secondary | ICD-10-CM | POA: Insufficient documentation

## 2021-10-20 MED ORDER — LEUPROLIDE ACETATE (3 MONTH) 11.25 MG IM KIT
11.2500 mg | PACK | Freq: Once | INTRAMUSCULAR | Status: AC
  Administered 2021-10-20: 11.25 mg via INTRAMUSCULAR
  Filled 2021-10-20: qty 11.25

## 2021-10-20 NOTE — Progress Notes (Signed)
Patient is taking Arimidex as prescribed.  She has not missed any doses and reports horrible hot flashes as side effects at this time.   Patient has been assessed, vital signs and labs have been reviewed by Dr. Delton Coombes. ANC, Creatinine, LFTs, and Platelets are within treatment parameters per Dr. Delton Coombes. The patient is good to proceed with Lupron treatment at this time.  Primary RN and pharmacy aware.

## 2021-10-20 NOTE — Progress Notes (Signed)
Doris Newman presents today for Lupron injection per the provider's orders.  Stable during administration without incident; injection site WNL; see MAR for injection details.  Patient tolerated procedure well and without incident.  No questions or complaints noted at this time. Discharge from clinic ambulatory in stable condition.  Alert and oriented X 3.  Follow up with [facility] as scheduled.

## 2021-10-20 NOTE — Patient Instructions (Signed)
[facility]  Discharge Instructions: Thank you for choosing [facility] to provide your oncology and hematology care.  If you have a lab appointment with the [facility], please come in thru the Main Entrance and check in at the main information desk.  Wear comfortable clothing and clothing appropriate for easy access to any Portacath or PICC line.   We strive to give you quality time with your provider. You may need to reschedule your appointment if you arrive late (15 or more minutes).  Arriving late affects you and other patients whose appointments are after yours.  Also, if you miss three or more appointments without notifying the office, you may be dismissed from the clinic at the providers discretion.      For prescription refill requests, have your pharmacy contact our office and allow 72 hours for refills to be completed.    Today you received the following chemotherapy and/or immunotherapy agents Lupron      To help prevent nausea and vomiting after your treatment, we encourage you to take your nausea medication as directed.  BELOW ARE SYMPTOMS THAT SHOULD BE REPORTED IMMEDIATELY: *FEVER GREATER THAN 100.4 F (38 C) OR HIGHER *CHILLS OR SWEATING *NAUSEA AND VOMITING THAT IS NOT CONTROLLED WITH YOUR NAUSEA MEDICATION *UNUSUAL SHORTNESS OF BREATH *UNUSUAL BRUISING OR BLEEDING *URINARY PROBLEMS (pain or burning when urinating, or frequent urination) *BOWEL PROBLEMS (unusual diarrhea, constipation, pain near the anus) TENDERNESS IN MOUTH AND THROAT WITH OR WITHOUT PRESENCE OF ULCERS (sore throat, sores in mouth, or a toothache) UNUSUAL RASH, SWELLING OR PAIN  UNUSUAL VAGINAL DISCHARGE OR ITCHING   Items with * indicate a potential emergency and should be followed up as soon as possible or go to the Emergency Department if any problems should occur.  Please show the CHEMOTHERAPY ALERT CARD or IMMUNOTHERAPY ALERT CARD at check-in to the Emergency  Department and triage nurse.  Should you have questions after your visit or need to cancel or reschedule your appointment, please contact [facility] [phone]  and follow the prompts.  Office hours are 8:00 a.m. to 4:30 p.m. Monday - Friday. Please note that voicemails left after 4:00 p.m. may not be returned until the following business day.  We are closed weekends and major holidays. You have access to a nurse at all times for urgent questions. Please call the main number to the clinic [phone] and follow the prompts.  For any non-urgent questions, you may also contact your provider using MyChart. We now offer e-Visits for anyone 65 and older to request care online for non-urgent symptoms. For details visit [email or web address].   Also download the MyChart app! Go to the app store, search "MyChart", open the app, select , and log in with your MyChart username and password.  Due to Covid, a mask is required upon entering the hospital/clinic. If you do not have a mask, one will be given to you upon arrival. For doctor visits, patients may have 1 support person aged 66 or older with them. For treatment visits, patients cannot have anyone with them due to current Covid guidelines and our immunocompromised population.

## 2021-10-20 NOTE — Patient Instructions (Signed)
[facility] at [facility] Discharge Instructions  You were seen and examined today by Dr. Delton Coombes. He reviewed your most recent labs and everything looks good. Continue taking Arimidex, Calcium and Vitamin D. Please keep follow up appointment as scheduled in 4 months.   Thank you for choosing [place] at [facility] to provide your oncology and hematology care.  To afford each patient quality time with our provider, please arrive at least 15 minutes before your scheduled appointment time.   If you have a lab appointment with the [facility] please come in thru the Main Entrance and check in at the main information desk.  You need to re-schedule your appointment should you arrive 10 or more minutes late.  We strive to give you quality time with our providers, and arriving late affects you and other patients whose appointments are after yours.  Also, if you no show three or more times for appointments you may be dismissed from the clinic at the providers discretion.     Again, thank you for choosing [facility].  Our hope is that these requests will decrease the amount of time that you wait before being seen by our physicians.       _____________________________________________________________  Should you have questions after your visit to [facility], please contact our office at [phone] and follow the prompts.  Our office hours are 8:00 a.m. and 4:30 p.m. Monday - Friday.  Please note that voicemails left after 4:00 p.m. may not be returned until the following business day.  We are closed weekends and major holidays.  You do have access to a nurse 24-7, just call the main number to the clinic [phone] and do not press any options, hold on the line and a nurse will answer the phone.    For prescription refill requests, have your pharmacy contact our office and allow 72 hours.    Due to Covid, you will need to  wear a mask upon entering the hospital. If you do not have a mask, a mask will be given to you at the Main Entrance upon arrival. For doctor visits, patients may have 1 support person age 46 or older with them. For treatment visits, patients can not have anyone with them due to social distancing guidelines and our immunocompromised population.

## 2021-11-14 ENCOUNTER — Encounter (HOSPITAL_COMMUNITY): Payer: Self-pay | Admitting: Hematology

## 2021-11-25 ENCOUNTER — Encounter: Payer: Self-pay | Admitting: Family Medicine

## 2021-11-25 ENCOUNTER — Ambulatory Visit: Payer: BC Managed Care – PPO | Admitting: Family Medicine

## 2021-11-25 VITALS — BP 114/79 | HR 76 | Ht 66.0 in | Wt 163.0 lb

## 2021-11-25 DIAGNOSIS — Z23 Encounter for immunization: Secondary | ICD-10-CM | POA: Diagnosis not present

## 2021-11-25 DIAGNOSIS — K219 Gastro-esophageal reflux disease without esophagitis: Secondary | ICD-10-CM | POA: Diagnosis not present

## 2021-11-25 DIAGNOSIS — Z1211 Encounter for screening for malignant neoplasm of colon: Secondary | ICD-10-CM | POA: Diagnosis not present

## 2021-11-25 DIAGNOSIS — I10 Essential (primary) hypertension: Secondary | ICD-10-CM

## 2021-11-25 DIAGNOSIS — E785 Hyperlipidemia, unspecified: Secondary | ICD-10-CM | POA: Insufficient documentation

## 2021-11-25 DIAGNOSIS — I83813 Varicose veins of bilateral lower extremities with pain: Secondary | ICD-10-CM

## 2021-11-25 DIAGNOSIS — E7800 Pure hypercholesterolemia, unspecified: Secondary | ICD-10-CM | POA: Diagnosis not present

## 2021-11-25 DIAGNOSIS — R609 Edema, unspecified: Secondary | ICD-10-CM

## 2021-11-25 LAB — LIPID PANEL

## 2021-11-25 MED ORDER — OMEPRAZOLE 20 MG PO CPDR: 20.0000 mg | DELAYED_RELEASE_CAPSULE | Freq: Every day | ORAL | 3 refills | Status: DC

## 2021-11-25 MED ORDER — HYDROCHLOROTHIAZIDE 25 MG PO TABS: 25.0000 mg | ORAL_TABLET | Freq: Every day | ORAL | 3 refills | Status: DC

## 2021-11-25 MED ORDER — ENALAPRIL MALEATE 20 MG PO TABS: 20.0000 mg | ORAL_TABLET | Freq: Every day | ORAL | 3 refills | Status: DC

## 2021-11-25 NOTE — Progress Notes (Signed)
BP 114/79    Pulse 76    Ht _0  (1.676 m)    Wt 163 lb (73.9 kg)    SpO2 99%    BMI 26.31 kg/m    Subjective:   Patient ID: Doris Newman, female    DOB: May 08, 1971, 51 y.o.   MRN: [id number]  HPI: Doris Newman is a 51 y.o. female presenting on 11/25/2021 for No chief complaint on file.   HPI Hypertension Patient is currently on hydrochlorothiazide and enalapril and propranolol, and their blood pressure today is 114/79. Patient denies any lightheadedness or dizziness. Patient denies headaches, blurred vision, chest pains, shortness of breath, or weakness. Denies any side effects from medication and is content with current medication.   Hyperlipidemia Patient is coming in for recheck of his hyperlipidemia. The patient is currently taking no medication currently. They deny any issues with myalgias or history of liver damage from it. They deny any focal numbness or weakness or chest pain.   GERD Patient is currently on omeprazole.  She denies any major symptoms or abdominal pain or belching or burping. She denies any blood in her stool or lightheadedness or dizziness.   Patient has history of breast cancer and is in the remission phase and is on Arimidex.  She follows up with them consistently.  Relevant past medical, surgical, family and social history reviewed and updated as indicated. Interim medical history since our last visit reviewed. Allergies and medications reviewed and updated.  Review of Systems  Constitutional:  Negative for chills and fever.  HENT:  Negative for congestion, ear discharge and ear pain.   Eyes:  Negative for redness and visual disturbance.  Respiratory:  Negative for chest tightness and shortness of breath.   Cardiovascular:  Negative for chest pain and leg swelling.  Genitourinary:  Negative for difficulty urinating and dysuria.  Musculoskeletal:  Negative for back pain and gait problem.  Skin:  Negative for rash.  Neurological:  Negative  for light-headedness and headaches.  Psychiatric/Behavioral:  Negative for agitation and behavioral problems.   All other systems reviewed and are negative.  Per HPI unless specifically indicated above   Allergies as of 11/25/2021   No Known Allergies      Medication List        Accurate as of November 25, 2021  9:07 AM. If you have any questions, ask your nurse or doctor.          ALPRAZolam 1 MG tablet Commonly known as: XANAX Take 1 mg by mouth 4 (four) times daily.   anastrozole 1 MG tablet Commonly known as: ARIMIDEX Take 1 tablet (1 mg total) by mouth daily.   enalapril 20 MG tablet Commonly known as: VASOTEC Take 1 tablet (20 mg total) by mouth daily.   fluticasone 50 MCG/ACT nasal spray Commonly known as: FLONASE Place 1 spray into both nostrils 2 (two) times daily as needed for allergies or rhinitis.   hydrochlorothiazide 25 MG tablet Commonly known as: HYDRODIURIL Take 1 tablet (25 mg total) by mouth daily.   mirtazapine 15 MG tablet Commonly known as: REMERON Take 15 mg by mouth at bedtime as needed (sleep).   naproxen sodium 220 MG tablet Commonly known as: ALEVE Take 440 mg by mouth daily as needed (pain).   omeprazole 20 MG capsule Commonly known as: PRILOSEC Take 1 capsule (20 mg total) by mouth daily.   propranolol 20 MG tablet Commonly known as: INDERAL Take 20 mg by mouth daily.   venlafaxine XR  37.5 MG 24 hr capsule Commonly known as: EFFEXOR-XR Take 1 capsule (37.5 mg total) by mouth daily with breakfast. Take 1 capsule for 1 week, then take 2 capsules daily.         Objective:   BP 114/79    Pulse 76    Ht _0  (1.676 m)    Wt 163 lb (73.9 kg)    SpO2 99%    BMI 26.31 kg/m   Wt Readings from Last 3 Encounters:  11/25/21 163 lb (73.9 kg)  10/20/21 161 lb (73 kg)  09/12/21 170 lb (77.1 kg)    Physical Exam Vitals and nursing note reviewed.  Constitutional:      General: She is not in acute distress.    Appearance: She  is well-developed. She is not diaphoretic.  Eyes:     Conjunctiva/sclera: Conjunctivae normal.  Cardiovascular:     Rate and Rhythm: Normal rate and regular rhythm.     Heart sounds: Normal heart sounds. No murmur heard. Pulmonary:     Effort: Pulmonary effort is normal. No respiratory distress.     Breath sounds: Normal breath sounds. No wheezing.  Musculoskeletal:        General: No swelling or tenderness. Normal range of motion.  Skin:    General: Skin is warm and dry.     Findings: No rash.  Neurological:     Mental Status: She is alert and oriented to person, place, and time.     Coordination: Coordination normal.  Psychiatric:        Behavior: Behavior normal.      Assessment & Plan:   Problem List Items Addressed This Visit       Cardiovascular and Mediastinum   Essential hypertension   Relevant Medications   hydrochlorothiazide (HYDRODIURIL) 25 MG tablet   enalapril (VASOTEC) 20 MG tablet   Other Relevant Orders   CBC with Differential/Platelet   CMP14+EGFR   Lipid panel     Digestive   Gastroesophageal reflux disease without esophagitis   Relevant Medications   omeprazole (PRILOSEC) 20 MG capsule   Other Relevant Orders   CBC with Differential/Platelet     Other   Hyperlipidemia   Relevant Medications   hydrochlorothiazide (HYDRODIURIL) 25 MG tablet   enalapril (VASOTEC) 20 MG tablet   Other Relevant Orders   CMP14+EGFR   Lipid panel   Other Visit Diagnoses     Colon cancer screening    -  Primary   Relevant Orders   Cologuard   Need for shingles vaccine       Relevant Orders   Varicella-zoster vaccine subcutaneous   Varicose veins of both lower extremities with pain       Relevant Medications   hydrochlorothiazide (HYDRODIURIL) 25 MG tablet   enalapril (VASOTEC) 20 MG tablet   Peripheral edema       Relevant Medications   hydrochlorothiazide (HYDRODIURIL) 25 MG tablet       Continue current medicine.  No changes.  We will check blood  work.  Patient will make sure she keeps up on her mammograms and Pap smears, will send Cologuard Follow up plan: Return in about 6 months (around 05/25/2022), or if symptoms worsen or fail to improve, for Hypertension and hyperlipidemia.  Counseling provided for all of the vaccine components Orders Placed This Encounter  Procedures   Varicella-zoster vaccine subcutaneous   CBC with Differential/Platelet   CMP14+EGFR   Lipid panel   Cologuard    Caryl Pina, MD Tristan Schroeder  [place] 11/25/2021, 9:07 AM

## 2021-11-26 LAB — CMP14+EGFR
ALT: 20 IU/L (ref 0–32)
AST: 21 IU/L (ref 0–40)
Albumin/Globulin Ratio: 2 (ref 1.2–2.2)
Albumin: 4.5 g/dL (ref 3.8–4.8)
Alkaline Phosphatase: 78 IU/L (ref 44–121)
BUN/Creatinine Ratio: 13 (ref 9–23)
BUN: 13 mg/dL (ref 6–24)
Bilirubin Total: 0.4 mg/dL (ref 0.0–1.2)
CO2: 26 mmol/L (ref 20–29)
Calcium: 9.8 mg/dL (ref 8.7–10.2)
Chloride: 97 mmol/L (ref 96–106)
Creatinine, Ser: 0.98 mg/dL (ref 0.57–1.00)
Globulin, Total: 2.2 g/dL (ref 1.5–4.5)
Glucose: 88 mg/dL (ref 70–99)
Potassium: 4.4 mmol/L (ref 3.5–5.2)
Sodium: 137 mmol/L (ref 134–144)
Total Protein: 6.7 g/dL (ref 6.0–8.5)
eGFR: 70 mL/min/{1.73_m2} (ref >59)

## 2021-11-26 LAB — CBC WITH DIFFERENTIAL/PLATELET
Basophils Absolute: 0 10*3/uL (ref 0.0–0.2)
Basos: 1 %
EOS (ABSOLUTE): 0.3 10*3/uL (ref 0.0–0.4)
Eos: 7 %
Hematocrit: 35.6 % (ref 34.0–46.6)
Hemoglobin: 12.2 g/dL (ref 11.1–15.9)
Immature Grans (Abs): 0 10*3/uL (ref 0.0–0.1)
Immature Granulocytes: 0 %
Lymphocytes Absolute: 1.8 10*3/uL (ref 0.7–3.1)
Lymphs: 41 %
MCH: 31 pg (ref 26.6–33.0)
MCHC: 34.3 g/dL (ref 31.5–35.7)
MCV: 90 fL (ref 79–97)
Monocytes Absolute: 0.4 10*3/uL (ref 0.1–0.9)
Monocytes: 9 %
Neutrophils Absolute: 1.9 10*3/uL (ref 1.4–7.0)
Neutrophils: 42 %
Platelets: 309 10*3/uL (ref 150–450)
RBC: 3.94 x10E6/uL (ref 3.77–5.28)
RDW: 13.1 % (ref 11.7–15.4)
WBC: 4.4 10*3/uL (ref 3.4–10.8)

## 2021-11-26 LAB — LIPID PANEL
Chol/HDL Ratio: 2.6 ratio (ref 0.0–4.4)
Cholesterol, Total: 204 mg/dL — ABNORMAL HIGH (ref 100–199)
HDL: 78 mg/dL (ref >39)
LDL Chol Calc (NIH): 114 mg/dL — ABNORMAL HIGH (ref 0–99)
Triglycerides: 69 mg/dL (ref 0–149)
VLDL Cholesterol Cal: 12 mg/dL (ref 5–40)

## 2021-12-07 ENCOUNTER — Encounter (HOSPITAL_COMMUNITY): Payer: Self-pay | Admitting: Hematology

## 2021-12-09 ENCOUNTER — Telehealth: Payer: Self-pay | Admitting: Family Medicine

## 2021-12-09 DIAGNOSIS — F3342 Major depressive disorder, recurrent, in full remission: Secondary | ICD-10-CM | POA: Diagnosis not present

## 2021-12-09 DIAGNOSIS — Z1211 Encounter for screening for malignant neoplasm of colon: Secondary | ICD-10-CM | POA: Diagnosis not present

## 2021-12-09 NOTE — Telephone Encounter (Signed)
NA, just keeps ringing

## 2021-12-13 NOTE — Telephone Encounter (Signed)
Labs from 11/25/21 mailed to pts home address today.

## 2021-12-14 ENCOUNTER — Telehealth: Payer: Self-pay | Admitting: Family Medicine

## 2021-12-14 NOTE — Telephone Encounter (Signed)
Left message asking pt to call back to make sure exactly what she wants copies of. We can fax to her employer if she wishes. Also informed pt that I mailed her a copy of her most recent lab results to her home address yesterday.  ?

## 2021-12-16 LAB — COLOGUARD: COLOGUARD: NEGATIVE

## 2021-12-23 ENCOUNTER — Encounter (HOSPITAL_COMMUNITY): Payer: Self-pay | Admitting: Hematology

## 2022-01-23 ENCOUNTER — Encounter (HOSPITAL_COMMUNITY): Payer: Self-pay

## 2022-01-26 ENCOUNTER — Encounter (HOSPITAL_COMMUNITY): Payer: Self-pay

## 2022-01-26 ENCOUNTER — Ambulatory Visit (HOSPITAL_COMMUNITY): Payer: BC Managed Care – PPO

## 2022-01-26 ENCOUNTER — Inpatient Hospital Stay (HOSPITAL_COMMUNITY): Payer: BC Managed Care – PPO | Attending: Hematology

## 2022-01-26 ENCOUNTER — Encounter (HOSPITAL_COMMUNITY): Payer: Self-pay | Admitting: Hematology

## 2022-01-26 VITALS — BP 112/82 | HR 76 | Temp 98.9°F | Resp 18

## 2022-01-26 DIAGNOSIS — Z79818 Long term (current) use of other agents affecting estrogen receptors and estrogen levels: Secondary | ICD-10-CM | POA: Diagnosis not present

## 2022-01-26 DIAGNOSIS — C50912 Malignant neoplasm of unspecified site of left female breast: Secondary | ICD-10-CM | POA: Insufficient documentation

## 2022-01-26 DIAGNOSIS — Z170 Estrogen receptor positive status [ER+]: Secondary | ICD-10-CM | POA: Diagnosis not present

## 2022-01-26 MED ORDER — LEUPROLIDE ACETATE (3 MONTH) 11.25 MG IM KIT
11.2500 mg | PACK | Freq: Once | INTRAMUSCULAR | Status: AC
  Administered 2022-01-26: 11.25 mg via INTRAMUSCULAR
  Filled 2022-01-26: qty 11.25

## 2022-01-26 NOTE — Patient Instructions (Signed)
[facility]  Discharge Instructions: ?Thank you for choosing [facility] to provide your oncology and hematology care.  ?If you have a lab appointment with the [facility], please come in thru the Main Entrance and check in at the main information desk. ? ?Wear comfortable clothing and clothing appropriate for easy access to any Portacath or PICC line.  ? ?We strive to give you quality time with your provider. You may need to reschedule your appointment if you arrive late (15 or more minutes).  Arriving late affects you and other patients whose appointments are after yours.  Also, if you miss three or more appointments without notifying the office, you may be dismissed from the clinic at the provider?s discretion.    ?  ?For prescription refill requests, have your pharmacy contact our office and allow 72 hours for refills to be completed.   ? ?Today you received the following Lupron, return as scheduled. ?  ?To help prevent nausea and vomiting after your treatment, we encourage you to take your nausea medication as directed. ? ?BELOW ARE SYMPTOMS THAT SHOULD BE REPORTED IMMEDIATELY: ?*FEVER GREATER THAN 100.4 F (38 ?C) OR HIGHER ?*CHILLS OR SWEATING ?*NAUSEA AND VOMITING THAT IS NOT CONTROLLED WITH YOUR NAUSEA MEDICATION ?*UNUSUAL SHORTNESS OF BREATH ?*UNUSUAL BRUISING OR BLEEDING ?*URINARY PROBLEMS (pain or burning when urinating, or frequent urination) ?*BOWEL PROBLEMS (unusual diarrhea, constipation, pain near the anus) ?TENDERNESS IN MOUTH AND THROAT WITH OR WITHOUT PRESENCE OF ULCERS (sore throat, sores in mouth, or a toothache) ?UNUSUAL RASH, SWELLING OR PAIN  ?UNUSUAL VAGINAL DISCHARGE OR ITCHING  ? ?Items with * indicate a potential emergency and should be followed up as soon as possible or go to the Emergency Department if any problems should occur. ? ?Please show the CHEMOTHERAPY ALERT CARD or IMMUNOTHERAPY ALERT CARD at check-in to the Emergency Department and triage  nurse. ? ?Should you have questions after your visit or need to cancel or reschedule your appointment, please contact [facility] [phone]  and follow the prompts.  Office hours are 8:00 a.m. to 4:30 p.m. Monday - Friday. Please note that voicemails left after 4:00 p.m. may not be returned until the following business day.  We are closed weekends and major holidays. You have access to a nurse at all times for urgent questions. Please call the main number to the clinic [phone] and follow the prompts. ? ?For any non-urgent questions, you may also contact your provider using MyChart. We now offer e-Visits for anyone 70 and older to request care online for non-urgent symptoms. For details visit [email or web address]. ?  ?Also download the MyChart app! Go to the app store, search "MyChart", open the app, select [facility], and log in with your MyChart username and password. ? ?Due to Covid, a mask is required upon entering the hospital/clinic. If you do not have a mask, one will be given to you upon arrival. For doctor visits, patients may have 1 support person aged 72 or older with them. For treatment visits, patients cannot have anyone with them due to current Covid guidelines and our immunocompromised population.  ?

## 2022-01-26 NOTE — Progress Notes (Signed)
Patient tolerated Lupron injection with no complaints voiced. Site clean and dry with no bruising or swelling noted at site. See MAR for details. Band aid applied.  Patient stable during and after injection. VSS with discharge and left in satisfactory condition with no s/s of distress noted.  ?

## 2022-02-21 ENCOUNTER — Other Ambulatory Visit (HOSPITAL_COMMUNITY): Payer: BC Managed Care – PPO

## 2022-02-21 ENCOUNTER — Encounter (HOSPITAL_COMMUNITY): Payer: BC Managed Care – PPO

## 2022-02-28 ENCOUNTER — Ambulatory Visit (HOSPITAL_COMMUNITY)
Admission: RE | Admit: 2022-02-28 | Discharge: 2022-02-28 | Disposition: A | Discharge status: Home / Self Care | Payer: BC Managed Care – PPO | Source: Ambulatory Visit | Attending: Hematology | Admitting: Hematology

## 2022-02-28 ENCOUNTER — Inpatient Hospital Stay (HOSPITAL_COMMUNITY): Payer: BC Managed Care – PPO | Attending: Hematology

## 2022-02-28 DIAGNOSIS — C50912 Malignant neoplasm of unspecified site of left female breast: Secondary | ICD-10-CM | POA: Insufficient documentation

## 2022-02-28 DIAGNOSIS — R922 Inconclusive mammogram: Secondary | ICD-10-CM | POA: Diagnosis not present

## 2022-02-28 DIAGNOSIS — R232 Flushing: Secondary | ICD-10-CM | POA: Diagnosis not present

## 2022-02-28 DIAGNOSIS — Z8042 Family history of malignant neoplasm of prostate: Secondary | ICD-10-CM | POA: Insufficient documentation

## 2022-02-28 DIAGNOSIS — Z780 Asymptomatic menopausal state: Secondary | ICD-10-CM | POA: Diagnosis not present

## 2022-02-28 DIAGNOSIS — Z803 Family history of malignant neoplasm of breast: Secondary | ICD-10-CM | POA: Insufficient documentation

## 2022-02-28 DIAGNOSIS — Z853 Personal history of malignant neoplasm of breast: Secondary | ICD-10-CM | POA: Insufficient documentation

## 2022-02-28 LAB — CBC WITH DIFFERENTIAL/PLATELET
Abs Immature Granulocytes: 0.01 10*3/uL (ref 0.00–0.07)
Basophils Absolute: 0 10*3/uL (ref 0.0–0.1)
Basophils Relative: 1 %
Eosinophils Absolute: 0.2 10*3/uL (ref 0.0–0.5)
Eosinophils Relative: 4 %
HCT: 36.8 % (ref 36.0–46.0)
Hemoglobin: 12.4 g/dL (ref 12.0–15.0)
Immature Granulocytes: 0 %
Lymphocytes Relative: 39 %
Lymphs Abs: 1.4 10*3/uL (ref 0.7–4.0)
MCH: 31.1 pg (ref 26.0–34.0)
MCHC: 33.7 g/dL (ref 30.0–36.0)
MCV: 92.2 fL (ref 80.0–100.0)
Monocytes Absolute: 0.4 10*3/uL (ref 0.1–1.0)
Monocytes Relative: 11 %
Neutro Abs: 1.6 10*3/uL — ABNORMAL LOW (ref 1.7–7.7)
Neutrophils Relative %: 45 %
Platelets: 288 10*3/uL (ref 150–400)
RBC: 3.99 MIL/uL (ref 3.87–5.11)
RDW: 13.6 % (ref 11.5–15.5)
WBC: 3.6 10*3/uL — ABNORMAL LOW (ref 4.0–10.5)
nRBC: 0 % (ref 0.0–0.2)

## 2022-02-28 LAB — COMPREHENSIVE METABOLIC PANEL
ALT: 21 U/L (ref 0–44)
AST: 20 U/L (ref 15–41)
Albumin: 3.9 g/dL (ref 3.5–5.0)
Alkaline Phosphatase: 67 U/L (ref 38–126)
Anion gap: 4 — ABNORMAL LOW (ref 5–15)
BUN: 16 mg/dL (ref 6–20)
CO2: 30 mmol/L (ref 22–32)
Calcium: 9.4 mg/dL (ref 8.9–10.3)
Chloride: 105 mmol/L (ref 98–111)
Creatinine, Ser: 0.87 mg/dL (ref 0.44–1.00)
GFR, Estimated: >60 mL/min (ref >60)
Glucose, Bld: 89 mg/dL (ref 70–99)
Potassium: 4.4 mmol/L (ref 3.5–5.1)
Sodium: 139 mmol/L (ref 135–145)
Total Bilirubin: 0.3 mg/dL (ref 0.3–1.2)
Total Protein: 7 g/dL (ref 6.5–8.1)

## 2022-02-28 LAB — VITAMIN D 25 HYDROXY (VIT D DEFICIENCY, FRACTURES): Vit D, 25-Hydroxy: 41.03 ng/mL (ref 30–100)

## 2022-03-01 ENCOUNTER — Ambulatory Visit (HOSPITAL_COMMUNITY): Payer: BC Managed Care – PPO | Admitting: Hematology

## 2022-03-09 ENCOUNTER — Inpatient Hospital Stay (HOSPITAL_BASED_OUTPATIENT_CLINIC_OR_DEPARTMENT_OTHER): Payer: BC Managed Care – PPO | Admitting: Hematology

## 2022-03-09 ENCOUNTER — Ambulatory Visit: Payer: BC Managed Care – PPO | Admitting: Family Medicine

## 2022-03-09 VITALS — BP 107/81 | HR 80 | Temp 98.6°F | Resp 17 | Ht 66.0 in | Wt 165.8 lb

## 2022-03-09 DIAGNOSIS — Z853 Personal history of malignant neoplasm of breast: Secondary | ICD-10-CM | POA: Diagnosis not present

## 2022-03-09 DIAGNOSIS — C50912 Malignant neoplasm of unspecified site of left female breast: Secondary | ICD-10-CM

## 2022-03-09 DIAGNOSIS — Z803 Family history of malignant neoplasm of breast: Secondary | ICD-10-CM | POA: Diagnosis not present

## 2022-03-09 DIAGNOSIS — R232 Flushing: Secondary | ICD-10-CM | POA: Diagnosis not present

## 2022-03-09 DIAGNOSIS — Z8042 Family history of malignant neoplasm of prostate: Secondary | ICD-10-CM | POA: Diagnosis not present

## 2022-03-09 MED ORDER — GABAPENTIN 300 MG PO CAPS: 300.0000 mg | ORAL_CAPSULE | Freq: Every day | ORAL | 6 refills | Status: DC

## 2022-03-09 NOTE — Patient Instructions (Addendum)
[facility] at [facility] Discharge Instructions  You were seen and examined today by Dr. Delton Coombes.  Dr. Delton Coombes discussed your most recent lab work and bone density scan and everything looks good.  Continue taking Anastrozole, Vitamin D and Calcium.   Follow-up as scheduled in 6 months.    Thank you for choosing [facility] at [facility] to provide your oncology and hematology care.  To afford each patient quality time with our provider, please arrive at least 15 minutes before your scheduled appointment time.   If you have a lab appointment with the [facility] please come in thru the Main Entrance and check in at the main information desk.  You need to re-schedule your appointment should you arrive 10 or more minutes late.  We strive to give you quality time with our providers, and arriving late affects you and other patients whose appointments are after yours.  Also, if you no show three or more times for appointments you may be dismissed from the clinic at the providers discretion.     Again, thank you for choosing [facility].  Our hope is that these requests will decrease the amount of time that you wait before being seen by our physicians.       _____________________________________________________________  Should you have questions after your visit to [facility], please contact our office at [phone] and follow the prompts.  Our office hours are 8:00 a.m. and 4:30 p.m. Monday - Friday.  Please note that voicemails left after 4:00 p.m. may not be returned until the following business day.  We are closed weekends and major holidays.  You do have access to a nurse 24-7, just call the main number to the clinic [phone] and do not press any options, hold on the line and a nurse will answer the phone.    For prescription refill requests, have your pharmacy contact our office and allow 72 hours.     Due to Covid, you will need to wear a mask upon entering the hospital. If you do not have a mask, a mask will be given to you at the Main Entrance upon arrival. For doctor visits, patients may have 1 support person age 70 or older with them. For treatment visits, patients can not have anyone with them due to social distancing guidelines and our immunocompromised population.

## 2022-03-09 NOTE — Progress Notes (Signed)
Doris Newman [address], [place] [address]   Patient Care Team: [name], [name], MD as PCP - General (Family Medicine) Donetta Potts, RN as Oncology Nurse Navigator (Oncology) Derek Jack, MD as Medical Oncologist (Oncology)  SUMMARY OF ONCOLOGIC HISTORY: Oncology History  Invasive ductal carcinoma of left breast ([facility])  02/19/2020 Initial Diagnosis   Invasive ductal carcinoma of left breast ([facility])   04/06/2020 Genetic Testing   Oncotype DX Breast Recurrence Score Report        CHIEF COMPLIANT: Follow-up for invasive ductal carcinoma of left breast   INTERVAL HISTORY: Doris Newman is a 51 y.o. female here today for follow up of her invasive ductal carcinoma of left breast. Her last visit was on 10/20/2021.   Today she reports feeling well. She reports constant right hip pain over the past 2 weeks. She denies history of arthritis. She reports severe hot flashes at night which have not been helped by Effexor. She also reports lower back pain. She is taking calcium and vitamin D.   REVIEW OF SYSTEMS:   Review of Systems  Constitutional:  Negative for appetite change and fatigue.  Endocrine: Positive for hot flashes.  Musculoskeletal:  Positive for arthralgias (6/10 hip) and back pain (lower).  Neurological:  Positive for headaches and numbness.  All other systems reviewed and are negative.  I have reviewed the past medical history, past surgical history, social history and family history with the patient and they are unchanged from previous note.   ALLERGIES:   has No Known Allergies.   MEDICATIONS:  Current Outpatient Medications  Medication Sig Dispense Refill   ALPRAZolam (XANAX) 1 MG tablet Take 1 mg by mouth 4 (four) times daily.     anastrozole (ARIMIDEX) 1 MG tablet Take 1 tablet (1 mg total) by mouth daily. 90 tablet 3   enalapril (VASOTEC) 20 MG tablet Take 1 tablet (20 mg total) by mouth daily. 90 tablet 3    fluticasone (FLONASE) 50 MCG/ACT nasal spray Place 1 spray into both nostrils 2 (two) times daily as needed for allergies or rhinitis. 48 g 1   hydrochlorothiazide (HYDRODIURIL) 25 MG tablet Take 1 tablet (25 mg total) by mouth daily. 90 tablet 3   mirtazapine (REMERON) 15 MG tablet Take 15 mg by mouth at bedtime as needed (sleep).      naproxen sodium (ALEVE) 220 MG tablet Take 440 mg by mouth daily as needed (pain).      omeprazole (PRILOSEC) 20 MG capsule Take 1 capsule (20 mg total) by mouth daily. 90 capsule 3   propranolol (INDERAL) 20 MG tablet Take 20 mg by mouth daily.      venlafaxine XR (EFFEXOR-XR) 37.5 MG 24 hr capsule Take 1 capsule (37.5 mg total) by mouth daily with breakfast. Take 1 capsule for 1 week, then take 2 capsules daily. 60 capsule 3   No current facility-administered medications for this visit.   Facility-Administered Medications Ordered in Other Visits  Medication Dose Route Frequency Provider Last Rate Last Admin   leuprolide (LUPRON) injection 11.25 mg  11.25 mg Intramuscular Once Derek Jack, MD         PHYSICAL EXAMINATION: Performance status (ECOG): 0 - Asymptomatic  There were no vitals filed for this visit. Wt Readings from Last 3 Encounters:  11/25/21 163 lb (73.9 kg)  10/20/21 161 lb (73 kg)  09/12/21 170 lb (77.1 kg)   Physical Exam Vitals reviewed.  Constitutional:      Appearance:  Normal appearance.  Cardiovascular:     Rate and Rhythm: Normal rate and regular rhythm.     Pulses: Normal pulses.     Heart sounds: Normal heart sounds.  Pulmonary:     Effort: Pulmonary effort is normal.     Breath sounds: Normal breath sounds.  Chest:  Breasts:    Right: Normal. No swelling, bleeding, inverted nipple, mass, nipple discharge, skin change or tenderness.     Left: Tenderness present. No swelling, bleeding, inverted nipple, mass, nipple discharge or skin change (lumpectomy scar UOQ around areola WNL).  Abdominal:     Palpations:  Abdomen is soft. There is no hepatomegaly or mass.     Tenderness: There is no abdominal tenderness.  Lymphadenopathy:     Upper Body:     Right upper body: No supraclavicular or axillary adenopathy.     Left upper body: No supraclavicular or axillary adenopathy.  Neurological:     General: No focal deficit present.     Mental Status: She is alert and oriented to person, place, and time.  Psychiatric:        Mood and Affect: Mood normal.        Behavior: Behavior normal.    Breast Exam Chaperone: Thana Ates     LABORATORY DATA:  I have reviewed the data as listed    Latest Ref Rng & Units 02/28/2022   10:28 AM 11/25/2021    9:12 AM 10/13/2021    1:18 PM  CMP  Glucose 70 - 99 mg/dL 89   88   90    BUN 6 - 20 mg/dL _0 Creatinine 0.44 - 1.00 mg/dL 0.87   0.98   0.97    Sodium 135 - 145 mmol/L 139   137   136    Potassium 3.5 - 5.1 mmol/L 4.4   4.4   4.1    Chloride 98 - 111 mmol/L 105   97   102    CO2 22 - 32 mmol/L _1 Calcium 8.9 - 10.3 mg/dL 9.4   9.8   9.1    Total Protein 6.5 - 8.1 g/dL 7.0   6.7   6.8    Total Bilirubin 0.3 - 1.2 mg/dL 0.3   0.4   0.4    Alkaline Phos 38 - 126 U/L 67   78   68    AST 15 - 41 U/L _2 ALT 0 - 44 U/L _3 No results found for: UXL244 Lab Results  Component Value Date   WBC 3.6 (L) 02/28/2022   HGB 12.4 02/28/2022   HCT 36.8 02/28/2022   MCV 92.2 02/28/2022   PLT 288 02/28/2022   NEUTROABS 1.6 (L) 02/28/2022    ASSESSMENT:  1.  Stage Ib (T2N0) left breast IDC: -[facility] breast [facility].  Bilateral mammogram and ultrasound on 02/04/2020 at [facility] showed 1.8 x 1.3 x 2.1 cm irregular hypoechoic mass at the 2 o'clock position 3 to 4 cm from nipple. -Ultrasound-guided biopsy on 02/04/2020 showed invasive ductal carcinoma, grade 2. -Left lumpectomy after needle localization and SLNB by Dr. Constance Haw on 03/08/2020. -Pathology-2.1 cm IDC, grade 1, 0/3 lymph nodes  positive, PT 2 PN 0, ER 95%, PR to 70%, HER-2 negative, Ki-67 10%.  Invasive carcinoma is broadly  less than 0.1 cm to the medial margin.  Reexcision margins were negative. -Oncotype DX recurrence score 16, distant recurrence risk at 9 years with tamoxifen/AI of 4%, absolute chemotherapy benefit less than 1%. -XRT to the left breast started on 05/04/2020 and finished in August 2021. -Lupron started on 05/31/2020 along with anastrozole.   2.  Family history: -Paternal grandmother had breast cancer. -Father had prostate cancer.     PLAN:  1.  Stage Ib left breast infiltrating ductal carcinoma: - She is receiving Lupron 11.25 every 3 months. - She is tolerating anastrozole reasonably well except hot flashes. - She had right hip pain for the last couple of weeks, predominantly when she lies on the right side.  She will call us if it does not improve in the next few weeks. - Reviewed mammogram from 02/28/2022, BI-RADS Category 2. - Physical examination today did not reveal any palpable masses in bilateral breast.  Left breast upper outer quadrant lumpectomy scar around the areola is stable.  No palpable adenopathy. - Labs from 02/28/2022 were reviewed which showed normal LFTs and CBC. - Continue Lupron injections every 3 months.  RTC 6 months for follow-up with repeat labs.   2.  Family history: - She has declined genetic testing in the past.   3.  Bone health: - Reviewed DEXA scan from 02/28/2022, T score -0.7 normal.  Vitamin D was 41. - Continue calcium and vitamin D supplements.   4.  Hot flashes: - She is taking Effexor 150 mg daily.  She still has hot flashes predominantly at nighttime. - We will start her on gabapentin 300 mg at bedtime.  She reportedly works night shifts most of the time. - If gabapentin helps, we will increase the dose and frequency if needed.  She will call us and let us know.  Breast Cancer therapy associated bone loss: I have recommended calcium, Vitamin D and  weight bearing exercises.  Orders placed this encounter:  No orders of the defined types were placed in this encounter.   The patient has a good understanding of the overall plan. She agrees with it. She will call with any problems that may develop before the next visit here.  Derek Jack, MD [facility] [phone]   I, Thana Ates, am acting as a scribe for Dr. Derek Jack.  I, Derek Jack MD, have reviewed the above documentation for accuracy and completeness, and I agree with the above.

## 2022-04-01 ENCOUNTER — Other Ambulatory Visit (HOSPITAL_COMMUNITY): Payer: Self-pay | Admitting: Hematology

## 2022-04-01 DIAGNOSIS — C50912 Malignant neoplasm of unspecified site of left female breast: Secondary | ICD-10-CM

## 2022-04-03 ENCOUNTER — Other Ambulatory Visit (HOSPITAL_COMMUNITY): Payer: Self-pay | Admitting: *Deleted

## 2022-04-03 ENCOUNTER — Encounter (HOSPITAL_COMMUNITY): Payer: Self-pay | Admitting: Hematology

## 2022-04-03 NOTE — Telephone Encounter (Signed)
Anastrozole refill approved.  Per last ovn, patient is tolerating and is to continue on therapy.  

## 2022-04-13 ENCOUNTER — Telehealth (HOSPITAL_COMMUNITY): Payer: Self-pay

## 2022-04-13 NOTE — Telephone Encounter (Signed)
Patient called to the clinic pertaining to billing and Lupron injections. Patient states, " Someone from billing told me it's a chemotherapy drug and Dr. Delton Coombes told me it was not. " Patient states she has an outstanding balance and is paying 20 dollars a month. Patient was instructed by [facility] to call us about this being a chemotherapy drug.   Called patient back and left message on her answering machine. Informed patient her concerns were addressed and questions sent to the appropriate department for a resolution.

## 2022-04-27 ENCOUNTER — Inpatient Hospital Stay (HOSPITAL_COMMUNITY): Payer: BC Managed Care – PPO

## 2022-05-04 ENCOUNTER — Inpatient Hospital Stay (HOSPITAL_COMMUNITY): Payer: BC Managed Care – PPO | Attending: Hematology

## 2022-05-04 VITALS — BP 121/83 | HR 80 | Temp 98.9°F | Resp 18

## 2022-05-04 DIAGNOSIS — Z79811 Long term (current) use of aromatase inhibitors: Secondary | ICD-10-CM | POA: Insufficient documentation

## 2022-05-04 DIAGNOSIS — C50412 Malignant neoplasm of upper-outer quadrant of left female breast: Secondary | ICD-10-CM | POA: Diagnosis not present

## 2022-05-04 DIAGNOSIS — Z170 Estrogen receptor positive status [ER+]: Secondary | ICD-10-CM | POA: Insufficient documentation

## 2022-05-04 DIAGNOSIS — Z5111 Encounter for antineoplastic chemotherapy: Secondary | ICD-10-CM | POA: Insufficient documentation

## 2022-05-04 DIAGNOSIS — C50912 Malignant neoplasm of unspecified site of left female breast: Secondary | ICD-10-CM

## 2022-05-04 DIAGNOSIS — R232 Flushing: Secondary | ICD-10-CM | POA: Insufficient documentation

## 2022-05-04 MED ORDER — LEUPROLIDE ACETATE (3 MONTH) 11.25 MG IM KIT
11.2500 mg | PACK | Freq: Once | INTRAMUSCULAR | Status: AC
  Administered 2022-05-04: 11.25 mg via INTRAMUSCULAR
  Filled 2022-05-04: qty 11.25

## 2022-05-04 NOTE — Progress Notes (Signed)
Doris Newman presents today for Lupron injection per the provider's orders.  Stable during administration without incident; injection site WNL; see MAR for injection details.  Patient tolerated procedure well and without incident.  No questions or complaints noted at this time.

## 2022-05-05 ENCOUNTER — Encounter (HOSPITAL_COMMUNITY): Payer: Self-pay | Admitting: Hematology

## 2022-05-18 ENCOUNTER — Ambulatory Visit: Payer: BC Managed Care – PPO | Admitting: Family Medicine

## 2022-06-09 ENCOUNTER — Ambulatory Visit: Payer: BC Managed Care – PPO | Admitting: Family Medicine

## 2022-06-09 ENCOUNTER — Encounter: Payer: Self-pay | Admitting: Family Medicine

## 2022-06-09 VITALS — BP 111/77 | HR 80 | Temp 98.4°F | Ht 66.0 in | Wt 171.0 lb

## 2022-06-09 DIAGNOSIS — Z029 Encounter for administrative examinations, unspecified: Secondary | ICD-10-CM

## 2022-06-09 DIAGNOSIS — F419 Anxiety disorder, unspecified: Secondary | ICD-10-CM

## 2022-06-09 DIAGNOSIS — G4489 Other headache syndrome: Secondary | ICD-10-CM | POA: Diagnosis not present

## 2022-06-09 MED ORDER — RIZATRIPTAN BENZOATE 5 MG PO TABS: 5.0000 mg | ORAL_TABLET | ORAL | 5 refills | Status: DC | PRN

## 2022-06-09 NOTE — Progress Notes (Signed)
BP 111/77   Pulse 80   Temp 98.4 F (36.9 C)   Ht '5\' 6"'$  (1.676 m)   Wt 171 lb (77.6 kg)   SpO2 96%   BMI 27.60 kg/m    Subjective:   Patient ID: Doris Newman, female    DOB: [birth year], 51 y.o.   MRN: [id number]  HPI: Doris Newman is a 51 y.o. female presenting on 06/09/2022 for FMLA (paperwork)   HPI Headaches and anxiety and FMLA Patient is coming in today for headaches and anxiety and FMLA.  She is she the occasional work for headaches FMLA to cover those days that she misses.  She says she gets bad migraines about once every couple months and sometimes last a day or 2.  She says she also gets anxiety that builds up sometimes and has panic attacks, sometimes 1-2 times per year.  She says she has had to miss work for that sometimes as well.  She is currently taking Effexor for prevention and then she uses Goody powder sometimes from headaches and migraines.  Relevant past medical, surgical, family and social history reviewed and updated as indicated. Interim medical history since our last visit reviewed. Allergies and medications reviewed and updated.  Review of Systems  Constitutional:  Negative for chills and fever.  Eyes:  Negative for redness and visual disturbance.  Respiratory:  Negative for chest tightness and shortness of breath.   Cardiovascular:  Negative for chest pain and leg swelling.  Musculoskeletal:  Negative for back pain and gait problem.  Skin:  Negative for rash.  Neurological:  Positive for headaches. Negative for light-headedness.  Psychiatric/Behavioral:  Negative for agitation, behavioral problems and dysphoric mood. The patient is nervous/anxious.   All other systems reviewed and are negative.   Per HPI unless specifically indicated above   Allergies as of 06/09/2022   No Known Allergies      Medication List        Accurate as of June 09, 2022  3:01 PM. If you have any questions, ask your nurse or doctor.           ALPRAZolam 1 MG tablet Commonly known as: XANAX Take 1 mg by mouth 4 (four) times daily.   anastrozole 1 MG tablet Commonly known as: ARIMIDEX TAKE 1 TABLET BY MOUTH  DAILY   enalapril 20 MG tablet Commonly known as: VASOTEC Take 1 tablet (20 mg total) by mouth daily.   fluticasone 50 MCG/ACT nasal spray Commonly known as: FLONASE Place 1 spray into both nostrils 2 (two) times daily as needed for allergies or rhinitis.   gabapentin 100 MG capsule Commonly known as: NEURONTIN 1 capsule Orally Once a day   gabapentin 300 MG capsule Commonly known as: NEURONTIN Take 1 capsule (300 mg total) by mouth at bedtime.   hydrochlorothiazide 25 MG tablet Commonly known as: HYDRODIURIL Take 1 tablet (25 mg total) by mouth daily.   mirtazapine 15 MG tablet Commonly known as: REMERON Take 15 mg by mouth at bedtime as needed (sleep).   naproxen sodium 220 MG tablet Commonly known as: ALEVE Take 440 mg by mouth daily as needed (pain).   omeprazole 20 MG capsule Commonly known as: PRILOSEC Take 1 capsule (20 mg total) by mouth daily.   propranolol 20 MG tablet Commonly known as: INDERAL Take 20 mg by mouth daily.   rizatriptan 5 MG tablet Commonly known as: MAXALT Take 1 tablet (5 mg total) by mouth as needed for migraine. May repeat in 2  hours if needed Started by: Worthy Rancher, MD   venlafaxine XR 37.5 MG 24 hr capsule Commonly known as: EFFEXOR-XR Take 1 capsule (37.5 mg total) by mouth daily with breakfast. Take 1 capsule for 1 week, then take 2 capsules daily.         Objective:   BP 111/77   Pulse 80   Temp 98.4 F (36.9 C)   Ht '5\' 6"'$  (1.676 m)   Wt 171 lb (77.6 kg)   SpO2 96%   BMI 27.60 kg/m   Wt Readings from Last 3 Encounters:  06/09/22 171 lb (77.6 kg)  03/09/22 165 lb 12.6 oz (75.2 kg)  11/25/21 163 lb (73.9 kg)    Physical Exam Vitals and nursing note reviewed.  Constitutional:      General: She is not in acute distress.     Appearance: She is well-developed. She is not diaphoretic.  Eyes:     Conjunctiva/sclera: Conjunctivae normal.  Cardiovascular:     Rate and Rhythm: Normal rate and regular rhythm.     Heart sounds: Normal heart sounds. No murmur heard. Pulmonary:     Effort: Pulmonary effort is normal. No respiratory distress.     Breath sounds: Normal breath sounds. No wheezing.  Skin:    General: Skin is warm and dry.     Findings: No rash.  Neurological:     Mental Status: She is alert and oriented to person, place, and time.     Coordination: Coordination normal.  Psychiatric:        Behavior: Behavior normal.       Assessment & Plan:   Problem List Items Addressed This Visit   None Visit Diagnoses     Headache syndrome    -  Primary   Relevant Medications   gabapentin (NEURONTIN) 100 MG capsule   rizatriptan (MAXALT) 5 MG tablet   Anxiety           We will do FMLA saying that she can have 1 to 2 days of migraines or anxiety attacks every 3 months. Follow up plan: Return in about 6 months (around 12/10/2022), or if symptoms worsen or fail to improve, for Physical and labs.  Counseling provided for all of the vaccine components No orders of the defined types were placed in this encounter.   Caryl Pina, MD [facility] 06/09/2022, 3:01 PM

## 2022-06-21 ENCOUNTER — Telehealth: Payer: Self-pay | Admitting: Family Medicine

## 2022-06-21 NOTE — Telephone Encounter (Signed)
On providers desk for patient to sign

## 2022-06-21 NOTE — Telephone Encounter (Signed)
Patient aware.

## 2022-07-13 DIAGNOSIS — F331 Major depressive disorder, recurrent, moderate: Secondary | ICD-10-CM | POA: Diagnosis not present

## 2022-07-28 ENCOUNTER — Encounter (HOSPITAL_COMMUNITY): Payer: Self-pay | Admitting: Hematology

## 2022-08-03 ENCOUNTER — Encounter (HOSPITAL_COMMUNITY): Payer: Self-pay | Admitting: Hematology

## 2022-08-03 ENCOUNTER — Inpatient Hospital Stay (HOSPITAL_COMMUNITY): Payer: BC Managed Care – PPO

## 2022-08-08 ENCOUNTER — Encounter (HOSPITAL_COMMUNITY): Payer: Self-pay | Admitting: Hematology

## 2022-08-10 ENCOUNTER — Inpatient Hospital Stay: Payer: BC Managed Care – PPO

## 2022-08-11 ENCOUNTER — Inpatient Hospital Stay: Payer: BC Managed Care – PPO | Attending: Hematology

## 2022-08-11 ENCOUNTER — Inpatient Hospital Stay: Payer: BC Managed Care – PPO

## 2022-08-11 VITALS — BP 133/99 | HR 68 | Temp 97.7°F | Resp 18

## 2022-08-11 DIAGNOSIS — Z803 Family history of malignant neoplasm of breast: Secondary | ICD-10-CM | POA: Insufficient documentation

## 2022-08-11 DIAGNOSIS — Z79818 Long term (current) use of other agents affecting estrogen receptors and estrogen levels: Secondary | ICD-10-CM | POA: Insufficient documentation

## 2022-08-11 DIAGNOSIS — Z923 Personal history of irradiation: Secondary | ICD-10-CM | POA: Diagnosis not present

## 2022-08-11 DIAGNOSIS — C50912 Malignant neoplasm of unspecified site of left female breast: Secondary | ICD-10-CM | POA: Diagnosis not present

## 2022-08-11 DIAGNOSIS — Z79811 Long term (current) use of aromatase inhibitors: Secondary | ICD-10-CM | POA: Insufficient documentation

## 2022-08-11 DIAGNOSIS — Z79899 Other long term (current) drug therapy: Secondary | ICD-10-CM | POA: Insufficient documentation

## 2022-08-11 DIAGNOSIS — N951 Menopausal and female climacteric states: Secondary | ICD-10-CM | POA: Insufficient documentation

## 2022-08-11 DIAGNOSIS — Z8042 Family history of malignant neoplasm of prostate: Secondary | ICD-10-CM | POA: Diagnosis not present

## 2022-08-11 DIAGNOSIS — Z170 Estrogen receptor positive status [ER+]: Secondary | ICD-10-CM | POA: Diagnosis not present

## 2022-08-11 LAB — COMPREHENSIVE METABOLIC PANEL
ALT: 19 U/L (ref 0–44)
AST: 18 U/L (ref 15–41)
Albumin: 4 g/dL (ref 3.5–5.0)
Alkaline Phosphatase: 66 U/L (ref 38–126)
Anion gap: 10 (ref 5–15)
BUN: 23 mg/dL — ABNORMAL HIGH (ref 6–20)
CO2: 27 mmol/L (ref 22–32)
Calcium: 9.3 mg/dL (ref 8.9–10.3)
Chloride: 101 mmol/L (ref 98–111)
Creatinine, Ser: 0.94 mg/dL (ref 0.44–1.00)
GFR, Estimated: >60 mL/min (ref >60)
Glucose, Bld: 100 mg/dL — ABNORMAL HIGH (ref 70–99)
Potassium: 4.3 mmol/L (ref 3.5–5.1)
Sodium: 138 mmol/L (ref 135–145)
Total Bilirubin: 0.5 mg/dL (ref 0.3–1.2)
Total Protein: 7 g/dL (ref 6.5–8.1)

## 2022-08-11 LAB — CBC WITH DIFFERENTIAL/PLATELET
Abs Immature Granulocytes: 0.01 10*3/uL (ref 0.00–0.07)
Basophils Absolute: 0 10*3/uL (ref 0.0–0.1)
Basophils Relative: 1 %
Eosinophils Absolute: 0.2 10*3/uL (ref 0.0–0.5)
Eosinophils Relative: 5 %
HCT: 35.1 % — ABNORMAL LOW (ref 36.0–46.0)
Hemoglobin: 12.2 g/dL (ref 12.0–15.0)
Immature Granulocytes: 0 %
Lymphocytes Relative: 45 %
Lymphs Abs: 1.8 10*3/uL (ref 0.7–4.0)
MCH: 31.6 pg (ref 26.0–34.0)
MCHC: 34.8 g/dL (ref 30.0–36.0)
MCV: 90.9 fL (ref 80.0–100.0)
Monocytes Absolute: 0.4 10*3/uL (ref 0.1–1.0)
Monocytes Relative: 10 %
Neutro Abs: 1.5 10*3/uL — ABNORMAL LOW (ref 1.7–7.7)
Neutrophils Relative %: 39 %
Platelets: 247 10*3/uL (ref 150–400)
RBC: 3.86 MIL/uL — ABNORMAL LOW (ref 3.87–5.11)
RDW: 13.2 % (ref 11.5–15.5)
WBC: 3.9 10*3/uL — ABNORMAL LOW (ref 4.0–10.5)
nRBC: 0 % (ref 0.0–0.2)

## 2022-08-11 LAB — VITAMIN D 25 HYDROXY (VIT D DEFICIENCY, FRACTURES): Vit D, 25-Hydroxy: 38.81 ng/mL (ref 30–100)

## 2022-08-11 MED ORDER — LEUPROLIDE ACETATE (3 MONTH) 11.25 MG IM KIT
11.2500 mg | PACK | Freq: Once | INTRAMUSCULAR | Status: AC
  Administered 2022-08-11: 11.25 mg via INTRAMUSCULAR
  Filled 2022-08-11: qty 11.25

## 2022-08-11 NOTE — Patient Instructions (Signed)
[facility]  Discharge Instructions: Thank you for choosing [facility] to provide your oncology and hematology care.  If you have a lab appointment with the [facility], please come in thru the Main Entrance and check in at the main information desk.  Wear comfortable clothing and clothing appropriate for easy access to any Portacath or PICC line.   We strive to give you quality time with your provider. You may need to reschedule your appointment if you arrive late (15 or more minutes).  Arriving late affects you and other patients whose appointments are after yours.  Also, if you miss three or more appointments without notifying the office, you may be dismissed from the clinic at the provider's discretion.      For prescription refill requests, have your pharmacy contact our office and allow 72 hours for refills to be completed.    Today you received the following chemotherapy and/or immunotherapy agents Lupron      To help prevent nausea and vomiting after your treatment, we encourage you to take your nausea medication as directed.  BELOW ARE SYMPTOMS THAT SHOULD BE REPORTED IMMEDIATELY: *FEVER GREATER THAN 100.4 F (38 C) OR HIGHER *CHILLS OR SWEATING *NAUSEA AND VOMITING THAT IS NOT CONTROLLED WITH YOUR NAUSEA MEDICATION *UNUSUAL SHORTNESS OF BREATH *UNUSUAL BRUISING OR BLEEDING *URINARY PROBLEMS (pain or burning when urinating, or frequent urination) *BOWEL PROBLEMS (unusual diarrhea, constipation, pain near the anus) TENDERNESS IN MOUTH AND THROAT WITH OR WITHOUT PRESENCE OF ULCERS (sore throat, sores in mouth, or a toothache) UNUSUAL RASH, SWELLING OR PAIN  UNUSUAL VAGINAL DISCHARGE OR ITCHING   Items with * indicate a potential emergency and should be followed up as soon as possible or go to the Emergency Department if any problems should occur.  Please show the CHEMOTHERAPY ALERT CARD or IMMUNOTHERAPY ALERT CARD at check-in to the Emergency  Department and triage nurse.  Should you have questions after your visit or need to cancel or reschedule your appointment, please contact [facility] [phone]  and follow the prompts.  Office hours are 8:00 a.m. to 4:30 p.m. Monday - Friday. Please note that voicemails left after 4:00 p.m. may not be returned until the following business day.  We are closed weekends and major holidays. You have access to a nurse at all times for urgent questions. Please call the main number to the clinic [phone] and follow the prompts.  For any non-urgent questions, you may also contact your provider using MyChart. We now offer e-Visits for anyone 76 and older to request care online for non-urgent symptoms. For details visit [email or web address].   Also download the MyChart app! Go to the app store, search "MyChart", open the app, select [facility], and log in with your MyChart username and password.  Masks are optional in the cancer centers. If you would like for your care team to wear a mask while they are taking care of you, please let them know. You may have one support person who is at least 51 years old accompany you for your appointments.

## 2022-08-11 NOTE — Progress Notes (Signed)
Patient presents today for Lupron injection per providers order.  Vital signs WNL.  Patient has no new complaints at this time.  Stable during administration without incident; injection site WNL; see MAR for injection details.  Patient tolerated procedure well and without incident.  No questions or complaints noted at this time.

## 2022-08-17 ENCOUNTER — Inpatient Hospital Stay (HOSPITAL_COMMUNITY): Payer: BC Managed Care – PPO | Admitting: Hematology

## 2022-08-24 ENCOUNTER — Inpatient Hospital Stay: Payer: BC Managed Care – PPO | Admitting: Hematology

## 2022-08-28 ENCOUNTER — Inpatient Hospital Stay: Payer: BC Managed Care – PPO | Admitting: Hematology

## 2022-08-31 ENCOUNTER — Inpatient Hospital Stay: Payer: BC Managed Care – PPO | Admitting: Hematology

## 2022-09-06 ENCOUNTER — Other Ambulatory Visit (HOSPITAL_COMMUNITY): Payer: Self-pay | Admitting: Hematology

## 2022-09-06 ENCOUNTER — Inpatient Hospital Stay: Payer: BC Managed Care – PPO | Attending: Hematology | Admitting: Hematology

## 2022-09-06 ENCOUNTER — Other Ambulatory Visit: Payer: Self-pay | Admitting: Hematology

## 2022-09-06 DIAGNOSIS — C50919 Malignant neoplasm of unspecified site of unspecified female breast: Secondary | ICD-10-CM

## 2022-09-06 DIAGNOSIS — C50912 Malignant neoplasm of unspecified site of left female breast: Secondary | ICD-10-CM | POA: Diagnosis not present

## 2022-09-06 MED ORDER — GABAPENTIN 300 MG PO CAPS
600.0000 mg | ORAL_CAPSULE | Freq: Every day | ORAL | 6 refills | Status: AC
Start: 2022-09-06 — End: ?

## 2022-09-06 NOTE — Patient Instructions (Addendum)
[facility] at [facility] Discharge Instructions   You were seen and examined today by Dr. Delton Coombes.  He reviewed the results of your lab work from October which are normal/stable.   Continue anastrozole as prescribed.  Continue calcium + Vitamin D supplements.   You may increase the gabapentin to 600 mg at bedtime for help with hot flashes.  Return as scheduled.     Thank you for choosing [place] at [facility] to provide your oncology and hematology care.  To afford each patient quality time with our provider, please arrive at least 15 minutes before your scheduled appointment time.   If you have a lab appointment with the [facility] please come in thru the Main Entrance and check in at the main information desk.  You need to re-schedule your appointment should you arrive 10 or more minutes late.  We strive to give you quality time with our providers, and arriving late affects you and other patients whose appointments are after yours.  Also, if you no show three or more times for appointments you may be dismissed from the clinic at the providers discretion.     Again, thank you for choosing [facility].  Our hope is that these requests will decrease the amount of time that you wait before being seen by our physicians.       _____________________________________________________________  Should you have questions after your visit to [facility], please contact our office at [phone] and follow the prompts.  Our office hours are 8:00 a.m. and 4:30 p.m. Monday - Friday.  Please note that voicemails left after 4:00 p.m. may not be returned until the following business day.  We are closed weekends and major holidays.  You do have access to a nurse 24-7, just call the main number to the clinic [phone] and do not press any options, hold on the line and a nurse will answer the phone.    For prescription  refill requests, have your pharmacy contact our office and allow 72 hours.    Due to Covid, you will need to wear a mask upon entering the hospital. If you do not have a mask, a mask will be given to you at the Main Entrance upon arrival. For doctor visits, patients may have 1 support person age 24 or older with them. For treatment visits, patients can not have anyone with them due to social distancing guidelines and our immunocompromised population.

## 2022-09-06 NOTE — Progress Notes (Addendum)
Virtual Visit via Video Note  I connected with Doris Newman on 09/06/22 at 10:00 AM EST by a video enabled telemedicine application and verified that I am speaking with the correct person using two identifiers.  Location: Patient: In office Provider: In the office   I discussed the limitations of evaluation and management by telemedicine and the availability of in person appointments. The patient expressed understanding and agreed to proceed.  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  She was started on Lupron around 05/31/2020 along with anastrozole.   Observations/Objective: She is tolerating Lupron and anastrozole except hot flashes.  Denies any new onset pains.  Assessment and Plan:  1.  Stage I left breast IDC: - Reviewed labs which showed normal LFTs.  CBC shows mild leukopenia which is stable. - Next Lupron on 11/03/2022. - Will schedule mammogram in May 2024.  RTC 6 months. - She complained of lower back pain for the last 3 weeks.  She will call us if pain persists in 4 weeks.  2.  Hot flashes: - Continue Effexor 150 mg daily.  She is taking gabapentin 300 mg at bedtime.  It is not helping.  Will increase gabapentin to 600 mg at bedtime.  3.  Bone health: - DEXA scan from 02/28/2022 T-score -0.7, normal. - Vitamin D is 38.  Continue calcium and vitamin D supplements.   Follow Up Instructions:    I discussed the assessment and treatment plan with the patient. The patient was provided an opportunity to ask questions and all were answered. The patient agreed with the plan and demonstrated an understanding of the instructions.   The patient was advised to call back or seek an in-person evaluation if the symptoms worsen or if the condition fails to improve as anticipated.  I provided 21 minutes of non-face-to-face time during this encounter.   Derek Jack, MD

## 2022-09-21 ENCOUNTER — Encounter (HOSPITAL_COMMUNITY): Payer: Self-pay | Admitting: Hematology

## 2022-10-19 LAB — HM PAP SMEAR: CHL HPV: NEGATIVE

## 2022-10-20 ENCOUNTER — Encounter (HOSPITAL_COMMUNITY): Payer: Self-pay | Admitting: Hematology

## 2022-10-30 ENCOUNTER — Telehealth: Payer: Self-pay | Admitting: Surgical

## 2022-11-03 ENCOUNTER — Encounter (HOSPITAL_COMMUNITY): Payer: Self-pay | Admitting: Hematology

## 2022-11-03 ENCOUNTER — Ambulatory Visit: Payer: BC Managed Care – PPO

## 2022-11-10 ENCOUNTER — Inpatient Hospital Stay: Payer: 59 | Attending: Hematology

## 2022-11-10 VITALS — BP 118/91 | HR 82 | Temp 98.2°F | Resp 18

## 2022-11-10 DIAGNOSIS — C50912 Malignant neoplasm of unspecified site of left female breast: Secondary | ICD-10-CM | POA: Diagnosis present

## 2022-11-10 DIAGNOSIS — Z170 Estrogen receptor positive status [ER+]: Secondary | ICD-10-CM | POA: Diagnosis not present

## 2022-11-10 DIAGNOSIS — Z79811 Long term (current) use of aromatase inhibitors: Secondary | ICD-10-CM | POA: Insufficient documentation

## 2022-11-10 MED ORDER — LEUPROLIDE ACETATE (3 MONTH) 11.25 MG IM KIT
11.2500 mg | PACK | Freq: Once | INTRAMUSCULAR | Status: AC
  Administered 2022-11-10: 11.25 mg via INTRAMUSCULAR
  Filled 2022-11-10: qty 11.25

## 2022-11-10 NOTE — Patient Instructions (Signed)
[facility]  Discharge Instructions: Thank you for choosing [facility] to provide your oncology and hematology care.  If you have a lab appointment with the [facility], please come in thru the Main Entrance and check in at the main information desk.  Wear comfortable clothing and clothing appropriate for easy access to any Portacath or PICC line.   We strive to give you quality time with your provider. You may need to reschedule your appointment if you arrive late (15 or more minutes).  Arriving late affects you and other patients whose appointments are after yours.  Also, if you miss three or more appointments without notifying the office, you may be dismissed from the clinic at the provider's discretion.      For prescription refill requests, have your pharmacy contact our office and allow 72 hours for refills to be completed.    Today you received the following chemotherapy and/or immunotherapy agents Lupron.      To help prevent nausea and vomiting after your treatment, we encourage you to take your nausea medication as directed.  BELOW ARE SYMPTOMS THAT SHOULD BE REPORTED IMMEDIATELY: *FEVER GREATER THAN 100.4 F (38 C) OR HIGHER *CHILLS OR SWEATING *NAUSEA AND VOMITING THAT IS NOT CONTROLLED WITH YOUR NAUSEA MEDICATION *UNUSUAL SHORTNESS OF BREATH *UNUSUAL BRUISING OR BLEEDING *URINARY PROBLEMS (pain or burning when urinating, or frequent urination) *BOWEL PROBLEMS (unusual diarrhea, constipation, pain near the anus) TENDERNESS IN MOUTH AND THROAT WITH OR WITHOUT PRESENCE OF ULCERS (sore throat, sores in mouth, or a toothache) UNUSUAL RASH, SWELLING OR PAIN  UNUSUAL VAGINAL DISCHARGE OR ITCHING   Items with * indicate a potential emergency and should be followed up as soon as possible or go to the Emergency Department if any problems should occur.  Please show the CHEMOTHERAPY ALERT CARD or IMMUNOTHERAPY ALERT CARD at check-in to the Emergency  Department and triage nurse.  Should you have questions after your visit or need to cancel or reschedule your appointment, please contact [facility] [phone]  and follow the prompts.  Office hours are 8:00 a.m. to 4:30 p.m. Monday - Friday. Please note that voicemails left after 4:00 p.m. may not be returned until the following business day.  We are closed weekends and major holidays. You have access to a nurse at all times for urgent questions. Please call the main number to the clinic [phone] and follow the prompts.  For any non-urgent questions, you may also contact your provider using MyChart. We now offer e-Visits for anyone 22 and older to request care online for non-urgent symptoms. For details visit [email or web address].   Also download the MyChart app! Go to the app store, search "MyChart", open the app, select [facility], and log in with your MyChart username and password.

## 2022-11-10 NOTE — Progress Notes (Signed)
Doris Newman presents today for injection per the provider's orders.  Lupron administration without incident; injection site WNL; see MAR for injection details.  Patient tolerated procedure well and without incident. Patient remained stable throughout visit.  No questions or complaints noted at this time. Patient discharged ambulatory and in stable condition.

## 2022-11-29 ENCOUNTER — Other Ambulatory Visit: Payer: Self-pay | Admitting: Family Medicine

## 2022-11-29 ENCOUNTER — Encounter (HOSPITAL_COMMUNITY): Payer: Self-pay | Admitting: Hematology

## 2022-11-29 DIAGNOSIS — I83813 Varicose veins of bilateral lower extremities with pain: Secondary | ICD-10-CM

## 2022-11-29 DIAGNOSIS — K219 Gastro-esophageal reflux disease without esophagitis: Secondary | ICD-10-CM

## 2022-11-29 DIAGNOSIS — R609 Edema, unspecified: Secondary | ICD-10-CM

## 2022-11-29 DIAGNOSIS — I10 Essential (primary) hypertension: Secondary | ICD-10-CM

## 2022-12-11 ENCOUNTER — Encounter (HOSPITAL_COMMUNITY): Payer: Self-pay | Admitting: Hematology

## 2022-12-11 ENCOUNTER — Ambulatory Visit: Payer: BC Managed Care – PPO | Admitting: Family Medicine

## 2022-12-29 ENCOUNTER — Encounter (HOSPITAL_COMMUNITY): Payer: Self-pay | Admitting: Hematology

## 2023-01-03 ENCOUNTER — Ambulatory Visit: Payer: Medicaid Other | Admitting: Family Medicine

## 2023-01-05 ENCOUNTER — Ambulatory Visit: Payer: Medicaid Other | Admitting: Family Medicine

## 2023-01-05 VITALS — BP 117/80 | HR 83 | Ht 66.0 in | Wt 195.0 lb

## 2023-01-05 DIAGNOSIS — E7800 Pure hypercholesterolemia, unspecified: Secondary | ICD-10-CM

## 2023-01-05 DIAGNOSIS — G4489 Other headache syndrome: Secondary | ICD-10-CM | POA: Insufficient documentation

## 2023-01-05 DIAGNOSIS — K219 Gastro-esophageal reflux disease without esophagitis: Secondary | ICD-10-CM

## 2023-01-05 DIAGNOSIS — M533 Sacrococcygeal disorders, not elsewhere classified: Secondary | ICD-10-CM

## 2023-01-05 DIAGNOSIS — I83813 Varicose veins of bilateral lower extremities with pain: Secondary | ICD-10-CM | POA: Diagnosis not present

## 2023-01-05 DIAGNOSIS — R609 Edema, unspecified: Secondary | ICD-10-CM

## 2023-01-05 DIAGNOSIS — H9313 Tinnitus, bilateral: Secondary | ICD-10-CM | POA: Diagnosis not present

## 2023-01-05 DIAGNOSIS — I10 Essential (primary) hypertension: Secondary | ICD-10-CM

## 2023-01-05 MED ORDER — FLUTICASONE PROPIONATE 50 MCG/ACT NA SUSP: 1.0000 | Freq: Two times a day (BID) | NASAL | 1 refills | Status: AC | PRN

## 2023-01-05 MED ORDER — HYDROCHLOROTHIAZIDE 25 MG PO TABS: 25.0000 mg | ORAL_TABLET | Freq: Every day | ORAL | 3 refills | Status: DC

## 2023-01-05 MED ORDER — OMEPRAZOLE 20 MG PO CPDR: 20.0000 mg | DELAYED_RELEASE_CAPSULE | Freq: Every day | ORAL | 3 refills | Status: DC

## 2023-01-05 MED ORDER — ENALAPRIL MALEATE 20 MG PO TABS: 20.0000 mg | ORAL_TABLET | Freq: Every day | ORAL | 3 refills | Status: DC

## 2023-01-05 MED ORDER — RIZATRIPTAN BENZOATE 5 MG PO TABS: 5.0000 mg | ORAL_TABLET | ORAL | 5 refills | Status: DC | PRN

## 2023-01-05 NOTE — Progress Notes (Signed)
BP 117/80   Pulse 83   Ht 5\' 6"  (1.676 m)   Wt 88.5 kg   SpO2 97%   BMI 31.47 kg/m    Subjective:   Patient ID: Doris Newman, female    DOB: Apr 16, 1971, 52 y.o.   MRN: [id number]  HPI: Doris Newman is a 52 y.o. female presenting on 01/05/2023 for Medical Management of Chronic Issues, Hypertension, Tinnitus (Left ear), Back Pain (Left lower), and Migraine   Patient is coming in today for a medication refill of Enalapril, Flonase, HCTZ, Omeprazole, and Rizatriptan Benzoate. Patient has history of migraines and patient states the medication prescribed has been helping decrease the number of headaches she is experiencing.   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 Patient states that since starting this new job, she has had left ear ringing mostly at work, after work, and occasionally at home or when driving. Patient wears earplugs at work. Patient states she has been using q-tips to clean her ears. Patient denies pain, recent infection, dizziness.   Patient is also presenting with left lower back pain that started a few weeks ago since starting her new job. Patient has been using Tylenol with some effectiveness. Pain is 8/10 and occurs when she extends her back and is standing for long periods of time at work. Denies numbness and tingling.    Hypertension Patient is coming in for a recheck of their hypertension. Patient is currently taking Enalapril & HCTZ. Patient's blood pressure today is 117/80. Patient denies lightheadedness, dizziness, weakness, headaches, blurred vision, chest pains, and shortness of breath. Patient does report some peripheral edema. Patient is content with current medication(s).   Relevant past medical, surgical, family and social history were reviewed and updated as indicated. Interim medical history were reviewed. Allergies and medications were reviewed and  updated.  Review of Systems  Constitutional:  Negative for chills and fever.  HENT:  Positive for tinnitus. Negative for congestion, ear discharge, ear pain, rhinorrhea and sore throat.   Respiratory:  Negative for cough, chest tightness and shortness of breath.   Cardiovascular:  Positive for leg swelling. Negative for chest pain.  Gastrointestinal:  Negative for abdominal pain, constipation, diarrhea, nausea and vomiting.  Genitourinary:  Negative for decreased urine volume, difficulty urinating, dysuria, flank pain, frequency, hematuria and pelvic pain.  Musculoskeletal:  Positive for back pain. Negative for gait problem.  Skin:  Negative for rash and wound.  Neurological:  Negative for dizziness, weakness, light-headedness, numbness and headaches.  Psychiatric/Behavioral:  Negative for behavioral problems and confusion.     Per HPI unless specifically indicated above.   Allergies as of 01/05/2023   No Known Allergies      Medication List        Accurate as of January 05, 2023  9:42 AM. If you have any questions, ask your nurse or doctor.          ALPRAZolam 1 MG tablet Commonly known as: XANAX Take 1 mg by mouth 4 (four) times daily.   anastrozole 1 MG tablet Commonly known as: ARIMIDEX TAKE 1 TABLET BY MOUTH  DAILY   enalapril 20 MG tablet Commonly known as: VASOTEC Take 1 tablet (20 mg total) by mouth daily.   fluticasone 50 MCG/ACT nasal spray Commonly known as: FLONASE Place 1 spray into both nostrils 2 (two) times daily as needed for allergies or rhinitis.   gabapentin  300 MG capsule Commonly known as: NEURONTIN Take 2 capsules (600 mg total) by mouth at bedtime.   hydrochlorothiazide 25 MG tablet Commonly known as: HYDRODIURIL Take 1 tablet (25 mg total) by mouth daily.   mirtazapine 15 MG tablet Commonly known as: REMERON Take 15 mg by mouth at bedtime as needed (sleep).   naproxen sodium 220 MG tablet Commonly known as: ALEVE Take 440 mg by  mouth daily as needed (pain).   omeprazole 20 MG capsule Commonly known as: PRILOSEC Take 1 capsule (20 mg total) by mouth daily.   propranolol 20 MG tablet Commonly known as: INDERAL Take 20 mg by mouth daily.   rizatriptan 5 MG tablet Commonly known as: MAXALT Take 1 tablet (5 mg total) by mouth as needed for migraine. May repeat in 2 hours if needed   venlafaxine XR 37.5 MG 24 hr capsule Commonly known as: EFFEXOR-XR Take 1 capsule (37.5 mg total) by mouth daily with breakfast. Take 1 capsule for 1 week, then take 2 capsules daily.         Objective:   BP 117/80   Pulse 83   Ht 5\' 6"  (1.676 m)   Wt 88.5 kg   SpO2 97%   BMI 31.47 kg/m   Wt Readings from Last 3 Encounters:  01/05/23 88.5 kg  06/09/22 77.6 kg  03/09/22 75.2 kg    Physical Exam Vitals reviewed.  Constitutional:      Appearance: Normal appearance.  HENT:     Head: Normocephalic.     Right Ear: Tympanic membrane, ear canal and external ear normal. No drainage, swelling or tenderness. There is no impacted cerumen.     Left Ear: Tympanic membrane, ear canal and external ear normal. No drainage, swelling or tenderness. There is no impacted cerumen.     Mouth/Throat:     Mouth: Mucous membranes are moist. No oral lesions.     Tongue: No lesions. Tongue does not deviate from midline.     Palate: No lesions.     Pharynx: Oropharynx is clear. No oropharyngeal exudate or posterior oropharyngeal erythema.  Eyes:     General: No scleral icterus.    Conjunctiva/sclera: Conjunctivae normal.  Cardiovascular:     Rate and Rhythm: Normal rate and regular rhythm.     Heart sounds: Normal heart sounds.  Pulmonary:     Effort: Pulmonary effort is normal.     Breath sounds: Normal breath sounds.  Abdominal:     Tenderness: There is no right CVA tenderness or left CVA tenderness.  Musculoskeletal:     Cervical back: Neck supple.     Lumbar back: Tenderness present. No swelling, deformity or bony tenderness.  Normal range of motion. Negative left straight leg raise test.       Back:     Right lower leg: No edema.     Left lower leg: Edema present.  Lymphadenopathy:     Cervical: No cervical adenopathy.  Skin:    General: Skin is warm and dry.  Neurological:     Mental Status: She is alert and oriented to person, place, and time.     Gait: Gait normal.  Psychiatric:        Mood and Affect: Mood normal.        Behavior: Behavior normal.     Assessment & Plan:   Problem List Items Addressed This Visit       Cardiovascular and Mediastinum   Essential hypertension - Primary   Relevant Medications  enalapril (VASOTEC) 20 MG tablet   hydrochlorothiazide (HYDRODIURIL) 25 MG tablet   Other Relevant Orders   CMP14+EGFR   Lipid panel     Digestive   Gastroesophageal reflux disease without esophagitis   Relevant Medications   omeprazole (PRILOSEC) 20 MG capsule   Other Relevant Orders   CBC with Differential/Platelet   CMP14+EGFR     Other   Hyperlipidemia   Relevant Medications   enalapril (VASOTEC) 20 MG tablet   hydrochlorothiazide (HYDRODIURIL) 25 MG tablet   Other Relevant Orders   Lipid panel   Headache syndrome   Relevant Medications   rizatriptan (MAXALT) 5 MG tablet   Other Visit Diagnoses     Peripheral edema       Relevant Medications   hydrochlorothiazide (HYDRODIURIL) 25 MG tablet   Varicose veins of both lower extremities with pain       Relevant Medications   enalapril (VASOTEC) 20 MG tablet   hydrochlorothiazide (HYDRODIURIL) 25 MG tablet   Tinnitus of both ears       SI (sacroiliac) joint dysfunction          Will do lab work today. Medication refills made. Continue all medications as prescribed. Use compression stockings and elevate legs for peripheral edema.  Patient instructed to use Tylenol, Ibuprofen, and Voltaren gel for SI joint pain in left lower back region. Patient also instructed to purchase shoe inserts to help with pressure on the  joint.  Patient instructed to try white noise at home for recurring tinnitus and continue wearing ear plugs at work. Patient also provided education on the option of hearing aids, but patient denied at this time.   Follow up plan: Return in about 6 months (around 07/08/2023), or if symptoms worsen or fail to improve, for Migraines and hypertension and cholesterol.  Counseling provided for all of the vaccine components Orders Placed This Encounter  Procedures   CBC with Differential/Platelet   CMP14+EGFR   Lipid panel   [address], PA-S2 [facility] 01/05/2023, 9:42 AM   Patient seen and examined, agree with assessment and plan above.  Agree with over-the-counter agents to first help with arthritic conditions and return if worsens. Vonna Kotyk Mirna S[facility] 01/05/2023, 9:42 AM

## 2023-01-06 LAB — CBC WITH DIFFERENTIAL/PLATELET
Basophils Absolute: 0.1 10*3/uL (ref 0.0–0.2)
Basos: 1 %
EOS (ABSOLUTE): 0.2 10*3/uL (ref 0.0–0.4)
Eos: 4 %
Hematocrit: 36.4 % (ref 34.0–46.6)
Hemoglobin: 11.9 g/dL (ref 11.1–15.9)
Immature Grans (Abs): 0 10*3/uL (ref 0.0–0.1)
Immature Granulocytes: 0 %
Lymphocytes Absolute: 1.7 10*3/uL (ref 0.7–3.1)
Lymphs: 34 %
MCH: 29.9 pg (ref 26.6–33.0)
MCHC: 32.7 g/dL (ref 31.5–35.7)
MCV: 92 fL (ref 79–97)
Monocytes Absolute: 0.4 10*3/uL (ref 0.1–0.9)
Monocytes: 9 %
Neutrophils Absolute: 2.6 10*3/uL (ref 1.4–7.0)
Neutrophils: 52 %
Platelets: 276 10*3/uL (ref 150–450)
RBC: 3.98 x10E6/uL (ref 3.77–5.28)
RDW: 13.5 % (ref 11.7–15.4)
WBC: 4.9 10*3/uL (ref 3.4–10.8)

## 2023-01-06 LAB — LIPID PANEL
Chol/HDL Ratio: 3.9 ratio (ref 0.0–4.4)
Cholesterol, Total: 278 mg/dL — ABNORMAL HIGH (ref 100–199)
HDL: 72 mg/dL (ref >39)
LDL Chol Calc (NIH): 179 mg/dL — ABNORMAL HIGH (ref 0–99)
Triglycerides: 152 mg/dL — ABNORMAL HIGH (ref 0–149)
VLDL Cholesterol Cal: 27 mg/dL (ref 5–40)

## 2023-01-06 LAB — CMP14+EGFR
ALT: 21 IU/L (ref 0–32)
AST: 19 IU/L (ref 0–40)
Albumin/Globulin Ratio: 1.8 (ref 1.2–2.2)
Albumin: 4.4 g/dL (ref 3.8–4.9)
Alkaline Phosphatase: 106 IU/L (ref 44–121)
BUN/Creatinine Ratio: 19 (ref 9–23)
BUN: 18 mg/dL (ref 6–24)
Bilirubin Total: 0.3 mg/dL (ref 0.0–1.2)
CO2: 24 mmol/L (ref 20–29)
Calcium: 10 mg/dL (ref 8.7–10.2)
Chloride: 101 mmol/L (ref 96–106)
Creatinine, Ser: 0.95 mg/dL (ref 0.57–1.00)
Globulin, Total: 2.4 g/dL (ref 1.5–4.5)
Glucose: 88 mg/dL (ref 70–99)
Potassium: 4.2 mmol/L (ref 3.5–5.2)
Sodium: 140 mmol/L (ref 134–144)
Total Protein: 6.8 g/dL (ref 6.0–8.5)
eGFR: 72 mL/min/{1.73_m2} (ref >59)

## 2023-01-10 DIAGNOSIS — F3342 Major depressive disorder, recurrent, in full remission: Secondary | ICD-10-CM | POA: Diagnosis not present

## 2023-01-11 NOTE — Progress Notes (Signed)
Pt r/c.

## 2023-01-14 ENCOUNTER — Ambulatory Visit: Payer: 59

## 2023-01-15 ENCOUNTER — Other Ambulatory Visit: Payer: Self-pay | Admitting: *Deleted

## 2023-01-15 MED ORDER — OMEGA-3 FATTY ACIDS 1000 MG PO CAPS: 2.0000 g | ORAL_CAPSULE | Freq: Every day | ORAL | 3 refills | Status: DC

## 2023-01-15 MED ORDER — ROSUVASTATIN CALCIUM 5 MG PO TABS: 5.0000 mg | ORAL_TABLET | Freq: Every day | ORAL | 3 refills | Status: DC

## 2023-01-15 NOTE — Progress Notes (Signed)
Patient r/c [phone]

## 2023-01-19 DIAGNOSIS — J029 Acute pharyngitis, unspecified: Secondary | ICD-10-CM | POA: Diagnosis not present

## 2023-01-25 ENCOUNTER — Encounter: Payer: Self-pay | Admitting: Family Medicine

## 2023-02-05 ENCOUNTER — Ambulatory Visit: Payer: 59

## 2023-02-07 ENCOUNTER — Telehealth: Payer: Self-pay | Admitting: Family Medicine

## 2023-02-07 NOTE — Telephone Encounter (Signed)
Pt says that since starting taking rosuvastatin (CRESTOR) 5 MG tablet, her feet are swollen. Pt said she is going to stop taking till she hears back from [name]. Please call back

## 2023-02-08 ENCOUNTER — Inpatient Hospital Stay: Payer: BC Managed Care – PPO | Attending: Hematology

## 2023-02-08 VITALS — BP 121/96 | HR 86 | Temp 98.1°F | Resp 18

## 2023-02-08 DIAGNOSIS — Z79818 Long term (current) use of other agents affecting estrogen receptors and estrogen levels: Secondary | ICD-10-CM | POA: Diagnosis not present

## 2023-02-08 DIAGNOSIS — Z170 Estrogen receptor positive status [ER+]: Secondary | ICD-10-CM | POA: Insufficient documentation

## 2023-02-08 DIAGNOSIS — C50911 Malignant neoplasm of unspecified site of right female breast: Secondary | ICD-10-CM | POA: Diagnosis not present

## 2023-02-08 DIAGNOSIS — C50912 Malignant neoplasm of unspecified site of left female breast: Secondary | ICD-10-CM

## 2023-02-08 MED ORDER — LEUPROLIDE ACETATE (3 MONTH) 11.25 MG IM KIT
11.2500 mg | PACK | Freq: Once | INTRAMUSCULAR | Status: AC
  Administered 2023-02-08: 11.25 mg via INTRAMUSCULAR
  Filled 2023-02-08: qty 11.25

## 2023-02-08 NOTE — Progress Notes (Signed)
Patient tolerated injection with no complaints voiced. Site clean and dry with no bruising or swelling noted at site. See MAR for details. Band aid applied.  Patient stable during and after injection. VSS with discharge and left in satisfactory condition with no s/s of distress noted.  

## 2023-02-08 NOTE — Patient Instructions (Signed)
[facility]  Discharge Instructions: Thank you for choosing [facility] to provide your oncology and hematology care.  If you have a lab appointment with the Cancer Center - please note that after April 8th, 2024, all labs will be drawn in the cancer center.  You do not have to check in or register with the main entrance as you have in the past but will complete your check-in in the cancer center.  Wear comfortable clothing and clothing appropriate for easy access to any Portacath or PICC line.   We strive to give you quality time with your provider. You may need to reschedule your appointment if you arrive late (15 or more minutes).  Arriving late affects you and other patients whose appointments are after yours.  Also, if you miss three or more appointments without notifying the office, you may be dismissed from the clinic at the provider's discretion.      For prescription refill requests, have your pharmacy contact our office and allow 72 hours for refills to be completed.    Today you received the following Lupron, return as scheduled.   To help prevent nausea and vomiting after your treatment, we encourage you to take your nausea medication as directed.  BELOW ARE SYMPTOMS THAT SHOULD BE REPORTED IMMEDIATELY: *FEVER GREATER THAN 100.4 F (38 C) OR HIGHER *CHILLS OR SWEATING *NAUSEA AND VOMITING THAT IS NOT CONTROLLED WITH YOUR NAUSEA MEDICATION *UNUSUAL SHORTNESS OF BREATH *UNUSUAL BRUISING OR BLEEDING *URINARY PROBLEMS (pain or burning when urinating, or frequent urination) *BOWEL PROBLEMS (unusual diarrhea, constipation, pain near the anus) TENDERNESS IN MOUTH AND THROAT WITH OR WITHOUT PRESENCE OF ULCERS (sore throat, sores in mouth, or a toothache) UNUSUAL RASH, SWELLING OR PAIN  UNUSUAL VAGINAL DISCHARGE OR ITCHING   Items with * indicate a potential emergency and should be followed up as soon as possible or go to the Emergency Department if  any problems should occur.  Please show the CHEMOTHERAPY ALERT CARD or IMMUNOTHERAPY ALERT CARD at check-in to the Emergency Department and triage nurse.  Should you have questions after your visit or need to cancel or reschedule your appointment, please contact [facility] AT [facility] [phone]  and follow the prompts.  Office hours are 8:00 a.m. to 4:30 p.m. Monday - Friday. Please note that voicemails left after 4:00 p.m. may not be returned until the following business day.  We are closed weekends and major holidays. You have access to a nurse at all times for urgent questions. Please call the main number to the clinic [phone] and follow the prompts.  For any non-urgent questions, you may also contact your provider using MyChart. We now offer e-Visits for anyone 41 and older to request care online for non-urgent symptoms. For details visit [email or web address].   Also download the MyChart app! Go to the app store, search "MyChart", open the app, select [facility], and log in with your MyChart username and password.

## 2023-02-08 NOTE — Telephone Encounter (Signed)
Left detailed vm - advised to call back with questions

## 2023-02-08 NOTE — Telephone Encounter (Signed)
Crestor does not really cause swelling in the legs so I do not think it is from that but you can come off of it for 2 weeks see what happens and then restart it after 2 weeks

## 2023-02-09 ENCOUNTER — Encounter (HOSPITAL_COMMUNITY): Payer: Self-pay | Admitting: Hematology

## 2023-02-13 ENCOUNTER — Ambulatory Visit: Payer: Medicaid Other | Admitting: Nurse Practitioner

## 2023-02-13 ENCOUNTER — Encounter: Payer: Self-pay | Admitting: Nurse Practitioner

## 2023-02-13 VITALS — BP 120/85 | HR 81 | Temp 98.0°F | Resp 20 | Ht 66.0 in | Wt 193.0 lb

## 2023-02-13 DIAGNOSIS — R600 Localized edema: Secondary | ICD-10-CM

## 2023-02-13 MED ORDER — FUROSEMIDE 20 MG PO TABS: 20.0000 mg | ORAL_TABLET | Freq: Every day | ORAL | 3 refills | Status: DC

## 2023-02-13 NOTE — Patient Instructions (Signed)
Peripheral Edema  Peripheral edema is swelling that is caused by a buildup of fluid. Peripheral edema most often affects the lower legs, ankles, and feet. It can also develop in the arms, hands, and face. The area of the body that has peripheral edema will look swollen. It may also feel heavy or warm. Your clothes may start to feel tight. Pressing on the area may make a temporary dent in your skin (pitting edema). You may not be able to move your swollen arm or leg as much as usual. There are many causes of peripheral edema. It can happen because of a complication of other conditions such as heart failure, kidney disease, or a problem with your circulation. It also can be a side effect of certain medicines or happen because of an infection. It often happens to women during pregnancy. Sometimes, the cause is not known. Follow these instructions at home: Managing pain, stiffness, and swelling  Raise (elevate) your legs while you are sitting or lying down. Move around often to prevent stiffness and to reduce swelling. Do not sit or stand for long periods of time. Do not wear tight clothing. Do not wear garters on your upper legs. Exercise your legs to get your circulation going. This helps to move the fluid back into your blood vessels, and it may help the swelling go down. Wear compression stockings as told by your health care provider. These stockings help to prevent blood clots and reduce swelling in your legs. It is important that these are the correct size. These stockings should be prescribed by your doctor to prevent possible injuries. If elastic bandages or wraps are recommended, use them as told by your health care provider. Medicines Take over-the-counter and prescription medicines only as told by your health care provider. Your health care provider may prescribe medicine to help your body get rid of excess water (diuretic). Take this medicine if you are told to take it. General  instructions Eat a low-salt (low-sodium) diet as told by your health care provider. Sometimes, eating less salt may reduce swelling. Pay attention to any changes in your symptoms. Moisturize your skin daily to help prevent skin from cracking and draining. Keep all follow-up visits. This is important. Contact a health care provider if: You have a fever. You have swelling in only one leg. You have increased swelling, redness, or pain in one or both of your legs. You have drainage or sores at the area where you have edema. Get help right away if: You have edema that starts suddenly or is getting worse, especially if you are pregnant or have a medical condition. You develop shortness of breath, especially when you are lying down. You have pain in your chest or abdomen. You feel weak. You feel like you will faint. These symptoms may be an emergency. Get help right away. Call 911. Do not wait to see if the symptoms will go away. Do not drive yourself to the hospital. Summary Peripheral edema is swelling that is caused by a buildup of fluid. Peripheral edema most often affects the lower legs, ankles, and feet. Move around often to prevent stiffness and to reduce swelling. Do not sit or stand for long periods of time. Pay attention to any changes in your symptoms. Contact a health care provider if you have edema that starts suddenly or is getting worse, especially if you are pregnant or have a medical condition. Get help right away if you develop shortness of breath, especially when lying down.   This information is not intended to replace advice given to you by your health care provider. Make sure you discuss any questions you have with your health care provider. Document Revised: 06/06/2021 Document Reviewed: 06/06/2021 Elsevier Patient Education  2023 Elsevier Inc.  

## 2023-02-13 NOTE — Progress Notes (Signed)
   Subjective:    Patient ID: Doris Newman, female    DOB: 1970/12/08, 52 y.o.   MRN: [id number]   Chief Complaint: cellulitis  HPI  Patient says that her left leg was swollen yesterday and was hot to touch. Is better today, but she has been sitting with it elevated today. 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  Eyes:  Negative for pain.  Respiratory:  Negative for shortness of breath.   Cardiovascular:  Negative for chest pain, palpitations and leg swelling.  Gastrointestinal:  Negative for abdominal pain.  Endocrine: Negative for polydipsia.  Skin:  Negative for rash.  Neurological:  Negative for dizziness, weakness and headaches.  Hematological:  Does not bruise/bleed easily.  All other systems reviewed and are negative.      Objective:   Physical Exam Cardiovascular:     Rate and Rhythm: Normal rate.  Pulmonary:     Effort: Pulmonary effort is normal.     Breath sounds: Normal breath sounds.  Skin:    General: Skin is warm.  Neurological:     General: No focal deficit present.     Mental Status: She is oriented to person, place, and time.  Psychiatric:        Mood and Affect: Mood normal.    BP 120/85   Pulse 81   Temp 98 F (36.7 C) (Temporal)   Resp 20   Ht 5\' 6"  (1.676 m)   Wt 193 lb (87.5 kg)   SpO2 98%   BMI 31.15 kg/m         Assessment & Plan:   Myrtice Lowdermilk in today with chief complaint of peripheral edema  1. Peripheral edema Stop HCTZ Start lasix 20mg  daily Elevate legs when sitting Compression socks. RTO prn Meds  ordered this encounter  Medications   furosemide (LASIX) 20 MG tablet    Sig: Take 1 tablet (20 mg total) by mouth daily.    Dispense:  30 tablet    Refill:  3    Order Specific Question:   Supervising Provider    Answer:   Arville Care A [[id number]]       The above assessment and management plan was discussed with the patient. The patient verbalized understanding of and has agreed to the management plan. Patient is aware to call the clinic if symptoms persist or worsen. Patient is aware when to return to the clinic for a follow-up visit. Patient educated on when it is appropriate to go to the emergency department.   Mary-Margaret Daphine Deutscher, FNP

## 2023-03-06 ENCOUNTER — Other Ambulatory Visit (HOSPITAL_COMMUNITY): Payer: BC Managed Care – PPO

## 2023-03-06 ENCOUNTER — Encounter (HOSPITAL_COMMUNITY): Payer: BC Managed Care – PPO

## 2023-03-06 ENCOUNTER — Other Ambulatory Visit: Payer: Self-pay

## 2023-03-13 ENCOUNTER — Other Ambulatory Visit (HOSPITAL_COMMUNITY): Payer: Self-pay

## 2023-03-13 ENCOUNTER — Ambulatory Visit: Payer: BC Managed Care – PPO | Admitting: Hematology

## 2023-03-13 ENCOUNTER — Other Ambulatory Visit: Payer: Self-pay

## 2023-03-13 ENCOUNTER — Encounter (HOSPITAL_COMMUNITY): Payer: Self-pay

## 2023-03-20 ENCOUNTER — Encounter (HOSPITAL_COMMUNITY): Payer: Self-pay | Admitting: Hematology

## 2023-03-20 ENCOUNTER — Inpatient Hospital Stay: Payer: BC Managed Care – PPO

## 2023-03-20 ENCOUNTER — Inpatient Hospital Stay: Payer: BC Managed Care – PPO | Attending: Hematology

## 2023-03-20 ENCOUNTER — Encounter: Payer: Self-pay | Admitting: Family Medicine

## 2023-03-20 ENCOUNTER — Ambulatory Visit (HOSPITAL_COMMUNITY)
Admission: RE | Admit: 2023-03-20 | Discharge: 2023-03-20 | Disposition: A | Discharge status: Home / Self Care | Payer: BC Managed Care – PPO | Source: Ambulatory Visit | Attending: Hematology | Admitting: Hematology

## 2023-03-20 ENCOUNTER — Ambulatory Visit: Payer: BC Managed Care – PPO | Admitting: Family Medicine

## 2023-03-20 ENCOUNTER — Encounter (HOSPITAL_COMMUNITY): Payer: Self-pay

## 2023-03-20 VITALS — BP 119/81 | HR 86 | Temp 98.8°F | Ht 66.0 in | Wt 188.0 lb

## 2023-03-20 DIAGNOSIS — Z8042 Family history of malignant neoplasm of prostate: Secondary | ICD-10-CM | POA: Diagnosis not present

## 2023-03-20 DIAGNOSIS — C50919 Malignant neoplasm of unspecified site of unspecified female breast: Secondary | ICD-10-CM | POA: Insufficient documentation

## 2023-03-20 DIAGNOSIS — Z806 Family history of leukemia: Secondary | ICD-10-CM | POA: Insufficient documentation

## 2023-03-20 DIAGNOSIS — R638 Other symptoms and signs concerning food and fluid intake: Secondary | ICD-10-CM | POA: Diagnosis not present

## 2023-03-20 DIAGNOSIS — Z853 Personal history of malignant neoplasm of breast: Secondary | ICD-10-CM | POA: Diagnosis not present

## 2023-03-20 DIAGNOSIS — I8390 Asymptomatic varicose veins of unspecified lower extremity: Secondary | ICD-10-CM

## 2023-03-20 DIAGNOSIS — Z79811 Long term (current) use of aromatase inhibitors: Secondary | ICD-10-CM | POA: Insufficient documentation

## 2023-03-20 DIAGNOSIS — Z803 Family history of malignant neoplasm of breast: Secondary | ICD-10-CM | POA: Diagnosis not present

## 2023-03-20 DIAGNOSIS — Z170 Estrogen receptor positive status [ER+]: Secondary | ICD-10-CM | POA: Diagnosis not present

## 2023-03-20 DIAGNOSIS — Z9221 Personal history of antineoplastic chemotherapy: Secondary | ICD-10-CM | POA: Insufficient documentation

## 2023-03-20 DIAGNOSIS — T451X5A Adverse effect of antineoplastic and immunosuppressive drugs, initial encounter: Secondary | ICD-10-CM

## 2023-03-20 DIAGNOSIS — C50912 Malignant neoplasm of unspecified site of left female breast: Secondary | ICD-10-CM

## 2023-03-20 DIAGNOSIS — R232 Flushing: Secondary | ICD-10-CM | POA: Diagnosis not present

## 2023-03-20 DIAGNOSIS — I10 Essential (primary) hypertension: Secondary | ICD-10-CM | POA: Diagnosis not present

## 2023-03-20 DIAGNOSIS — R600 Localized edema: Secondary | ICD-10-CM

## 2023-03-20 DIAGNOSIS — Z79899 Other long term (current) drug therapy: Secondary | ICD-10-CM | POA: Insufficient documentation

## 2023-03-20 DIAGNOSIS — C50911 Malignant neoplasm of unspecified site of right female breast: Secondary | ICD-10-CM | POA: Diagnosis not present

## 2023-03-20 DIAGNOSIS — K219 Gastro-esophageal reflux disease without esophagitis: Secondary | ICD-10-CM | POA: Insufficient documentation

## 2023-03-20 LAB — COMPREHENSIVE METABOLIC PANEL
ALT: 31 U/L (ref 0–44)
AST: 29 U/L (ref 15–41)
Albumin: 4.1 g/dL (ref 3.5–5.0)
Alkaline Phosphatase: 95 U/L (ref 38–126)
Anion gap: 8 (ref 5–15)
BUN: 15 mg/dL (ref 6–20)
CO2: 28 mmol/L (ref 22–32)
Calcium: 9.3 mg/dL (ref 8.9–10.3)
Chloride: 102 mmol/L (ref 98–111)
Creatinine, Ser: 0.89 mg/dL (ref 0.44–1.00)
GFR, Estimated: >60 mL/min (ref >60)
Glucose, Bld: 106 mg/dL — ABNORMAL HIGH (ref 70–99)
Potassium: 3.8 mmol/L (ref 3.5–5.1)
Sodium: 138 mmol/L (ref 135–145)
Total Bilirubin: 0.4 mg/dL (ref 0.3–1.2)
Total Protein: 7.5 g/dL (ref 6.5–8.1)

## 2023-03-20 LAB — CBC WITH DIFFERENTIAL/PLATELET
Abs Immature Granulocytes: 0.01 10*3/uL (ref 0.00–0.07)
Basophils Absolute: 0 10*3/uL (ref 0.0–0.1)
Basophils Relative: 0 %
Eosinophils Absolute: 0.2 10*3/uL (ref 0.0–0.5)
Eosinophils Relative: 4 %
HCT: 38.3 % (ref 36.0–46.0)
Hemoglobin: 12.6 g/dL (ref 12.0–15.0)
Immature Granulocytes: 0 %
Lymphocytes Relative: 28 %
Lymphs Abs: 1.3 10*3/uL (ref 0.7–4.0)
MCH: 30.3 pg (ref 26.0–34.0)
MCHC: 32.9 g/dL (ref 30.0–36.0)
MCV: 92.1 fL (ref 80.0–100.0)
Monocytes Absolute: 0.4 10*3/uL (ref 0.1–1.0)
Monocytes Relative: 8 %
Neutro Abs: 2.8 10*3/uL (ref 1.7–7.7)
Neutrophils Relative %: 60 %
Platelets: 298 10*3/uL (ref 150–400)
RBC: 4.16 MIL/uL (ref 3.87–5.11)
RDW: 13.3 % (ref 11.5–15.5)
WBC: 4.7 10*3/uL (ref 4.0–10.5)
nRBC: 0 % (ref 0.0–0.2)

## 2023-03-20 LAB — VITAMIN D 25 HYDROXY (VIT D DEFICIENCY, FRACTURES): Vit D, 25-Hydroxy: 38.48 ng/mL (ref 30–100)

## 2023-03-20 MED ORDER — FUROSEMIDE 40 MG PO TABS
40.0000 mg | ORAL_TABLET | Freq: Every day | ORAL | 0 refills | Status: DC
Start: 2023-03-20 — End: 2023-07-25

## 2023-03-20 NOTE — Patient Instructions (Addendum)
Lasix increased to 40mg .  We talked about salt intake:  Wash canned vegetables Avoid frozen foods (lean cuisines, etc) as they have a ton of salt Fast/ prepared foods have a lot of salt.  Try and meal plan/ fresh foods. Avoid soda (even diet soda) because they have a ton of salt.  Edema  Edema is when you have too much fluid in your body or under your skin. Edema may make your legs, feet, and ankles swell. Swelling often happens in looser tissues, such as around your eyes. This is a common condition. It gets more common as you get older. There are many possible causes of edema. These include: Eating too much salt (sodium). Being on your feet or sitting for a long time. Certain medical conditions, such as: Pregnancy. Heart failure. Liver disease. Kidney disease. Cancer. Hot weather may make edema worse. Edema is usually painless. Your skin may look swollen or shiny. Follow these instructions at home: Medicines Take over-the-counter and prescription medicines only as told by your doctor. Your doctor may prescribe a medicine to help your body get rid of extra water (diuretic). Take this medicine if you are told to take it. Eating and drinking Eat a low-salt (low-sodium) diet as told by your doctor. Sometimes, eating less salt may reduce swelling. Depending on the cause of your swelling, you may need to limit how much fluid you drink (fluid restriction). General instructions Raise the injured area above the level of your heart while you are sitting or lying down. Do not sit still or stand for a long time. Do not wear tight clothes. Do not wear garters on your upper legs. Exercise your legs. This can help the swelling go down. Wear compression stockings as told by your doctor. It is important that these are the right size. These should be prescribed by your doctor to prevent possible injuries. If elastic bandages or wraps are recommended, use them as told by your doctor. Contact a doctor  if: Treatment is not working. You have heart, liver, or kidney disease and have symptoms of edema. You have sudden and unexplained weight gain. Get help right away if: You have shortness of breath or chest pain. You cannot breathe when you lie down. You have pain, redness, or warmth in the swollen areas. You have heart, liver, or kidney disease and get edema all of a sudden. You have a fever and your symptoms get worse all of a sudden. These symptoms may be an emergency. Get help right away. Call 911. Do not wait to see if the symptoms will go away. Do not drive yourself to the hospital. Summary Edema is when you have too much fluid in your body or under your skin. Edema may make your legs, feet, and ankles swell. Swelling often happens in looser tissues, such as around your eyes. Raise the injured area above the level of your heart while you are sitting or lying down. Follow your doctor's instructions about diet and how much fluid you can drink. This information is not intended to replace advice given to you by your health care provider. Make sure you discuss any questions you have with your health care provider. Document Revised: 06/06/2021 Document Reviewed: 06/06/2021 Elsevier Patient Education  2024 [employer].

## 2023-03-20 NOTE — Progress Notes (Signed)
Subjective: CC: Peripheral edema PCP: [name], Elige Radon, MD [id number] [name] is a 52 y.o. female presenting to clinic today for:  1.  Peripheral edema Patient reports this has been ongoing since February when she returned to work.  She was seen in April and switched off of HCTZ onto Lasix 20 mg.  She has noticed no difference with the change in diuretic therapy.  She admits that this seems to be most prominent when she is on her feet for prolonged amounts of time.  She admits to quite a bit of salt intake including fast food.  She has also utilized tobacco products intermittently.  She denies any orthopnea.  Sometimes she becomes fatigued with ambulation up steps.  Edema seems to be improved after she wakes up each morning but gets worse again in the afternoons.  She does not utilize compression hose even though she has some at home because she does not like the way they look with her shorts at work.  2.  Hot flashes Patient reports that she has been experiencing hot flashes ever since treatment for breast cancer.  This has been refractory to the gabapentin that was started by her specialist.  She denies having had peripheral edema after initiation of gabapentin 6 months ago.  She is also on SNRI with no improvement in hot flashes.  Currently treated with Arimidex   ROS: Per HPI  No Known Allergies Past Medical History:  Diagnosis Date   Anxiety    Family history of adverse reaction to anesthesia    PONV   Family history of breast cancer    Family history of leukemia    Family history of prostate cancer    GERD (gastroesophageal reflux disease)    Hypertension    Personal history of radiation therapy     Current Outpatient Medications:    ALPRAZolam (XANAX) 1 MG tablet, Take 1 mg by mouth 4 (four) times daily., Disp: , Rfl:    anastrozole (ARIMIDEX) 1 MG tablet, TAKE 1 TABLET BY MOUTH  DAILY, Disp: 90 tablet, Rfl: 3   enalapril (VASOTEC) 20 MG tablet, Take 1 tablet (20 mg  total) by mouth daily., Disp: 90 tablet, Rfl: 3   fish oil-omega-3 fatty acids 1000 MG capsule, Take 2 capsules (2 g total) by mouth daily., Disp: 180 capsule, Rfl: 3   fluticasone (FLONASE) 50 MCG/ACT nasal spray, Place 1 spray into both nostrils 2 (two) times daily as needed for allergies or rhinitis., Disp: 48 g, Rfl: 1   furosemide (LASIX) 20 MG tablet, Take 1 tablet (20 mg total) by mouth daily., Disp: 30 tablet, Rfl: 3   gabapentin (NEURONTIN) 300 MG capsule, Take 2 capsules (600 mg total) by mouth at bedtime., Disp: 60 capsule, Rfl: 6   mirtazapine (REMERON) 15 MG tablet, Take 15 mg by mouth at bedtime as needed (sleep). , Disp: , Rfl:    naproxen sodium (ALEVE) 220 MG tablet, Take 440 mg by mouth daily as needed (pain). , Disp: , Rfl:    omeprazole (PRILOSEC) 20 MG capsule, Take 1 capsule (20 mg total) by mouth daily., Disp: 90 capsule, Rfl: 3   propranolol (INDERAL) 20 MG tablet, Take 20 mg by mouth daily. , Disp: , Rfl:    rizatriptan (MAXALT) 5 MG tablet, Take 1 tablet (5 mg total) by mouth as needed for migraine. May repeat in 2 hours if needed, Disp: 10 tablet, Rfl: 5   rosuvastatin (CRESTOR) 5 MG tablet, Take 1 tablet (5 mg total) by  mouth daily., Disp: 90 tablet, Rfl: 3   venlafaxine XR (EFFEXOR-XR) 37.5 MG 24 hr capsule, Take 1 capsule (37.5 mg total) by mouth daily with breakfast. Take 1 capsule for 1 week, then take 2 capsules daily., Disp: 60 capsule, Rfl: 3 No current facility-administered medications for this visit.  Facility-Administered Medications Ordered in Other Visits:    leuprolide (LUPRON) injection 11.25 mg, 11.25 mg, Intramuscular, Once, Doreatha Massed, MD Social History   Socioeconomic History   Marital status: Widowed    Spouse name: Not on file   Number of children: 1   Years of education: Not on file   Highest education level: GED or equivalent  Occupational History   Not on file  Tobacco Use   Smoking status: Some Days    Packs/day: 0.50     Years: 25.00    Additional pack years: 0.00    Total pack years: 12.50    Types: Cigarettes   Smokeless tobacco: Never  Vaping Use   Vaping Use: Never used  Substance and Sexual Activity   Alcohol use: Yes    Comment: occasional   Drug use: No   Sexual activity: Not Currently  Other Topics Concern   Not on file  Social History Narrative   Not on file   Social Determinants of Health   Financial Resource Strain: Low Risk  (03/25/2020)   Overall Financial Resource Strain (CARDIA)    Difficulty of Paying Living Expenses: Not very hard  Food Insecurity: No Food Insecurity (03/25/2020)   Hunger Vital Sign    Worried About Running Out of Food in the Last Year: Never true    Ran Out of Food in the Last Year: Never true  Transportation Needs: No Transportation Needs (02/15/2021)   PRAPARE - Administrator, Civil Service (Medical): No    Lack of Transportation (Non-Medical): No  Physical Activity: Insufficiently Active (03/25/2020)   Exercise Vital Sign    Days of Exercise per Week: 2 days    Minutes of Exercise per Session: 20 min  Stress: Stress Concern Present (03/25/2020)   [employer] of Occupational Health - Occupational Stress Questionnaire    Feeling of Stress : To some extent  Social Connections: Moderately Integrated (03/25/2020)   Social Connection and Isolation Panel [NHANES]    Frequency of Communication with Friends and Family: More than three times a week    Frequency of Social Gatherings with Friends and Family: More than three times a week    Attends Religious Services: More than 4 times per year    Active Member of [employer] or Organizations: No    Attends Engineer, structural: More than 4 times per year    Marital Status: Widowed  Intimate Partner Violence: Not At Risk (03/25/2020)   Humiliation, Afraid, Rape, and Kick questionnaire    Fear of Current or Ex-Partner: No    Emotionally Abused: No    Physically Abused: No    Sexually Abused: No    Family History  Problem Relation Age of Onset   Prostate cancer Father        dx. in his late 50s/early 83s   Breast cancer Paternal Grandmother        dx. in her 75s   Fibromyalgia Sister    Leukemia Paternal Grandfather        dx. in his 30s    Objective: Office vital signs reviewed. BP 119/81   Pulse 86   Temp 98.8 F (37.1 C)  Ht 5\' 6"  (1.676 m)   Wt 188 lb (85.3 kg)   LMP 02/29/2020   SpO2 96%   BMI 30.34 kg/m   Physical Examination:  General: Awake, alert, nontoxic female, No acute distress HEENT: sclera white, MMM.  No exophthalmos.  No goiter Cardio: regular rate and rhythm, S1S2 heard, no murmurs appreciated Pulm: clear to auscultation bilaterally, no wheezes, rhonchi or rales; normal work of breathing on room air Extremities: warm, well perfused, trace to +1 nonpitting ankle edema, no cyanosis or clubbing; +2 pulses bilaterally.  Varicose veins noted along medial ankles bilaterally left greater than right    Assessment/ Plan: 52 y.o. female   Bilateral lower extremity edema - Plan: furosemide (LASIX) 40 MG tablet  Excessive chloride salt intake  Varicose veins of ankle  Hot flashes related to aromatase inhibitor therapy  Suspect secondary to venous stasis, excess salt intake.  I will advance diuretic to 40 mg daily.  I recommend reevaluation in 3 weeks with PCP.  Consider BMP at that time.  I suspect that she may be able to avoid oral diuretics going forward if she can eliminate excess salt from her diet.  Her exam was notable for varicose veins.  May benefit from referral to vascular surgery if symptoms are not improved by diet and lifestyle modification.  Encouraged use of compression hose but she was reluctant to use this for cosmetic reasons.  Discussed impact of tobacco use on perfusion of the legs.  Continues to have hot flashes, likely secondary to treatment for breast cancer.  These have been refractory to Effexor and gabapentin.  Since gabapentin  can increase peripheral edema I wonder if perhaps we can transition her from the gabapentin to a product like Veozah.  I will reach out to her oncologist to see if this is an option for her  No orders of the defined types were placed in this encounter.  No orders of the defined types were placed in this encounter.    Doris Ip, DO [facility] [phone]

## 2023-03-22 ENCOUNTER — Ambulatory Visit: Payer: Self-pay | Admitting: Hematology

## 2023-03-28 NOTE — Progress Notes (Signed)
[facility] [address] [place] [address]    Clinic Day:  03/29/2023  Referring physician: [name], Elige Radon, MD  Patient Care Team: [name], Elige Radon, MD as PCP - General (Family Medicine) Mickie Bail, RN as Oncology Nurse Navigator (Oncology) Doreatha Massed, MD as Medical Oncologist (Oncology)   ASSESSMENT & PLAN:   Assessment: 1.  Stage Ib (T2N0) left breast IDC: -Right breast lump was palp[facility] at health department.  Bilateral mammogram and ultrasound on 02/04/2020 at [facility] showed 1.8 x 1.3 x 2.1 cm irregular hypoechoic mass at the 2 o'clock position 3 to 4 cm from nipple. -Ultrasound-guided biopsy on 02/04/2020 showed invasive ductal carcinoma, grade 2. -Left lumpectomy after needle localization and SLNB by Dr. Henreitta Leber on 03/08/2020. -Pathology-2.1 cm IDC, grade 1, 0/3 lymph nodes positive, PT 2 PN 0, ER 95%, PR to 70%, HER-2 negative, Ki-67 10%.  Invasive carcinoma is broadly less than 0.1 cm to the medial margin.  Reexcision margins were negative. -Oncotype DX recurrence score 16, distant recurrence risk at 9 years with tamoxifen/AI of 4%, absolute chemotherapy benefit less than 1%. -XRT to the left breast started on 05/04/2020 and finished in August 2021. -Lupron started on 05/31/2020 along with anastrozole.   2.  Family history: -Paternal grandmother had breast cancer. -Father had prostate cancer.    Plan: 1.  Stage I left breast IDC: - She is tolerating Lupron very well other than hot flashes. - She is also tolerating anastrozole very well. - Mammogram on 03/20/2023: BI-RADS Category 2. - Labs on 03/20/2023: CBC normal.  LFTs normal. - Continue anastrozole daily. - She complained of lower back pain since last visit to 6 months ago.  This has not gotten any better.  She has previous history of arthritis.  We will obtain an x-ray of her lumbar spine.  Dr. Louanne Skye is arranging her to get injections in the back. - She will come back in 6  months for follow-up.   2.  Hot flashes: - She is taking Effexor 75 mg daily and gabapentin 300 mg at bedtime.  She still continues to have hot flashes. - We will start her Veozah 45 mg daily.  If she is able to get Veozah through her insurance, she will discontinue gabapentin as it is causing leg swellings.   3.  Bone health (DEXA 02/28/2022 T-score -0.7): - Vitamin D level is 38.  Continue calcium and vitamin D supplements.    Orders Placed This Encounter  Procedures   DG Lumbar Spine 2-3 Views    Standing Status:   Future    Number of Occurrences:   1    Standing Expiration Date:   03/28/2024    Order Specific Question:   Reason for Exam (SYMPTOM  OR DIAGNOSIS REQUIRED)    Answer:   lower back pain    Order Specific Question:   Is patient pregnant?    Answer:   No    Order Specific Question:   Preferred imaging location?    Answer:   [facility]      I,Katie Daubenspeck,acting as a scribe for Doreatha Massed, MD.,have documented all relevant documentation on the behalf of Doreatha Massed, MD,as directed by  Doreatha Massed, MD while in the presence of Doreatha Massed, MD.   I, Doreatha Massed MD, have reviewed the above documentation for accuracy and completeness, and I agree with the above.   Doreatha Massed, MD   6/13/20246:36 PM  CHIEF COMPLAINT:   Diagnosis:  left breast invasive ductal carcinoma    Cancer Staging  Invasive ductal carcinoma of left breast (HCC) Staging form: Breast, AJCC 8th Edition - Clinical stage from 03/25/2020: Stage IB (cT2, cN0(sn), cM0, G1, ER+, PR+, HER2-) - Unsigned    Prior Therapy: 1. Left lumpectomy and SLNB, 03/08/20 2. XRT to left breast 05/04/20 - 05/2020  Current Therapy:  Lupron + anastrozole   HISTORY OF PRESENT ILLNESS:   Oncology History  Invasive ductal carcinoma of left breast (HCC)  02/19/2020 Initial Diagnosis   Invasive ductal carcinoma of left breast (HCC)   04/06/2020 Genetic Testing    Oncotype DX Breast Recurrence Score Report         INTERVAL HISTORY:   Persayis is a 52 y.o. female presenting to clinic today for follow up of left breast invasive ductal carcinoma. She was last seen by me on 09/06/22.  Since her last visit, she had annual mammogram on 03/20/23 showing no evidence of malignancy.  Today, she states that she is doing well overall. Her appetite level is at 100%. Her energy level is at 75%.  PAST MEDICAL HISTORY:   Past Medical History: Past Medical History:  Diagnosis Date   Anxiety    Family history of adverse reaction to anesthesia    PONV   Family history of breast cancer    Family history of leukemia    Family history of prostate cancer    GERD (gastroesophageal reflux disease)    Hypertension    Personal history of radiation therapy     Surgical History: Past Surgical History:  Procedure Laterality Date   BREAST LUMPECTOMY Left 03/08/2020   LEEP     PARTIAL MASTECTOMY WITH NEEDLE LOCALIZATION AND AXILLARY SENTINEL LYMPH NODE BX Left 03/08/2020   Ductal carcinoma in situ with calcifications, intermediate grade.  - Lobular neoplasia (atypical lobular hyperplasia).  - Invasive carcinoma is broadly less than 0.1 cm to the medial margin of  specimen A.    Social History: Social History   Socioeconomic History   Marital status: Widowed    Spouse name: Not on file   Number of children: 1   Years of education: Not on file   Highest education level: GED or equivalent  Occupational History   Not on file  Tobacco Use   Smoking status: Some Days    Packs/day: 0.50    Years: 25.00    Additional pack years: 0.00    Total pack years: 12.50    Types: Cigarettes   Smokeless tobacco: Never  Vaping Use   Vaping Use: Never used  Substance and Sexual Activity   Alcohol use: Yes    Comment: occasional   Drug use: No   Sexual activity: Not Currently  Other Topics Concern   Not on file  Social History Narrative   Not on file   Social  Determinants of Health   Financial Resource Strain: Low Risk  (03/25/2020)   Overall Financial Resource Strain (CARDIA)    Difficulty of Paying Living Expenses: Not very hard  Food Insecurity: No Food Insecurity (03/25/2020)   Hunger Vital Sign    Worried About Running Out of Food in the Last Year: Never true    Ran Out of Food in the Last Year: Never true  Transportation Needs: No Transportation Needs (02/15/2021)   PRAPARE - Administrator, Civil Service (Medical): No    Lack of Transportation (Non-Medical): No  Physical Activity: Insufficiently Active (03/25/2020)   Exercise Vital Sign  Days of Exercise per Week: 2 days    Minutes of Exercise per Session: 20 min  Stress: Stress Concern Present (03/25/2020)   [employer] of Occupational Health - Occupational Stress Questionnaire    Feeling of Stress : To some extent  Social Connections: Moderately Integrated (03/25/2020)   Social Connection and Isolation Panel [NHANES]    Frequency of Communication with Friends and Family: More than three times a week    Frequency of Social Gatherings with Friends and Family: More than three times a week    Attends Religious Services: More than 4 times per year    Active Member of [employer] or Organizations: No    Attends Engineer, structural: More than 4 times per year    Marital Status: Widowed  Intimate Partner Violence: Not At Risk (03/25/2020)   Humiliation, Afraid, Rape, and Kick questionnaire    Fear of Current or Ex-Partner: No    Emotionally Abused: No    Physically Abused: No    Sexually Abused: No    Family History: Family History  Problem Relation Age of Onset   Prostate cancer Father        dx. in his late 50s/early 57s   Breast cancer Paternal Grandmother        dx. in her 43s   Fibromyalgia Sister    Leukemia Paternal Grandfather        dx. in his 30s    Current Medications:  Current Outpatient Medications:    ALPRAZolam (XANAX) 1 MG tablet, Take 1  mg by mouth 4 (four) times daily., Disp: , Rfl:    anastrozole (ARIMIDEX) 1 MG tablet, TAKE 1 TABLET BY MOUTH  DAILY, Disp: 90 tablet, Rfl: 3   enalapril (VASOTEC) 20 MG tablet, Take 1 tablet (20 mg total) by mouth daily., Disp: 90 tablet, Rfl: 3   Fezolinetant (VEOZAH) 45 MG TABS, Take 1 tablet (45 mg total) by mouth daily., Disp: 90 tablet, Rfl: 3   fluticasone (FLONASE) 50 MCG/ACT nasal spray, Place 1 spray into both nostrils 2 (two) times daily as needed for allergies or rhinitis., Disp: 48 g, Rfl: 1   furosemide (LASIX) 40 MG tablet, Take 1 tablet (40 mg total) by mouth daily. For leg edema, Disp: 90 tablet, Rfl: 0   gabapentin (NEURONTIN) 300 MG capsule, Take 2 capsules (600 mg total) by mouth at bedtime., Disp: 60 capsule, Rfl: 6   mirtazapine (REMERON) 15 MG tablet, Take 15 mg by mouth at bedtime as needed (sleep). , Disp: , Rfl:    naproxen sodium (ALEVE) 220 MG tablet, Take 440 mg by mouth daily as needed (pain). , Disp: , Rfl:    omeprazole (PRILOSEC) 20 MG capsule, Take 1 capsule (20 mg total) by mouth daily., Disp: 90 capsule, Rfl: 3   propranolol (INDERAL) 20 MG tablet, Take 20 mg by mouth daily. , Disp: , Rfl:    rizatriptan (MAXALT) 5 MG tablet, Take 1 tablet (5 mg total) by mouth as needed for migraine. May repeat in 2 hours if needed, Disp: 10 tablet, Rfl: 5   rosuvastatin (CRESTOR) 5 MG tablet, Take 1 tablet (5 mg total) by mouth daily., Disp: 90 tablet, Rfl: 3   venlafaxine XR (EFFEXOR-XR) 37.5 MG 24 hr capsule, Take 1 capsule (37.5 mg total) by mouth daily with breakfast. Take 1 capsule for 1 week, then take 2 capsules daily., Disp: 60 capsule, Rfl: 3 No current facility-administered medications for this visit.  Facility-Administered Medications Ordered in Other Visits:  leuprolide (LUPRON) injection 11.25 mg, 11.25 mg, Intramuscular, Once, Doreatha Massed, MD   Allergies: No Known Allergies  REVIEW OF SYSTEMS:   Review of Systems  Constitutional:  Negative for  chills, fatigue and fever.  HENT:   Negative for lump/mass, mouth sores, nosebleeds, sore throat and trouble swallowing.   Eyes:  Negative for eye problems.  Respiratory:  Positive for shortness of breath. Negative for cough.   Cardiovascular:  Positive for leg swelling. Negative for chest pain and palpitations.  Gastrointestinal:  Negative for abdominal pain, constipation, diarrhea, nausea and vomiting.  Genitourinary:  Negative for bladder incontinence, difficulty urinating, dysuria, frequency, hematuria and nocturia.   Musculoskeletal:  Positive for back pain. Negative for arthralgias, flank pain, myalgias and neck pain.  Skin:  Negative for itching and rash.  Neurological:  Positive for headaches and numbness. Negative for dizziness.  Hematological:  Does not bruise/bleed easily.  Psychiatric/Behavioral:  Negative for depression, sleep disturbance and suicidal ideas. The patient is not nervous/anxious.   All other systems reviewed and are negative.    VITALS:   Blood pressure (!) 115/91, pulse 84, temperature 98.2 F (36.8 C), temperature source Oral, resp. rate 16, weight 185 lb 3.2 oz (84 kg), last menstrual period 02/29/2020, SpO2 98 %.  Wt Readings from Last 3 Encounters:  03/29/23 185 lb 3.2 oz (84 kg)  03/20/23 188 lb (85.3 kg)  02/13/23 193 lb (87.5 kg)    Body mass index is 29.89 kg/m.  Performance status (ECOG): 1 - Symptomatic but completely ambulatory  PHYSICAL EXAM:   Physical Exam Vitals and nursing note reviewed. Exam conducted with a chaperone present.  Constitutional:      Appearance: Normal appearance.  Cardiovascular:     Rate and Rhythm: Normal rate and regular rhythm.     Pulses: Normal pulses.     Heart sounds: Normal heart sounds.  Pulmonary:     Effort: Pulmonary effort is normal.     Breath sounds: Normal breath sounds.  Abdominal:     Palpations: Abdomen is soft. There is no hepatomegaly, splenomegaly or mass.     Tenderness: There is no  abdominal tenderness.  Musculoskeletal:     Right lower leg: No edema.     Left lower leg: No edema.  Lymphadenopathy:     Cervical: No cervical adenopathy.     Right cervical: No superficial, deep or posterior cervical adenopathy.    Left cervical: No superficial, deep or posterior cervical adenopathy.     Upper Body:     Right upper body: No supraclavicular or axillary adenopathy.     Left upper body: No supraclavicular or axillary adenopathy.  Neurological:     General: No focal deficit present.     Mental Status: She is alert and oriented to person, place, and time.  Psychiatric:        Mood and Affect: Mood normal.        Behavior: Behavior normal.     LABS:      Latest Ref Rng & Units 03/20/2023   10:16 AM 01/05/2023    9:35 AM 08/11/2022    9:00 AM  CBC  WBC 4.0 - 10.5 K/uL 4.7  4.9  3.9   Hemoglobin 12.0 - 15.0 g/dL 16.1  09.6  04.5   Hematocrit 36.0 - 46.0 % 38.3  36.4  35.1   Platelets 150 - 400 K/uL 298  276  247       Latest Ref Rng & Units 03/20/2023   10:16  AM 01/05/2023    9:35 AM 08/11/2022    9:00 AM  CMP  Glucose 70 - 99 mg/dL 161  88  096   BUN 6 - 20 mg/dL 15  18  23    Creatinine 0.44 - 1.00 mg/dL 0.45  4.09  8.11   Sodium 135 - 145 mmol/L 138  140  138   Potassium 3.5 - 5.1 mmol/L 3.8  4.2  4.3   Chloride 98 - 111 mmol/L 102  101  101   CO2 22 - 32 mmol/L 28  24  27    Calcium 8.9 - 10.3 mg/dL 9.3  91.4  9.3   Total Protein 6.5 - 8.1 g/dL 7.5  6.8  7.0   Total Bilirubin 0.3 - 1.2 mg/dL 0.4  0.3  0.5   Alkaline Phos 38 - 126 U/L 95  106  66   AST 15 - 41 U/L 29  19  18    ALT 0 - 44 U/L 31  21  19       No results found for: "CEA1", "CEA" / No results found for: "CEA1", "CEA" No results found for: "PSA1" No results found for: "NWG956" No results found for: "CAN125"  No results found for: "TOTALPROTELP", "ALBUMINELP", "A1GS", "A2GS", "BETS", "BETA2SER", "GAMS", "MSPIKE", "SPEI" Lab Results  Component Value Date   FERRITIN 48 12/06/2015   No  results found for: "LDH"   STUDIES:   MM DIAG BREAST TOMO BILATERAL  Result Date: 03/20/2023 CLINICAL DATA:  52 year old female with history of left breast cancer post lumpectomy 03/08/2020. EXAM: DIGITAL DIAGNOSTIC BILATERAL MAMMOGRAM WITH TOMOSYNTHESIS TECHNIQUE: Bilateral digital diagnostic mammography and breast tomosynthesis was performed. COMPARISON:  Previous exam(s). ACR Breast Density Category c: The breasts are heterogeneously dense, which may obscure small masses. FINDINGS: No suspicious masses or calcifications are seen in either breast. Lumpectomy changes again identified in the upper-outer left breast. Spot compression magnification MLO view of the left breast lumpectomy site was performed. There is no mammographic evidence of locally recurrent malignancy. IMPRESSION: Stable/evolving lumpectomy changes in the left breast. No mammographic evidence of malignancy in either breast. RECOMMENDATION: 1.  Screening mammogram in one year.(Code:SM-B-01Y) 2. The patient is now two or more years post lumpectomy and therefore may return to annual screening mammography, however given history of breast cancer the patient does remain eligible for annual diagnostic mammography if indicated. I have discussed the findings and recommendations with the patient. If applicable, a reminder letter will be sent to the patient regarding the next appointment. BI-RADS CATEGORY  2: Benign. Electronically Signed   By: Edwin Cap M.D.   On: 03/20/2023 11:11

## 2023-03-29 ENCOUNTER — Ambulatory Visit (HOSPITAL_COMMUNITY)
Admission: RE | Admit: 2023-03-29 | Discharge: 2023-03-29 | Disposition: A | Discharge status: Home / Self Care | Payer: BC Managed Care – PPO | Source: Ambulatory Visit | Attending: Hematology | Admitting: Hematology

## 2023-03-29 ENCOUNTER — Inpatient Hospital Stay: Payer: BC Managed Care – PPO | Admitting: Hematology

## 2023-03-29 VITALS — BP 115/91 | HR 84 | Temp 98.2°F | Resp 16 | Wt 185.2 lb

## 2023-03-29 DIAGNOSIS — K219 Gastro-esophageal reflux disease without esophagitis: Secondary | ICD-10-CM | POA: Diagnosis not present

## 2023-03-29 DIAGNOSIS — G8929 Other chronic pain: Secondary | ICD-10-CM | POA: Diagnosis not present

## 2023-03-29 DIAGNOSIS — M5440 Lumbago with sciatica, unspecified side: Secondary | ICD-10-CM | POA: Diagnosis not present

## 2023-03-29 DIAGNOSIS — I10 Essential (primary) hypertension: Secondary | ICD-10-CM | POA: Diagnosis not present

## 2023-03-29 DIAGNOSIS — Z8042 Family history of malignant neoplasm of prostate: Secondary | ICD-10-CM | POA: Diagnosis not present

## 2023-03-29 DIAGNOSIS — Z170 Estrogen receptor positive status [ER+]: Secondary | ICD-10-CM | POA: Diagnosis not present

## 2023-03-29 DIAGNOSIS — Z9221 Personal history of antineoplastic chemotherapy: Secondary | ICD-10-CM | POA: Diagnosis not present

## 2023-03-29 DIAGNOSIS — Z79899 Other long term (current) drug therapy: Secondary | ICD-10-CM | POA: Diagnosis not present

## 2023-03-29 DIAGNOSIS — Z803 Family history of malignant neoplasm of breast: Secondary | ICD-10-CM | POA: Diagnosis not present

## 2023-03-29 DIAGNOSIS — M545 Low back pain, unspecified: Secondary | ICD-10-CM | POA: Diagnosis not present

## 2023-03-29 DIAGNOSIS — C50911 Malignant neoplasm of unspecified site of right female breast: Secondary | ICD-10-CM | POA: Diagnosis not present

## 2023-03-29 DIAGNOSIS — Z79811 Long term (current) use of aromatase inhibitors: Secondary | ICD-10-CM | POA: Diagnosis not present

## 2023-03-29 DIAGNOSIS — R232 Flushing: Secondary | ICD-10-CM | POA: Diagnosis not present

## 2023-03-29 DIAGNOSIS — Z806 Family history of leukemia: Secondary | ICD-10-CM | POA: Diagnosis not present

## 2023-03-29 MED ORDER — VEOZAH 45 MG PO TABS: 45.0000 mg | ORAL_TABLET | Freq: Every day | ORAL | 3 refills | Status: DC

## 2023-03-29 NOTE — Patient Instructions (Addendum)
[facility] at [facility] Discharge Instructions   You were seen and examined today by Dr. Ellin Saba.  He reviewed the results of your lab work which are normal/stable.   Continue Lupron injections every 3 months. Next dose is due in July.   He reviewed the results of your mammogram which is normal.   Return as scheduled.    Thank you for choosing [facility] at [facility] to provide your oncology and hematology care.  To afford each patient quality time with our provider, please arrive at least 15 minutes before your scheduled appointment time.   If you have a lab appointment with the Cancer Center please come in thru the Main Entrance and check in at the main information desk.  You need to re-schedule your appointment should you arrive 10 or more minutes late.  We strive to give you quality time with our providers, and arriving late affects you and other patients whose appointments are after yours.  Also, if you no show three or more times for appointments you may be dismissed from the clinic at the providers discretion.     Again, thank you for choosing [facility].  Our hope is that these requests will decrease the amount of time that you wait before being seen by our physicians.       _____________________________________________________________  Should you have questions after your visit to [facility], please contact our office at [phone] and follow the prompts.  Our office hours are 8:00 a.m. and 4:30 p.m. Monday - Friday.  Please note that voicemails left after 4:00 p.m. may not be returned until the following business day.  We are closed weekends and major holidays.  You do have access to a nurse 24-7, just call the main number to the clinic [phone] and do not press any options, hold on the line and a nurse will answer the phone.    For prescription refill requests, have your pharmacy contact our  office and allow 72 hours.    Due to Covid, you will need to wear a mask upon entering the hospital. If you do not have a mask, a mask will be given to you at the Main Entrance upon arrival. For doctor visits, patients may have 1 support person age 25 or older with them. For treatment visits, patients can not have anyone with them due to social distancing guidelines and our immunocompromised population.

## 2023-04-11 ENCOUNTER — Ambulatory Visit
Admission: EM | Admit: 2023-04-11 | Discharge: 2023-04-11 | Disposition: A | Discharge status: Home / Self Care | Payer: Medicaid Other

## 2023-04-11 DIAGNOSIS — R197 Diarrhea, unspecified: Secondary | ICD-10-CM | POA: Diagnosis not present

## 2023-04-11 DIAGNOSIS — R112 Nausea with vomiting, unspecified: Secondary | ICD-10-CM

## 2023-04-11 NOTE — ED Triage Notes (Signed)
Pt c/o adb pain onset yesterday, pt states she does feel better than she did previously, but still has lingering nausea and diarrhea

## 2023-04-11 NOTE — ED Provider Notes (Signed)
[facility]    CSN: [id number] Arrival date & time: 04/11/23  1556      History   Chief Complaint No chief complaint on file.   HPI Doris Newman is a 52 y.o. female.   Patient presents today with 1 day history of abdominal cramping, nausea, vomiting, and diarrhea.  Reports she currently feels nauseous, however no vomiting today so far.  Reports around 8 episodes of vomiting yesterday, nonbloody in nature.  Has had a couple episodes of diarrhea today but it is improving.  No blood in the stool or pure watery stools.  Reports she has been able to keep fluids down today.  No current abdominal pain.  No fever, body aches or chills, or loss of taste/smell.  Reports she ate cookout prior to symptoms starting.  She is also around a coworker who was sick with similar symptoms.  No recent travel or recent suspicious drinking water intake.  No recent antibiotic use.    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  - Lobular neoplasia (atypical lobular hyperplasia).  -  Invasive carcinoma is broadly less than 0.1 cm to the medial margin of  specimen A.    OB History   No obstetric history on file.      Home Medications    Prior to Admission medications   Medication Sig Start Date End Date Taking? Authorizing Provider  ALPRAZolam Prudy Feeler) 1 MG tablet Take 1 mg by mouth 4 (four) times daily.    [provider]  anastrozole (ARIMIDEX) 1 MG tablet TAKE 1 TABLET BY MOUTH  DAILY 04/03/22   Doreatha Massed, MD  enalapril (VASOTEC) 20 MG tablet Take 1 tablet (20 mg total) by mouth daily. 01/05/23   [name], Elige Radon, MD  Fezolinetant (VEOZAH) 45 MG TABS Take 1 tablet (45 mg total) by mouth daily. 03/29/23   Doreatha Massed, MD  fluticasone (FLONASE) 50 MCG/ACT nasal spray Place 1 spray into both nostrils 2 (two) times daily as needed for allergies or rhinitis. 01/05/23   [name], Elige Radon, MD  furosemide (LASIX) 40 MG tablet Take 1 tablet (40 mg total) by mouth daily. For leg edema 03/20/23   Delynn Flavin M, DO  gabapentin (NEURONTIN) 300 MG capsule Take 2 capsules (600 mg total) by mouth at bedtime. 09/06/22   Doreatha Massed, MD  mirtazapine (REMERON) 15 MG tablet Take 15 mg by mouth at bedtime as needed (sleep).  10/29/19   [provider]  naproxen sodium (ALEVE) 220 MG tablet  Take 440 mg by mouth daily as needed (pain).     [provider]  omeprazole (PRILOSEC) 20 MG capsule Take 1 capsule (20 mg total) by mouth daily. 01/05/23   [name], Elige Radon, MD  propranolol (INDERAL) 20 MG tablet Take 20 mg by mouth daily.     [provider]  rizatriptan (MAXALT) 5 MG tablet Take 1 tablet (5 mg total) by mouth as needed for migraine. May repeat in 2 hours if needed 01/05/23   [name], Elige Radon, MD  rosuvastatin (CRESTOR) 5 MG tablet Take 1 tablet (5 mg total) by mouth daily. 01/15/23   [name], Elige Radon, MD  venlafaxine XR (EFFEXOR-XR) 37.5 MG 24 hr capsule Take 1 capsule (37.5 mg total) by mouth daily with  breakfast. Take 1 capsule for 1 week, then take 2 capsules daily. 08/24/20   Doreatha Massed, MD    Family History Family History  Problem Relation Age of Onset   Prostate cancer Father        dx. in his late 50s/early 35s   Breast cancer Paternal Grandmother        dx. in her 3s   Fibromyalgia Sister    Leukemia Paternal Grandfather        dx. in his 55s    Social History Social History   Tobacco Use   Smoking status: Some Days    Packs/day: 0.50    Years: 25.00    Additional pack years: 0.00    Total pack years: 12.50    Types: Cigarettes   Smokeless tobacco: Never  Vaping Use   Vaping Use: Never used  Substance Use Topics   Alcohol use: Yes    Comment: occasional   Drug use: No     Allergies   Patient has no known allergies.   Review of Systems Review of Systems Per HPI  Physical Exam Triage Vital Signs ED Triage Vitals  Enc Vitals Group     BP 04/11/23 1618 131/70     Pulse Rate 04/11/23 1618 76     Resp 04/11/23 1618 16     Temp 04/11/23 1618 98 F (36.7 C)     Temp Source 04/11/23 1618 Oral     SpO2 04/11/23 1618 98 %     Weight --      Height --      Head Circumference --      Peak Flow --      Pain Score 04/11/23 1625 0     Pain Loc --      Pain Edu? --      Excl. in GC? --    No data found.  Updated Vital Signs BP 131/70 (BP Location: Right Arm)   Pulse 76   Temp 98 F (36.7 C) (Oral)   Resp 16   LMP 02/29/2020   SpO2 98%   Visual Acuity Right Eye Distance:   Left Eye Distance:   Bilateral Distance:    Right Eye Near:   Left Eye Near:    Bilateral Near:     Physical Exam Vitals and nursing note reviewed.  Constitutional:      General: She is not in acute distress.    Appearance: Normal appearance. She is not toxic-appearing.  HENT:     Head: Normocephalic and atraumatic.     Mouth/Throat:     Mouth: Mucous membranes are moist.     Pharynx: Oropharynx is clear.  Eyes:     General: No scleral icterus.  Extraocular Movements: Extraocular movements intact.  Cardiovascular:     Rate and Rhythm: Normal rate and regular rhythm.  Pulmonary:     Effort: Pulmonary effort is normal. No respiratory distress.     Breath sounds: Normal breath sounds. No wheezing, rhonchi or rales.  Abdominal:     General: Abdomen is flat. Bowel sounds are increased. There is no distension.     Palpations: Abdomen is soft.     Tenderness: There is no abdominal tenderness. There is no guarding.  Skin:    General: Skin is warm and dry.     Capillary Refill: Capillary refill takes less than 2 seconds.     Coloration: Skin is not jaundiced or pale.     Findings: No erythema.  Neurological:     Mental Status: She is alert and oriented to person, place, and time.  Psychiatric:        Behavior: Behavior is cooperative.      UC Treatments / Results  Labs (all labs ordered are listed, but only abnormal results are displayed) Labs Reviewed - No data to display  EKG   Radiology No results found.  Procedures Procedures (including critical care time)  Medications Ordered in UC Medications - No data to display  Initial Impression / Assessment and Plan / UC Course  I have reviewed the triage vital signs and the nursing notes.  Pertinent labs & imaging results that were available during my care of the patient were reviewed by me and considered in my medical decision making (see chart for details).   Patient is well-appearing, normotensive, afebrile, not tachycardic, not tachypneic, oxygenating well on room air.    1. Nausea, vomiting, and diarrhea Suspect acute viral gastroenteritis Supportive care discussed with starting Zofran every 8 hours as needed for nausea/vomiting ER and return precautions discussed with patient Note given for work  The patient was given the opportunity to ask questions.  All questions answered to their satisfaction.  The patient is in agreement to this plan.    Final Clinical  Impressions(s) / UC Diagnoses   Final diagnoses:  Nausea, vomiting, and diarrhea     Discharge Instructions      Sounds like you may have a stomach bug.  He can take the Zofran every 8 hours as needed for nausea/vomiting.  Push hydration at home with water or sugar-free electrolytes like Pedialyte.  If you feel like eating, recommend soft, easy to digest foods for the next 48 hours.  Seek care if symptoms persist or worsen despite treatment.    ED Prescriptions   None    PDMP not reviewed this encounter.   Valentino Nose, NP 04/11/23 1640

## 2023-04-11 NOTE — Discharge Instructions (Signed)
Sounds like you may have a stomach bug.  He can take the Zofran every 8 hours as needed for nausea/vomiting.  Push hydration at home with water or sugar-free electrolytes like Pedialyte.  If you feel like eating, recommend soft, easy to digest foods for the next 48 hours.  Seek care if symptoms persist or worsen despite treatment.

## 2023-04-13 NOTE — Progress Notes (Signed)
Patient called for results of her DG Lumbar spine. C/O severe pain. Recommendations?   Message from Dr. Ellin Saba- Please call to let her know about arthritis changes in the back. Let her contact Dr. Louanne Skye who's arranging injection in the back.   Patient aware and is agreeable with plan.

## 2023-05-11 ENCOUNTER — Ambulatory Visit: Payer: Medicaid Other

## 2023-05-14 ENCOUNTER — Encounter (HOSPITAL_COMMUNITY): Payer: Self-pay | Admitting: Hematology

## 2023-05-15 ENCOUNTER — Inpatient Hospital Stay: Payer: BC Managed Care – PPO | Attending: Hematology

## 2023-05-15 ENCOUNTER — Ambulatory Visit
Admission: EM | Admit: 2023-05-15 | Discharge: 2023-05-15 | Disposition: A | Discharge status: Home / Self Care | Payer: Medicaid Other | Attending: Nurse Practitioner | Admitting: Nurse Practitioner

## 2023-05-15 VITALS — BP 130/86 | HR 64 | Temp 97.8°F | Resp 18 | Wt 180.0 lb

## 2023-05-15 DIAGNOSIS — M545 Low back pain, unspecified: Secondary | ICD-10-CM

## 2023-05-15 DIAGNOSIS — G8929 Other chronic pain: Secondary | ICD-10-CM | POA: Diagnosis not present

## 2023-05-15 DIAGNOSIS — C50912 Malignant neoplasm of unspecified site of left female breast: Secondary | ICD-10-CM | POA: Insufficient documentation

## 2023-05-15 DIAGNOSIS — Z170 Estrogen receptor positive status [ER+]: Secondary | ICD-10-CM | POA: Insufficient documentation

## 2023-05-15 MED ORDER — MELOXICAM 7.5 MG PO TABS: 7.5000 mg | ORAL_TABLET | Freq: Every day | ORAL | 0 refills | Status: DC

## 2023-05-15 MED ORDER — TIZANIDINE HCL 2 MG PO TABS: 2.0000 mg | ORAL_TABLET | Freq: Four times a day (QID) | ORAL | 0 refills | Status: AC | PRN

## 2023-05-15 MED ORDER — LEUPROLIDE ACETATE (3 MONTH) 11.25 MG IM KIT
11.2500 mg | PACK | Freq: Once | INTRAMUSCULAR | Status: AC
  Administered 2023-05-15: 11.25 mg via INTRAMUSCULAR
  Filled 2023-05-15: qty 11.25

## 2023-05-15 MED ORDER — KETOROLAC TROMETHAMINE 30 MG/ML IJ SOLN
30.0000 mg | Freq: Once | INTRAMUSCULAR | Status: AC
  Administered 2023-05-15: 30 mg via INTRAMUSCULAR

## 2023-05-15 MED ORDER — DEXAMETHASONE SODIUM PHOSPHATE 10 MG/ML IJ SOLN
10.0000 mg | INTRAMUSCULAR | Status: AC
  Administered 2023-05-15: 10 mg via INTRAMUSCULAR

## 2023-05-15 NOTE — Discharge Instructions (Addendum)
You were given an injection of Decadron 10 mg and Toradol 30 mg.  Do not start the prescription for meloxicam until 05/16/2023.  Do not take any additional ibuprofen, Motrin, Aleve, naproxen, or Naprosyn today.  May take over-the-counter Tylenol arthritis strength 650 mg tablets as needed for breakthrough pain. Take medication as prescribed. Try to remain as active as possible. Gentle range of motion and stretching exercises to help with back spasm and pain. May apply ice or heat as needed.  Ice is recommended for pain or swelling, heat for spasm or stiffness.  Apply for 20 minutes, remove for 1 hour, then repeat. Go to the emergency department immediately if you develop weakness in your legs or feet, inability to walk, loss of bowel or bladder function, difficulty urinating or passing a bowel movement, or other concerns. As discussed, if symptoms fail to improve, recommend following up with your primary care or with orthopedics for further evaluation.  I have given you information for Orth[facility] care [place] and for [facility]. Also continue to monitor your symptoms for worsening given your history of breast cancer. Follow-up as needed.

## 2023-05-15 NOTE — ED Provider Notes (Signed)
[facility]    CSN: [id number] Arrival date & time: 05/15/23  1223      History   Chief Complaint No chief complaint on file.   HPI Doris Newman is a 52 y.o. female.   The history is provided by the patient.   The patient presents for complaints of low back pain.  Patient reports pain has worsened over the past 2 to 3 days.  Patient reports that she does have a history of low back pain, and was previously diagnosed with arthritis.  Patient states that she was unable to get out of the bed 1 day ago due to the back pain.  Patient states the pain is located across her lower back.  She denies radiation of pain, numbness, tingling, lower extremity weakness, loss of bowel or bladder function, swelling, bruising, injury or trauma.  Patient states that her doctor did do x-rays previously, and she was told it was arthritis.  She states that he recommended an over-the-counter cream, but she states that did not help.  Patient does have a history of breast cancer.  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  - Lobular neoplasia (atypical lobular hyperplasia).  - Invasive carcinoma is broadly less than 0.1 cm to the medial margin of  specimen A.    OB History   No obstetric history on file.      Home Medications    Prior to Admission medications   Medication Sig Start Date End Date Taking? Authorizing Provider  meloxicam (MOBIC) 7.5 MG tablet Take 1 tablet (7.5 mg total) by mouth daily. 05/15/23  Yes Laureen Frederic-Warren, Sadie Haber, NP  tiZANidine (ZANAFLEX) 2 MG tablet Take 1 tablet (2 mg total) by mouth every 6 (six) hours as needed for up to 10 days for muscle spasms. 05/15/23 05/25/23 Yes Marlow Berenguer-Warren, Sadie Haber, NP  ALPRAZolam Prudy Feeler) 1 MG tablet Take 1 mg by mouth 4 (four) times daily.    [provider]  anastrozole (ARIMIDEX) 1 MG tablet TAKE 1 TABLET BY MOUTH  DAILY 04/03/22   Doreatha Massed, MD  enalapril (VASOTEC) 20 MG tablet Take 1 tablet (20 mg total) by mouth daily. 01/05/23   [name], Elige Radon, MD  Fezolinetant (VEOZAH) 45 MG TABS Take 1 tablet (45 mg total) by mouth daily. 03/29/23   Doreatha Massed, MD  fluticasone (FLONASE) 50 MCG/ACT nasal spray Place 1 spray into both nostrils 2 (two) times daily as needed for allergies or rhinitis. 01/05/23   [name], Elige Radon, MD  furosemide (LASIX) 40 MG tablet Take 1 tablet (40 mg total) by mouth daily. For leg edema 03/20/23   Delynn Flavin M, DO  gabapentin (NEURONTIN) 300 MG capsule Take 2 capsules (600 mg total) by mouth at bedtime. 09/06/22   Doreatha Massed, MD  mirtazapine (REMERON) 15 MG tablet Take 15 mg by mouth at bedtime as needed (sleep).  10/29/19   [provider]  naproxen sodium (ALEVE) 220 MG tablet Take 440 mg by mouth daily as needed (pain).     [provider]  omeprazole (PRILOSEC) 20 MG capsule Take 1 capsule (20 mg total) by mouth daily. 01/05/23   [name], Elige Radon, MD  propranolol (INDERAL) 20 MG tablet Take 20 mg by mouth daily.      [provider]  rizatriptan (MAXALT) 5 MG tablet Take 1 tablet (5 mg total) by mouth as needed for migraine. May repeat in 2 hours if needed 01/05/23   [name], Elige Radon, MD  rosuvastatin (CRESTOR) 5 MG tablet Take 1 tablet (5 mg total) by mouth daily. 01/15/23   [name], Elige Radon, MD  venlafaxine XR (EFFEXOR-XR) 37.5 MG 24 hr capsule Take 1 capsule (37.5 mg total) by mouth daily with breakfast. Take 1 capsule for 1 week, then take 2 capsules daily. 08/24/20   Doreatha Massed, MD    Family History Family History  Problem Relation Age of Onset   Prostate cancer Father        dx. in his late 50s/early 92s   Breast cancer Paternal Grandmother        dx. in her 74s   Fibromyalgia Sister    Leukemia Paternal Grandfather        dx. in his 53s    Social History Social History   Tobacco Use   Smoking status: Some Days    Current packs/day: 0.50    Average packs/day: 0.5 packs/day for 25.0 years (12.5 ttl pk-yrs)    Types: Cigarettes   Smokeless tobacco: Never  Vaping Use   Vaping status: Never Used  Substance Use Topics   Alcohol use: Yes    Comment: occasional   Drug use: No     Allergies   Patient has no known allergies.   Review of Systems Review of Systems Per HPI  Physical Exam Triage Vital Signs ED Triage Vitals  Encounter Vitals Group     BP 05/15/23 1236 113/77     Systolic BP Percentile --      Diastolic BP Percentile --      Pulse Rate 05/15/23 1236 81     Resp 05/15/23 1236 12     Temp 05/15/23 1236 98.3 F (36.8 C)     Temp Source 05/15/23 1236 Oral     SpO2 05/15/23 1236 97 %     Weight --      Height --      Head Circumference --      Peak Flow --      Pain Score 05/15/23 1244 7     Pain Loc --      Pain Education --      Exclude from Growth Chart --    No data found.  Updated Vital Signs BP 113/77 (BP Location: Right Arm)   Pulse 81   Temp 98.3 F (36.8 C) (Oral)   Resp 12   LMP 02/29/2020   SpO2 97%   Visual  Acuity Right Eye Distance:   Left Eye Distance:   Bilateral Distance:    Right  Eye Near:   Left Eye Near:    Bilateral Near:     Physical Exam Vitals and nursing note reviewed.  Constitutional:      General: She is not in acute distress.    Appearance: Normal appearance.  HENT:     Head: Normocephalic.  Eyes:     Extraocular Movements: Extraocular movements intact.     Pupils: Pupils are equal, round, and reactive to light.  Cardiovascular:     Rate and Rhythm: Normal rate and regular rhythm.     Pulses: Normal pulses.     Heart sounds: Normal heart sounds.  Pulmonary:     Effort: Pulmonary effort is normal. No respiratory distress.     Breath sounds: Normal breath sounds. No stridor. No wheezing, rhonchi or rales.  Abdominal:     General: Bowel sounds are normal.     Palpations: Abdomen is soft.     Tenderness: There is no abdominal tenderness.  Musculoskeletal:     Cervical back: Normal range of motion.     Lumbar back: Spasms present. No swelling, deformity, signs of trauma or tenderness. Decreased range of motion. Negative right straight leg raise test and negative left straight leg raise test.  Lymphadenopathy:     Cervical: No cervical adenopathy.  Skin:    General: Skin is warm and dry.  Neurological:     General: No focal deficit present.     Mental Status: She is alert and oriented to person, place, and time.  Psychiatric:        Mood and Affect: Mood normal.        Behavior: Behavior normal.      UC Treatments / Results  Labs (all labs ordered are listed, but only abnormal results are displayed) Labs Reviewed - No data to display  EKG   Radiology No results found.  Procedures Procedures (including critical care time)  Medications Ordered in UC Medications  ketorolac (TORADOL) 30 MG/ML injection 30 mg (30 mg Intramuscular Given 05/15/23 1319)  dexamethasone (DECADRON) injection 10 mg (10 mg Intramuscular Given 05/15/23 1316)    Initial  Impression / Assessment and Plan / UC Course  I have reviewed the triage vital signs and the nursing notes.  Pertinent labs & imaging results that were available during my care of the patient were reviewed by me and considered in my medical decision making (see chart for details).  The patient is well-appearing, she is in no acute distress, vital signs are stable.  Reviewed patient's imaging, and back pain is consistent with spondylosis indicated on her previous x-ray dated 03/29/2023.  Will treat patient for chronic low back pain.  Decadron 10 mg IM and Toradol 30 mg IM provided to the patient in the clinic (reviewed most recent lab work dated 03/20/2023, creatinine 0.89).  Will treat patient with tizanidine 2 mg tablets for back pain and spasm/stiffness, and meloxicam 7.5 mg daily until she can follow-up with her primary care physician.  Supportive care recommendations were provided and discussed with the patient to include the use of ice or heat, staying active, and gentle stretching and range of motion exercises.  Patient was given strict ER follow-up precautions.  Patient was also advised to continue to monitor worsening of her back pain, given her history of breast cancer.  She was also advised to follow-up with her primary care physician if symptoms fail to improve or with orthopedics.  Patient was given information for [facility] and for [facility] [place].  Patient is  in agreement with this plan of care and verbalizes understanding.  All questions were answered.  Patient stable for discharge.   Final Clinical Impressions(s) / UC Diagnoses   Final diagnoses:  Chronic bilateral low back pain without sciatica     Discharge Instructions      You were given an injection of Decadron 10 mg and Toradol 30 mg.  Do not start the prescription for meloxicam until 05/16/2023.  Do not take any additional ibuprofen, Motrin, Aleve, naproxen, or Naprosyn today.  May take over-the-counter Tylenol  arthritis strength 650 mg tablets as needed for breakthrough pain. Take medication as prescribed. Try to remain as active as possible. Gentle range of motion and stretching exercises to help with back spasm and pain. May apply ice or heat as needed.  Ice is recommended for pain or swelling, heat for spasm or stiffness.  Apply for 20 minutes, remove for 1 hour, then repeat. Go to the emergency department immediately if you develop weakness in your legs or feet, inability to walk, loss of bowel or bladder function, difficulty urinating or passing a bowel movement, or other concerns. As discussed, if symptoms fail to improve, recommend following up with your primary care or with orthopedics for further evaluation.  I have given you information for [facility] care [place] and for [facility]. Also continue to monitor your symptoms for worsening given your history of breast cancer. Follow-up as needed.     ED Prescriptions     Medication Sig Dispense Auth. Provider   tiZANidine (ZANAFLEX) 2 MG tablet Take 1 tablet (2 mg total) by mouth every 6 (six) hours as needed for up to 10 days for muscle spasms. 20 tablet Jennine Peddy-Warren, Sadie Haber, NP   meloxicam (MOBIC) 7.5 MG tablet Take 1 tablet (7.5 mg total) by mouth daily. 30 tablet Sintia Mckissic-Warren, Sadie Haber, NP      PDMP not reviewed this encounter.   Abran Cantor, NP 05/15/23 1323

## 2023-05-15 NOTE — Progress Notes (Signed)
Lupron injection given per orders. Patient tolerated it well without problems. Vitals stable and discharged home from clinic ambulatory. Follow up as scheduled.

## 2023-05-15 NOTE — ED Triage Notes (Addendum)
Pt c/o lower back pain x 3 days, started Sunday had pt unable to move for the last 2 days , because of pain.  Pt states she has seen her doctor previously for the pain back in the beginning of the year andwas told it was Arthritis but never did x-ray's. Was given a cream for it but it did not help with the pain.

## 2023-05-15 NOTE — Patient Instructions (Signed)
[facility]  Discharge Instructions: Thank you for choosing [facility] to provide your oncology and hematology care.  If you have a lab appointment with the Cancer Center - please note that after April 8th, 2024, all labs will be drawn in the cancer center.  You do not have to check in or register with the main entrance as you have in the past but will complete your check-in in the cancer center.  Wear comfortable clothing and clothing appropriate for easy access to any Portacath or PICC line.   We strive to give you quality time with your provider. You may need to reschedule your appointment if you arrive late (15 or more minutes).  Arriving late affects you and other patients whose appointments are after yours.  Also, if you miss three or more appointments without notifying the office, you may be dismissed from the clinic at the provider's discretion.      For prescription refill requests, have your pharmacy contact our office and allow 72 hours for refills to be completed.    Today you received the following injection-Lupron   To help prevent nausea and vomiting after your treatment, we encourage you to take your nausea medication as directed.  BELOW ARE SYMPTOMS THAT SHOULD BE REPORTED IMMEDIATELY: *FEVER GREATER THAN 100.4 F (38 C) OR HIGHER *CHILLS OR SWEATING *NAUSEA AND VOMITING THAT IS NOT CONTROLLED WITH YOUR NAUSEA MEDICATION *UNUSUAL SHORTNESS OF BREATH *UNUSUAL BRUISING OR BLEEDING *URINARY PROBLEMS (pain or burning when urinating, or frequent urination) *BOWEL PROBLEMS (unusual diarrhea, constipation, pain near the anus) TENDERNESS IN MOUTH AND THROAT WITH OR WITHOUT PRESENCE OF ULCERS (sore throat, sores in mouth, or a toothache) UNUSUAL RASH, SWELLING OR PAIN  UNUSUAL VAGINAL DISCHARGE OR ITCHING   Items with * indicate a potential emergency and should be followed up as soon as possible or go to the Emergency Department if any problems  should occur.  Please show the CHEMOTHERAPY ALERT CARD or IMMUNOTHERAPY ALERT CARD at check-in to the Emergency Department and triage nurse.  Should you have questions after your visit or need to cancel or reschedule your appointment, please contact [facility] AT [facility] [phone]  and follow the prompts.  Office hours are 8:00 a.m. to 4:30 p.m. Monday - Friday. Please note that voicemails left after 4:00 p.m. may not be returned until the following business day.  We are closed weekends and major holidays. You have access to a nurse at all times for urgent questions. Please call the main number to the clinic [phone] and follow the prompts.  For any non-urgent questions, you may also contact your provider using MyChart. We now offer e-Visits for anyone 54 and older to request care online for non-urgent symptoms. For details visit [email or web address].   Also download the MyChart app! Go to the app store, search "MyChart", open the app, select [place], and log in with your MyChart username and password.

## 2023-05-23 ENCOUNTER — Ambulatory Visit (HOSPITAL_BASED_OUTPATIENT_CLINIC_OR_DEPARTMENT_OTHER): Payer: BC Managed Care – PPO | Admitting: Student

## 2023-05-23 ENCOUNTER — Encounter (HOSPITAL_BASED_OUTPATIENT_CLINIC_OR_DEPARTMENT_OTHER): Payer: Self-pay | Admitting: Student

## 2023-05-23 DIAGNOSIS — M545 Low back pain, unspecified: Secondary | ICD-10-CM | POA: Diagnosis not present

## 2023-05-23 DIAGNOSIS — G8929 Other chronic pain: Secondary | ICD-10-CM | POA: Diagnosis not present

## 2023-05-24 NOTE — Progress Notes (Signed)
Chief Complaint: Low back pain     History of Present Illness:    Naavah Mesenbrink is a 52 y.o. female presenting today with 71-month history of low back pain.  Denies any injury or known cause.  Pain is located mostly in the low back with some radiation into the posterior left hip, but does not radiate down the legs.  Activities such as walking, standing for long periods of time, and laying down are more bothersome.  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  Was seen in the emergency department on 05/15/2023 and was given injections of Toradol 30 mg and Decadron 10 mg  which gave brief relief.  She has also been taking meloxicam 7.5 mg daily as well as tizanidine 2 mg at night.  Denies saddle anesthesia or bowel/bladder dysfunction.  Denies any other treatments at this time.  Patient does have history of breast cancer.   Surgical History:   None  PMH/PSH/Family History/Social History/Meds/Allergies:    Past Medical History:  Diagnosis Date   Anxiety    Family history of adverse reaction to anesthesia    PONV   Family history of breast cancer    Family history of leukemia    Family history of prostate cancer    GERD (gastroesophageal reflux disease)    Hypertension    Personal history of radiation therapy    Past Surgical History:  Procedure Laterality Date   BREAST LUMPECTOMY Left 03/08/2020   LEEP     PARTIAL MASTECTOMY WITH NEEDLE LOCALIZATION AND AXILLARY SENTINEL LYMPH NODE BX Left 03/08/2020   Ductal carcinoma in situ with calcifications, intermediate grade.  - Lobular neoplasia (atypical lobular hyperplasia).  - Invasive carcinoma is broadly less than 0.1 cm to the medial margin of  specimen A.   Social History   Socioeconomic History   Marital status: Widowed    Spouse name: Not on file   Number of children: 1   Years of education: Not on file   Highest  education level: GED or equivalent  Occupational History   Not on file  Tobacco Use   Smoking status: Some Days    Current packs/day: 0.50    Average packs/day: 0.5 packs/day for 25.0 years (12.5 ttl pk-yrs)    Types: Cigarettes   Smokeless tobacco: Never  Vaping Use   Vaping status: Never Used  Substance and Sexual Activity   Alcohol use: Yes    Comment: occasional   Drug use: No   Sexual activity: Not Currently  Other Topics Concern   Not on file  Social History Narrative   Not on file   Social Determinants of Health   Financial Resource Strain: Low Risk  (03/25/2020)   Overall Financial Resource Strain (CARDIA)    Difficulty of Paying Living Expenses: Not very hard  Food Insecurity: No Food Insecurity (03/25/2020)   Hunger Vital Sign    Worried About Running Out of Food in the Last Year: Never true    Ran Out of Food in the Last Year: Never true  Transportation Needs: No Transportation Needs (02/15/2021)   PRAPARE - Administrator, Civil Service (Medical): No    Lack of Transportation (Non-Medical): No  Physical Activity: Insufficiently Active (03/25/2020)   Exercise Vital Sign  Days of Exercise per Week: 2 days    Minutes of Exercise per Session: 20 min  Stress: Stress Concern Present (03/25/2020)   [employer] of Occupational Health - Occupational Stress Questionnaire    Feeling of Stress : To some extent  Social Connections: Moderately Integrated (03/25/2020)   Social Connection and Isolation Panel [NHANES]    Frequency of Communication with Friends and Family: More than three times a week    Frequency of Social Gatherings with Friends and Family: More than three times a week    Attends Religious Services: More than 4 times per year    Active Member of [employer] or Organizations: No    Attends Engineer, structural: More than 4 times per year    Marital Status: Widowed   Family History  Problem Relation Age of Onset   Prostate cancer Father         dx. in his late 50s/early 67s   Breast cancer Paternal Grandmother        dx. in her 19s   Fibromyalgia Sister    Leukemia Paternal Grandfather        dx. in his 30s   No Known Allergies Current Outpatient Medications  Medication Sig Dispense Refill   ALPRAZolam (XANAX) 1 MG tablet Take 1 mg by mouth 4 (four) times daily.     anastrozole (ARIMIDEX) 1 MG tablet TAKE 1 TABLET BY MOUTH  DAILY 90 tablet 3   enalapril (VASOTEC) 20 MG tablet Take 1 tablet (20 mg total) by mouth daily. 90 tablet 3   Fezolinetant (VEOZAH) 45 MG TABS Take 1 tablet (45 mg total) by mouth daily. 90 tablet 3   fluticasone (FLONASE) 50 MCG/ACT nasal spray Place 1 spray into both nostrils 2 (two) times daily as needed for allergies or rhinitis. 48 g 1   furosemide (LASIX) 40 MG tablet Take 1 tablet (40 mg total) by mouth daily. For leg edema 90 tablet 0   gabapentin (NEURONTIN) 300 MG capsule Take 2 capsules (600 mg total) by mouth at bedtime. 60 capsule 6   meloxicam (MOBIC) 7.5 MG tablet Take 1 tablet (7.5 mg total) by mouth daily. 30 tablet 0   mirtazapine (REMERON) 15 MG tablet Take 15 mg by mouth at bedtime as needed (sleep).      naproxen sodium (ALEVE) 220 MG tablet Take 440 mg by mouth daily as needed (pain).      omeprazole (PRILOSEC) 20 MG capsule Take 1 capsule (20 mg total) by mouth daily. 90 capsule 3   propranolol (INDERAL) 20 MG tablet Take 20 mg by mouth daily.      rizatriptan (MAXALT) 5 MG tablet Take 1 tablet (5 mg total) by mouth as needed for migraine. May repeat in 2 hours if needed 10 tablet 5   rosuvastatin (CRESTOR) 5 MG tablet Take 1 tablet (5 mg total) by mouth daily. 90 tablet 3   tiZANidine (ZANAFLEX) 2 MG tablet Take 1 tablet (2 mg total) by mouth every 6 (six) hours as needed for up to 10 days for muscle spasms. 20 tablet 0   venlafaxine XR (EFFEXOR-XR) 37.5 MG 24 hr capsule Take 1 capsule (37.5 mg total) by mouth daily with breakfast. Take 1 capsule for 1 week, then take 2 capsules  daily. 60 capsule 3   No current facility-administered medications for this visit.   Facility-Administered Medications Ordered in Other Visits  Medication Dose Route Frequency Provider Last Rate Last Admin   leuprolide (LUPRON) injection 11.25 mg  11.25 mg Intramuscular Once Doreatha Massed, MD       No results found.  Review of Systems:   A ROS was performed including pertinent positives and negatives as documented in the HPI.  Physical Exam :   Constitutional: NAD and appears stated age Neurological: Alert and oriented Psych: Appropriate affect and cooperative Last menstrual period 02/29/2020.   Comprehensive Musculoskeletal Exam:    Tenderness to palpation over the lumbar spinous processes.  No SI or paraspinal muscle tenderness.  Passive range of motion of bilateral hips to 120 degrees flexion, 30 degrees external rotation, and 20 degrees internal rotation.  Negative FABER and straight leg raise bilaterally.  5/5 strength of knee flexion/extension and ankle plantarflexion/dorsiflexion.  Neurosensory exam intact.  Imaging:   Xray review from 03/29/23 (lumbar spine 4 views): Negative for fracture. Mild degenerative changes with well preserved disc spaces.    I personally reviewed and interpreted the radiographs.   Assessment:   52 y.o. female with 5 month history of low back pain.  This radiates some into the left hip but no sciatic symptoms present today.  At this point I do believe some sessions of physical therapy would be very beneficial.  Will plan to refer to PT in [place] which is closer to where she lives.  Recommend continuing current course of meloxicam as well as tizanidine as needed at night.  Will provide a work note as she was out yesterday.  Plan to see her back in 5 weeks for reevaluation and assess relief with therapy.  Plan :    - Referral to physical therapy for lumbar program - Return to clinic in 5 weeks     I personally saw and evaluated the  patient, and participated in the management and treatment plan.  Hazle Nordmann, PA-C Orthopedics  This document was dictated using Conservation officer, historic buildings. A reasonable attempt at proof reading has been made to minimize errors.

## 2023-05-28 ENCOUNTER — Encounter (HOSPITAL_COMMUNITY): Payer: Self-pay | Admitting: Hematology

## 2023-07-05 DIAGNOSIS — F3342 Major depressive disorder, recurrent, in full remission: Secondary | ICD-10-CM | POA: Diagnosis not present

## 2023-07-12 ENCOUNTER — Ambulatory Visit: Payer: Medicaid Other

## 2023-07-19 ENCOUNTER — Encounter: Payer: Self-pay | Admitting: Family Medicine

## 2023-07-19 ENCOUNTER — Ambulatory Visit: Payer: BC Managed Care – PPO | Admitting: Family Medicine

## 2023-07-19 VITALS — BP 109/76 | HR 76 | Ht 66.0 in | Wt 180.0 lb

## 2023-07-19 DIAGNOSIS — M533 Sacrococcygeal disorders, not elsewhere classified: Secondary | ICD-10-CM

## 2023-07-19 DIAGNOSIS — E7800 Pure hypercholesterolemia, unspecified: Secondary | ICD-10-CM

## 2023-07-19 DIAGNOSIS — K219 Gastro-esophageal reflux disease without esophagitis: Secondary | ICD-10-CM

## 2023-07-19 DIAGNOSIS — I10 Essential (primary) hypertension: Secondary | ICD-10-CM | POA: Diagnosis not present

## 2023-07-19 DIAGNOSIS — Z23 Encounter for immunization: Secondary | ICD-10-CM | POA: Diagnosis not present

## 2023-07-19 MED ORDER — MELOXICAM 7.5 MG PO TABS: 7.5000 mg | ORAL_TABLET | Freq: Every day | ORAL | 1 refills | Status: DC

## 2023-07-19 MED ORDER — TIZANIDINE HCL 2 MG PO CAPS: 2.0000 mg | ORAL_CAPSULE | Freq: Every evening | ORAL | 2 refills | Status: DC | PRN

## 2023-07-19 NOTE — Addendum Note (Signed)
Addended by: Dorene Sorrow on: 07/19/2023 01:48 PM   Modules accepted: Orders

## 2023-07-19 NOTE — Progress Notes (Signed)
BP 109/76   Pulse 76   Ht 5\' 6"  (1.676 m)   Wt 180 lb (81.6 kg)   LMP 02/29/2020   SpO2 99%   BMI 29.05 kg/m    Subjective:   Patient ID: Doris Newman, female    DOB: Apr 22, 1971, 52 y.o.   MRN: [id number]  HPI: Doris Newman is a 52 y.o. female presenting on 07/19/2023 for Medical Management of Chronic Issues and Hypertension   HPI Hypertension Patient is currently on furosemide and propranolol, and their blood pressure today is 109/76. Patient denies any lightheadedness or dizziness. Patient denies headaches, blurred vision, chest pains, shortness of breath, or weakness. Denies any side effects from medication and is content with current medication.   Hyperlipidemia Patient is coming in for recheck of his hyperlipidemia. The patient is currently taking Crestor. They deny any issues with myalgias or history of liver damage from it. They deny any focal numbness or weakness or chest pain.   GERD Patient is currently on omeprazole.  She denies any major symptoms or abdominal pain or belching or burping. She denies any blood in her stool or lightheadedness or dizziness.   Patient has been having some lower back issues and SI joint dysfunction.  She did see an orthopedic for this and gave him meloxicam and recommended physical therapy which she has not done at this point.  Relevant past medical, surgical, family and social history reviewed and updated as indicated. Interim medical history since our last visit reviewed. Allergies and medications reviewed and updated.  Review of Systems  Constitutional:  Negative for chills and fever.  Eyes:  Negative for visual disturbance.  Respiratory:  Negative for chest tightness and shortness of breath.   Cardiovascular:  Negative for chest pain and leg swelling.  Genitourinary:  Negative for difficulty urinating and dysuria.  Musculoskeletal:  Negative for back pain and gait problem.  Skin:  Negative for rash.  Neurological:   Negative for dizziness, light-headedness and headaches.  Psychiatric/Behavioral:  Negative for agitation and behavioral problems.   All other systems reviewed and are negative.   Per HPI unless specifically indicated above   Allergies as of 07/19/2023   No Known Allergies      Medication List        Accurate as of July 19, 2023  1:38 PM. If you have any questions, ask your nurse or doctor.          STOP taking these medications    naproxen sodium 220 MG tablet Commonly known as: ALEVE Stopped by: Elige Radon Mario Coronado       TAKE these medications    ALPRAZolam 1 MG tablet Commonly known as: XANAX Take 1 mg by mouth 4 (four) times daily.   anastrozole 1 MG tablet Commonly known as: ARIMIDEX TAKE 1 TABLET BY MOUTH  DAILY   enalapril 20 MG tablet Commonly known as: VASOTEC Take 1 tablet (20 mg total) by mouth daily.   fluticasone 50 MCG/ACT nasal spray Commonly known as: FLONASE Place 1 spray into both nostrils 2 (two) times daily as needed for allergies or rhinitis.   furosemide 40 MG tablet Commonly known as: LASIX Take 1 tablet (40 mg total) by mouth daily. For leg edema   gabapentin 300 MG capsule Commonly known as: NEURONTIN Take 2 capsules (600 mg total) by mouth at bedtime.   meloxicam 7.5 MG tablet Commonly known as: Mobic Take 1 tablet (7.5 mg total) by mouth daily.   mirtazapine 15 MG tablet Commonly known  as: REMERON Take 15 mg by mouth at bedtime as needed (sleep).   omeprazole 20 MG capsule Commonly known as: PRILOSEC Take 1 capsule (20 mg total) by mouth daily.   propranolol 20 MG tablet Commonly known as: INDERAL Take 20 mg by mouth daily.   rizatriptan 5 MG tablet Commonly known as: MAXALT Take 1 tablet (5 mg total) by mouth as needed for migraine. May repeat in 2 hours if needed   rosuvastatin 5 MG tablet Commonly known as: Crestor Take 1 tablet (5 mg total) by mouth daily.   tizanidine 2 MG capsule Commonly known as:  ZANAFLEX Take 1 capsule (2 mg total) by mouth at bedtime as needed for muscle spasms. Started by: Elige Radon Genavive Kubicki   venlafaxine XR 37.5 MG 24 hr capsule Commonly known as: EFFEXOR-XR Take 1 capsule (37.5 mg total) by mouth daily with breakfast. Take 1 capsule for 1 week, then take 2 capsules daily.   Veozah 45 MG Tabs Generic drug: Fezolinetant Take 1 tablet (45 mg total) by mouth daily.         Objective:   BP 109/76   Pulse 76   Ht 5\' 6"  (1.676 m)   Wt 180 lb (81.6 kg)   LMP 02/29/2020   SpO2 99%   BMI 29.05 kg/m   Wt Readings from Last 3 Encounters:  07/19/23 180 lb (81.6 kg)  05/15/23 180 lb (81.6 kg)  03/29/23 185 lb 3.2 oz (84 kg)    Physical Exam Vitals and nursing note reviewed.  Constitutional:      General: She is not in acute distress.    Appearance: She is well-developed. She is not diaphoretic.  Eyes:     Conjunctiva/sclera: Conjunctivae normal.  Cardiovascular:     Rate and Rhythm: Normal rate and regular rhythm.     Heart sounds: Normal heart sounds. No murmur heard. Pulmonary:     Effort: Pulmonary effort is normal. No respiratory distress.     Breath sounds: Normal breath sounds. No wheezing.  Musculoskeletal:        General: No swelling or tenderness. Normal range of motion.  Skin:    General: Skin is warm and dry.     Findings: No rash.  Neurological:     Mental Status: She is alert and oriented to person, place, and time.     Coordination: Coordination normal.  Psychiatric:        Behavior: Behavior normal.       Assessment & Plan:   Problem List Items Addressed This Visit       Cardiovascular and Mediastinum   Essential hypertension - Primary   Relevant Orders   CBC with Differential/Platelet   CMP14+EGFR   Lipid panel     Digestive   Gastroesophageal reflux disease without esophagitis   Relevant Orders   CBC with Differential/Platelet   CMP14+EGFR   Lipid panel     Other   Hyperlipidemia   Relevant Orders   CBC  with Differential/Platelet   CMP14+EGFR   Lipid panel   Other Visit Diagnoses     SI (sacroiliac) joint dysfunction           Gave some muscle relaxer and some anti-inflammatories that she can use for back.  Encouraged physical therapy but she is going to hold off on it for now. Follow up plan: Return in about 6 months (around 01/17/2024), or if symptoms worsen or fail to improve, for Hypertension and hyperlipidemia..  Counseling provided for all of the vaccine  components Orders Placed This Encounter  Procedures   CBC with Differential/Platelet   CMP14+EGFR   Lipid panel    Arville Care, MD [facility] 07/19/2023, 1:38 PM

## 2023-07-20 LAB — CBC WITH DIFFERENTIAL/PLATELET
Basophils Absolute: 0 10*3/uL (ref 0.0–0.2)
Basos: 1 %
EOS (ABSOLUTE): 0.2 10*3/uL (ref 0.0–0.4)
Eos: 4 %
Hematocrit: 39.6 % (ref 34.0–46.6)
Hemoglobin: 12.5 g/dL (ref 11.1–15.9)
Immature Grans (Abs): 0 10*3/uL (ref 0.0–0.1)
Immature Granulocytes: 0 %
Lymphocytes Absolute: 1.7 10*3/uL (ref 0.7–3.1)
Lymphs: 39 %
MCH: 29 pg (ref 26.6–33.0)
MCHC: 31.6 g/dL (ref 31.5–35.7)
MCV: 92 fL (ref 79–97)
Monocytes Absolute: 0.3 10*3/uL (ref 0.1–0.9)
Monocytes: 7 %
Neutrophils Absolute: 2.2 10*3/uL (ref 1.4–7.0)
Neutrophils: 49 %
Platelets: 288 10*3/uL (ref 150–450)
RBC: 4.31 x10E6/uL (ref 3.77–5.28)
RDW: 13.1 % (ref 11.7–15.4)
WBC: 4.3 10*3/uL (ref 3.4–10.8)

## 2023-07-20 LAB — CMP14+EGFR
ALT: 21 [IU]/L (ref 0–32)
AST: 20 [IU]/L (ref 0–40)
Albumin: 4.6 g/dL (ref 3.8–4.9)
Alkaline Phosphatase: 107 [IU]/L (ref 44–121)
BUN/Creatinine Ratio: 9 (ref 9–23)
BUN: 9 mg/dL (ref 6–24)
Bilirubin Total: 0.4 mg/dL (ref 0.0–1.2)
CO2: 25 mmol/L (ref 20–29)
Calcium: 10.1 mg/dL (ref 8.7–10.2)
Chloride: 101 mmol/L (ref 96–106)
Creatinine, Ser: 1.03 mg/dL — ABNORMAL HIGH (ref 0.57–1.00)
Globulin, Total: 2.3 g/dL (ref 1.5–4.5)
Glucose: 97 mg/dL (ref 70–99)
Potassium: 3.8 mmol/L (ref 3.5–5.2)
Sodium: 142 mmol/L (ref 134–144)
Total Protein: 6.9 g/dL (ref 6.0–8.5)
eGFR: 65 mL/min/{1.73_m2} (ref >59)

## 2023-07-20 LAB — LIPID PANEL
Chol/HDL Ratio: 3.5 {ratio} (ref 0.0–4.4)
Cholesterol, Total: 214 mg/dL — ABNORMAL HIGH (ref 100–199)
HDL: 61 mg/dL (ref >39)
LDL Chol Calc (NIH): 125 mg/dL — ABNORMAL HIGH (ref 0–99)
Triglycerides: 161 mg/dL — ABNORMAL HIGH (ref 0–149)
VLDL Cholesterol Cal: 28 mg/dL (ref 5–40)

## 2023-07-23 DIAGNOSIS — K219 Gastro-esophageal reflux disease without esophagitis: Secondary | ICD-10-CM | POA: Diagnosis not present

## 2023-07-23 DIAGNOSIS — I10 Essential (primary) hypertension: Secondary | ICD-10-CM | POA: Diagnosis not present

## 2023-07-23 DIAGNOSIS — Z23 Encounter for immunization: Secondary | ICD-10-CM | POA: Diagnosis not present

## 2023-07-23 DIAGNOSIS — E7800 Pure hypercholesterolemia, unspecified: Secondary | ICD-10-CM | POA: Diagnosis not present

## 2023-07-23 DIAGNOSIS — M533 Sacrococcygeal disorders, not elsewhere classified: Secondary | ICD-10-CM | POA: Diagnosis not present

## 2023-07-25 ENCOUNTER — Telehealth: Payer: Self-pay | Admitting: Family Medicine

## 2023-07-25 DIAGNOSIS — R600 Localized edema: Secondary | ICD-10-CM

## 2023-07-25 MED ORDER — FUROSEMIDE 40 MG PO TABS: 40.0000 mg | ORAL_TABLET | Freq: Every day | ORAL | 1 refills | Status: DC

## 2023-07-25 NOTE — Telephone Encounter (Signed)
Patient notified and verbalized understanding. 

## 2023-07-25 NOTE — Telephone Encounter (Signed)
Kidney numbers look decent, I sent a refill for the furosemide, just let her know if she does not have to use it every day then do not use it every day but she can if needed

## 2023-07-26 ENCOUNTER — Ambulatory Visit: Payer: Medicaid Other | Admitting: Family Medicine

## 2023-08-13 ENCOUNTER — Other Ambulatory Visit: Payer: Self-pay | Admitting: Family Medicine

## 2023-08-13 ENCOUNTER — Other Ambulatory Visit: Payer: Self-pay | Admitting: Hematology

## 2023-08-13 ENCOUNTER — Telehealth: Payer: Self-pay

## 2023-08-13 ENCOUNTER — Other Ambulatory Visit: Payer: Self-pay | Admitting: *Deleted

## 2023-08-13 DIAGNOSIS — C50912 Malignant neoplasm of unspecified site of left female breast: Secondary | ICD-10-CM

## 2023-08-13 NOTE — Telephone Encounter (Signed)
Called [employer] to request prior authorization for Anastrozole 1 mg Tablets. [facility] will fax a clinical review form to be completed by Provider.

## 2023-08-13 NOTE — Telephone Encounter (Signed)
Anastrozole refill approved.  Patient is tolerating and is to continue therapy.

## 2023-08-15 ENCOUNTER — Inpatient Hospital Stay: Payer: BC Managed Care – PPO | Attending: Hematology

## 2023-08-15 VITALS — BP 131/93 | HR 91 | Temp 97.8°F | Resp 18

## 2023-08-15 DIAGNOSIS — Z79818 Long term (current) use of other agents affecting estrogen receptors and estrogen levels: Secondary | ICD-10-CM | POA: Diagnosis not present

## 2023-08-15 DIAGNOSIS — Z170 Estrogen receptor positive status [ER+]: Secondary | ICD-10-CM | POA: Insufficient documentation

## 2023-08-15 DIAGNOSIS — C50912 Malignant neoplasm of unspecified site of left female breast: Secondary | ICD-10-CM | POA: Diagnosis not present

## 2023-08-15 MED ORDER — LEUPROLIDE ACETATE (3 MONTH) 11.25 MG IM KIT
11.2500 mg | PACK | Freq: Once | INTRAMUSCULAR | Status: AC
  Administered 2023-08-15: 11.25 mg via INTRAMUSCULAR
  Filled 2023-08-15: qty 11.25

## 2023-08-15 NOTE — Progress Notes (Signed)
Patient presents today for Lupron injection per providers order.  Vital signs WNL.  Patient has no new complaints at this time.  Sable during administration without incident; injection site WNL; see MAR for injection details.  Patient tolerated procedure well and without incident.  No questions or complaints noted at this time.

## 2023-08-15 NOTE — Patient Instructions (Signed)
[facility]  Discharge Instructions: Thank you for choosing [facility] to provide your oncology and hematology care.  If you have a lab appointment with the [facility] - please note that after April 8th, 2024, all labs will be drawn in the cancer center.  You do not have to check in or register with the main entrance as you have in the past but will complete your check-in in the cancer center.  Wear comfortable clothing and clothing appropriate for easy access to any Portacath or PICC line.   We strive to give you quality time with your provider. You may need to reschedule your appointment if you arrive late (15 or more minutes).  Arriving late affects you and other patients whose appointments are after yours.  Also, if you miss three or more appointments without notifying the office, you may be dismissed from the clinic at the provider's discretion.      For prescription refill requests, have your pharmacy contact our office and allow 72 hours for refills to be completed.    Today you received the following chemotherapy and/or immunotherapy agents Lupron      To help prevent nausea and vomiting after your treatment, we encourage you to take your nausea medication as directed.  BELOW ARE SYMPTOMS THAT SHOULD BE REPORTED IMMEDIATELY: *FEVER GREATER THAN 100.4 F (38 C) OR HIGHER *CHILLS OR SWEATING *NAUSEA AND VOMITING THAT IS NOT CONTROLLED WITH YOUR NAUSEA MEDICATION *UNUSUAL SHORTNESS OF BREATH *UNUSUAL BRUISING OR BLEEDING *URINARY PROBLEMS (pain or burning when urinating, or frequent urination) *BOWEL PROBLEMS (unusual diarrhea, constipation, pain near the anus) TENDERNESS IN MOUTH AND THROAT WITH OR WITHOUT PRESENCE OF ULCERS (sore throat, sores in mouth, or a toothache) UNUSUAL RASH, SWELLING OR PAIN  UNUSUAL VAGINAL DISCHARGE OR ITCHING   Items with * indicate a potential emergency and should be followed up as soon as possible or go to the  Emergency Department if any problems should occur.  Please show the CHEMOTHERAPY ALERT CARD or IMMUNOTHERAPY ALERT CARD at check-in to the Emergency Department and triage nurse.  Should you have questions after your visit or need to cancel or reschedule your appointment, please contact [facility] [phone]  and follow the prompts.  Office hours are 8:00 a.m. to 4:30 p.m. Monday - Friday. Please note that voicemails left after 4:00 p.m. may not be returned until the following business day.  We are closed weekends and major holidays. You have access to a nurse at all times for urgent questions. Please call the main number to the clinic [phone] and follow the prompts.  For any non-urgent questions, you may also contact your provider using MyChart. We now offer e-Visits for anyone 74 and older to request care online for non-urgent symptoms. For details visit [email or web address].   Also download the MyChart app! Go to the app store, search "MyChart", open the app, select [place], and log in with your MyChart username and password.

## 2023-08-16 ENCOUNTER — Inpatient Hospital Stay: Payer: BC Managed Care – PPO

## 2023-08-21 NOTE — Progress Notes (Signed)
Clinical Review form for prior authorization request for Anastrozole completed and faxed back to [facility] as requested.

## 2023-09-20 ENCOUNTER — Encounter (HOSPITAL_COMMUNITY): Payer: Self-pay | Admitting: Hematology

## 2023-09-24 ENCOUNTER — Other Ambulatory Visit: Payer: Medicaid Other

## 2023-09-26 ENCOUNTER — Other Ambulatory Visit: Payer: Self-pay

## 2023-09-26 DIAGNOSIS — C50912 Malignant neoplasm of unspecified site of left female breast: Secondary | ICD-10-CM

## 2023-09-27 ENCOUNTER — Inpatient Hospital Stay: Payer: Medicaid Other | Attending: Hematology

## 2023-09-27 DIAGNOSIS — Z170 Estrogen receptor positive status [ER+]: Secondary | ICD-10-CM | POA: Insufficient documentation

## 2023-09-27 DIAGNOSIS — Z9012 Acquired absence of left breast and nipple: Secondary | ICD-10-CM | POA: Insufficient documentation

## 2023-09-27 DIAGNOSIS — R232 Flushing: Secondary | ICD-10-CM | POA: Diagnosis not present

## 2023-09-27 DIAGNOSIS — F17210 Nicotine dependence, cigarettes, uncomplicated: Secondary | ICD-10-CM | POA: Diagnosis not present

## 2023-09-27 DIAGNOSIS — Z8042 Family history of malignant neoplasm of prostate: Secondary | ICD-10-CM | POA: Diagnosis not present

## 2023-09-27 DIAGNOSIS — Z803 Family history of malignant neoplasm of breast: Secondary | ICD-10-CM | POA: Insufficient documentation

## 2023-09-27 DIAGNOSIS — Z79811 Long term (current) use of aromatase inhibitors: Secondary | ICD-10-CM | POA: Insufficient documentation

## 2023-09-27 DIAGNOSIS — C50912 Malignant neoplasm of unspecified site of left female breast: Secondary | ICD-10-CM | POA: Insufficient documentation

## 2023-09-27 DIAGNOSIS — Z1721 Progesterone receptor positive status: Secondary | ICD-10-CM | POA: Insufficient documentation

## 2023-09-27 DIAGNOSIS — Z806 Family history of leukemia: Secondary | ICD-10-CM | POA: Insufficient documentation

## 2023-09-27 LAB — CBC WITH DIFFERENTIAL/PLATELET
Abs Immature Granulocytes: 0.02 10*3/uL (ref 0.00–0.07)
Basophils Absolute: 0 10*3/uL (ref 0.0–0.1)
Basophils Relative: 1 %
Eosinophils Absolute: 0.1 10*3/uL (ref 0.0–0.5)
Eosinophils Relative: 1 %
HCT: 39.4 % (ref 36.0–46.0)
Hemoglobin: 13.2 g/dL (ref 12.0–15.0)
Immature Granulocytes: 0 %
Lymphocytes Relative: 38 %
Lymphs Abs: 1.7 10*3/uL (ref 0.7–4.0)
MCH: 30.6 pg (ref 26.0–34.0)
MCHC: 33.5 g/dL (ref 30.0–36.0)
MCV: 91.2 fL (ref 80.0–100.0)
Monocytes Absolute: 0.4 10*3/uL (ref 0.1–1.0)
Monocytes Relative: 9 %
Neutro Abs: 2.3 10*3/uL (ref 1.7–7.7)
Neutrophils Relative %: 51 %
Platelets: 291 10*3/uL (ref 150–400)
RBC: 4.32 MIL/uL (ref 3.87–5.11)
RDW: 13.4 % (ref 11.5–15.5)
WBC: 4.5 10*3/uL (ref 4.0–10.5)
nRBC: 0 % (ref 0.0–0.2)

## 2023-09-27 LAB — COMPREHENSIVE METABOLIC PANEL
ALT: 27 U/L (ref 0–44)
AST: 26 U/L (ref 15–41)
Albumin: 4.2 g/dL (ref 3.5–5.0)
Alkaline Phosphatase: 95 U/L (ref 38–126)
Anion gap: 11 (ref 5–15)
BUN: 13 mg/dL (ref 6–20)
CO2: 27 mmol/L (ref 22–32)
Calcium: 10 mg/dL (ref 8.9–10.3)
Chloride: 100 mmol/L (ref 98–111)
Creatinine, Ser: 0.94 mg/dL (ref 0.44–1.00)
GFR, Estimated: >60 mL/min (ref >60)
Glucose, Bld: 94 mg/dL (ref 70–99)
Potassium: 3.7 mmol/L (ref 3.5–5.1)
Sodium: 138 mmol/L (ref 135–145)
Total Bilirubin: 0.5 mg/dL (ref <1.2)
Total Protein: 7.7 g/dL (ref 6.5–8.1)

## 2023-10-01 ENCOUNTER — Ambulatory Visit: Payer: Medicaid Other | Admitting: Hematology

## 2023-10-10 NOTE — Progress Notes (Signed)
[facility] [address] [place] [address]    Clinic Day:  10/11/2023  Referring physician: [name], Elige Radon, MD  Patient Care Team: [name], Elige Radon, MD as PCP - General (Family Medicine) Mickie Bail, RN as Oncology Nurse Navigator (Oncology) Doreatha Massed, MD as Medical Oncologist (Oncology)   ASSESSMENT & PLAN:   Assessment: 1.  Stage Ib (T2N0) left breast IDC: -Right breast lump was palp[facility] at [facility].  Bilateral mammogram and ultrasound on 02/04/2020 at [facility] showed 1.8 x 1.3 x 2.1 cm irregular hypoechoic mass at the 2 o'clock position 3 to 4 cm from nipple. -Ultrasound-guided biopsy on 02/04/2020 showed invasive ductal carcinoma, grade 2. -Left lumpectomy after needle localization and SLNB by Dr. Henreitta Leber on 03/08/2020. -Pathology-2.1 cm IDC, grade 1, 0/3 lymph nodes positive, PT 2 PN 0, ER 95%, PR to 70%, HER-2 negative, Ki-67 10%.  Invasive carcinoma is broadly less than 0.1 cm to the medial margin.  Reexcision margins were negative. -Oncotype DX recurrence score 16, distant recurrence risk at 9 years with tamoxifen/AI of 4%, absolute chemotherapy benefit less than 1%. -XRT to the left breast started on 05/04/2020 and finished in August 2021. -Lupron started on 05/31/2020 along with anastrozole.   2.  Family history: -Paternal grandmother had breast cancer. -Father had prostate cancer.    Plan: 1.  Stage I left breast IDC: - She is tolerating Lupron and anastrozole very well. - Mammogram on 03/20/2023: BI-RADS Category 2. - Labs from 09/27/2023: Normal LFTs and CBC. - Physical exam: Left breast upper outer quadrant periareolar lumpectomy site is within normal limits.  No palpable masses or adenopathy. - Recommend Lupron every 3 months and anastrozole daily.  RTC in June 2025 after mammogram.   2.  Hot flashes: - Continue Effexor 75 mg daily. - Continue Veozah 45 mg daily which is helping.  She is also taking gabapentin at  bedtime which have told her to stop.   3.  Bone health (DEXA 02/28/2022 T-score -0.7): - Last vitamin D was normal at 38.  Continue calcium and vitamin D supplements.    Orders Placed This Encounter  Procedures   MM 3D SCREENING MAMMOGRAM BILATERAL BREAST    Standing Status:   Future    Expected Date:   03/25/2024    Expiration Date:   10/10/2024    Reason for Exam (SYMPTOM  OR DIAGNOSIS REQUIRED):   breast cancer screening    Is the patient pregnant?:   No    Preferred imaging location?:   [facility] R Teague,acting as a scribe for Doreatha Massed, MD.,have documented all relevant documentation on the behalf of Doreatha Massed, MD,as directed by  Doreatha Massed, MD while in the presence of Doreatha Massed, MD.  I, Doreatha Massed MD, have reviewed the above documentation for accuracy and completeness, and I agree with the above.    Doreatha Massed, MD   12/26/20242:15 PM  CHIEF COMPLAINT:   Diagnosis: left breast invasive ductal carcinoma    Cancer Staging  Invasive ductal carcinoma of left breast [facility]) Staging form: Breast, AJCC 8th Edition - Clinical stage from 03/25/2020: Stage IB (cT2, cN0(sn), cM0, G1, ER+, PR+, HER2-) - Unsigned    Prior Therapy: 1. Left lumpectomy and SLNB, 03/08/20 2. XRT to left breast 05/04/20 - 05/2020  Current Therapy:  Lupron + anastrozole   HISTORY OF PRESENT ILLNESS:   Oncology History  Invasive ductal carcinoma of left breast (HCC)  02/19/2020 Initial Diagnosis   Invasive ductal carcinoma of left breast (HCC)   04/06/2020 Genetic Testing   Oncotype DX Breast Recurrence Score Report         INTERVAL HISTORY:   Denyse is a 52 y.o. female presenting to clinic today for follow up of left breast invasive ductal carcinoma. She was last seen by me on 03/29/23.  Since her last visit, she was seen in urgent care on 05/15/23 for low back pain without sciatica. She was seen by orthopedics on  05/23/23 and was referred to PT. She had a DG of the lumbar spine on 03/29/23 that found: mild multilevel spondylosis and facet hypertrophy, greatest at the lumbosacral junction and no acute bony abnormality.  Today, she states that she is doing well overall. Her appetite level is at 75%. Her energy level is at 75%.  PAST MEDICAL HISTORY:   Past Medical History: Past Medical History:  Diagnosis Date   Anxiety    Family history of adverse reaction to anesthesia    PONV   Family history of breast cancer    Family history of leukemia    Family history of prostate cancer    GERD (gastroesophageal reflux disease)    Hypertension    Personal history of radiation therapy     Surgical History: Past Surgical History:  Procedure Laterality Date   BREAST LUMPECTOMY Left 03/08/2020   LEEP     PARTIAL MASTECTOMY WITH NEEDLE LOCALIZATION AND AXILLARY SENTINEL LYMPH NODE BX Left 03/08/2020   Ductal carcinoma in situ with calcifications, intermediate grade.  - Lobular neoplasia (atypical lobular hyperplasia).  - Invasive carcinoma is broadly less than 0.1 cm to the medial margin of  specimen A.    Social History: Social History   Socioeconomic History   Marital status: Widowed    Spouse name: Not on file   Number of children: 1   Years of education: Not on file   Highest education level: GED or equivalent  Occupational History   Not on file  Tobacco Use   Smoking status: Some Days    Current packs/day: 0.50    Average packs/day: 0.5 packs/day for 25.0 years (12.5 ttl pk-yrs)    Types: Cigarettes   Smokeless tobacco: Never  Vaping Use   Vaping status: Never Used  Substance and Sexual Activity   Alcohol use: Yes    Comment: occasional   Drug use: No   Sexual activity: Not Currently  Other Topics Concern   Not on file  Social History Narrative   Not on file   Social Drivers of Health   Financial Resource Strain: Low Risk  (03/25/2020)   Overall Financial Resource Strain (CARDIA)     Difficulty of Paying Living Expenses: Not very hard  Food Insecurity: No Food Insecurity (03/25/2020)   Hunger Vital Sign    Worried About Running Out of Food in the Last Year: Never true    Ran Out of Food in the Last Year: Never true  Transportation Needs: No Transportation Needs (02/15/2021)   PRAPARE - Administrator, Civil Service (Medical): No    Lack of Transportation (Non-Medical): No  Physical Activity: Insufficiently Active (03/25/2020)   Exercise Vital Sign    Days of Exercise per Week: 2 days    Minutes of Exercise per Session: 20 min  Stress: Stress Concern Present (03/25/2020)   [employer] of Occupational Health - Occupational Stress Questionnaire    Feeling of Stress : To some extent  Social  Connections: Moderately Integrated (03/25/2020)   Social Connection and Isolation Panel [NHANES]    Frequency of Communication with Friends and Family: More than three times a week    Frequency of Social Gatherings with Friends and Family: More than three times a week    Attends Religious Services: More than 4 times per year    Active Member of [employer] or Organizations: No    Attends Engineer, structural: More than 4 times per year    Marital Status: Widowed  Intimate Partner Violence: Not At Risk (03/25/2020)   Humiliation, Afraid, Rape, and Kick questionnaire    Fear of Current or Ex-Partner: No    Emotionally Abused: No    Physically Abused: No    Sexually Abused: No    Family History: Family History  Problem Relation Age of Onset   Prostate cancer Father        dx. in his late 50s/early 80s   Breast cancer Paternal Grandmother        dx. in her 69s   Fibromyalgia Sister    Leukemia Paternal Grandfather        dx. in his 30s    Current Medications:  Current Outpatient Medications:    ALPRAZolam (XANAX) 1 MG tablet, Take 1 mg by mouth 4 (four) times daily., Disp: , Rfl:    anastrozole (ARIMIDEX) 1 MG tablet, TAKE ONE TABLET DAILY, Disp: 90  tablet, Rfl: 2   enalapril (VASOTEC) 20 MG tablet, Take 1 tablet (20 mg total) by mouth daily., Disp: 90 tablet, Rfl: 3   Fezolinetant (VEOZAH) 45 MG TABS, Take 1 tablet (45 mg total) by mouth daily., Disp: 90 tablet, Rfl: 3   fluticasone (FLONASE) 50 MCG/ACT nasal spray, Place 1 spray into both nostrils 2 (two) times daily as needed for allergies or rhinitis., Disp: 48 g, Rfl: 1   furosemide (LASIX) 40 MG tablet, Take 1 tablet (40 mg total) by mouth daily. For leg edema, Disp: 90 tablet, Rfl: 1   gabapentin (NEURONTIN) 300 MG capsule, Take 2 capsules (600 mg total) by mouth at bedtime., Disp: 60 capsule, Rfl: 6   meloxicam (MOBIC) 7.5 MG tablet, Take 1 tablet (7.5 mg total) by mouth daily., Disp: 90 tablet, Rfl: 1   mirtazapine (REMERON) 15 MG tablet, Take 15 mg by mouth at bedtime as needed (sleep). , Disp: , Rfl:    omeprazole (PRILOSEC) 20 MG capsule, Take 1 capsule (20 mg total) by mouth daily., Disp: 90 capsule, Rfl: 3   propranolol (INDERAL) 20 MG tablet, Take 20 mg by mouth daily. , Disp: , Rfl:    rizatriptan (MAXALT) 5 MG tablet, Take 1 tablet (5 mg total) by mouth as needed for migraine. May repeat in 2 hours if needed, Disp: 10 tablet, Rfl: 5   rosuvastatin (CRESTOR) 5 MG tablet, Take 1 tablet (5 mg total) by mouth daily., Disp: 90 tablet, Rfl: 3   tizanidine (ZANAFLEX) 2 MG capsule, Take 1 capsule (2 mg total) by mouth at bedtime as needed for muscle spasms., Disp: 30 capsule, Rfl: 2   venlafaxine XR (EFFEXOR-XR) 37.5 MG 24 hr capsule, Take 1 capsule (37.5 mg total) by mouth daily with breakfast. Take 1 capsule for 1 week, then take 2 capsules daily., Disp: 60 capsule, Rfl: 3 No current facility-administered medications for this visit.  Facility-Administered Medications Ordered in Other Visits:    leuprolide (LUPRON) injection 11.25 mg, 11.25 mg, Intramuscular, Once, Doreatha Massed, MD   Allergies: No Known Allergies  REVIEW OF SYSTEMS:  Review of Systems  Constitutional:   Negative for chills, fatigue and fever.  HENT:   Negative for lump/mass, mouth sores, nosebleeds, sore throat and trouble swallowing.   Eyes:  Negative for eye problems.  Respiratory:  Positive for shortness of breath. Negative for cough.   Cardiovascular:  Negative for chest pain, leg swelling and palpitations.  Gastrointestinal:  Negative for abdominal pain, constipation, diarrhea, nausea and vomiting.  Genitourinary:  Negative for bladder incontinence, difficulty urinating, dysuria, frequency, hematuria and nocturia.   Musculoskeletal:  Negative for arthralgias (Hips), back pain, flank pain, myalgias and neck pain.  Skin:  Negative for itching and rash.  Neurological:  Positive for numbness. Negative for dizziness and headaches.  Hematological:  Does not bruise/bleed easily.  Psychiatric/Behavioral:  Negative for depression, sleep disturbance and suicidal ideas. The patient is not nervous/anxious.   All other systems reviewed and are negative.    VITALS:   Blood pressure 119/86, pulse 85, temperature 98.2 F (36.8 C), temperature source Oral, resp. rate 16, weight 186 lb 3.2 oz (84.5 kg), last menstrual period 02/29/2020, SpO2 99%.  Wt Readings from Last 3 Encounters:  10/11/23 186 lb 3.2 oz (84.5 kg)  07/19/23 180 lb (81.6 kg)  05/15/23 180 lb (81.6 kg)    Body mass index is 30.05 kg/m.  Performance status (ECOG): 1 - Symptomatic but completely ambulatory  PHYSICAL EXAM:   Physical Exam Vitals and nursing note reviewed. Exam conducted with a chaperone present.  Constitutional:      Appearance: Normal appearance.  Cardiovascular:     Rate and Rhythm: Normal rate and regular rhythm.     Pulses: Normal pulses.     Heart sounds: Normal heart sounds.  Pulmonary:     Effort: Pulmonary effort is normal.     Breath sounds: Normal breath sounds.  Abdominal:     Palpations: Abdomen is soft. There is no hepatomegaly, splenomegaly or mass.     Tenderness: There is no abdominal  tenderness.  Musculoskeletal:     Right lower leg: No edema.     Left lower leg: No edema.  Lymphadenopathy:     Cervical: No cervical adenopathy.     Right cervical: No superficial, deep or posterior cervical adenopathy.    Left cervical: No superficial, deep or posterior cervical adenopathy.     Upper Body:     Right upper body: No supraclavicular or axillary adenopathy.     Left upper body: No supraclavicular or axillary adenopathy.  Neurological:     General: No focal deficit present.     Mental Status: She is alert and oriented to person, place, and time.  Psychiatric:        Mood and Affect: Mood normal.        Behavior: Behavior normal.     LABS:      Latest Ref Rng & Units 09/27/2023   11:05 AM 07/19/2023    1:44 PM 03/20/2023   10:16 AM  CBC  WBC 4.0 - 10.5 K/uL 4.5  4.3  4.7   Hemoglobin 12.0 - 15.0 g/dL 84.1  32.4  40.1   Hematocrit 36.0 - 46.0 % 39.4  39.6  38.3   Platelets 150 - 400 K/uL 291  288  298       Latest Ref Rng & Units 09/27/2023   11:05 AM 07/19/2023    1:44 PM 03/20/2023   10:16 AM  CMP  Glucose 70 - 99 mg/dL 94  97  027   BUN 6 -  20 mg/dL 13  9  15    Creatinine 0.44 - 1.00 mg/dL 1.61  0.96  0.45   Sodium 135 - 145 mmol/L 138  142  138   Potassium 3.5 - 5.1 mmol/L 3.7  3.8  3.8   Chloride 98 - 111 mmol/L 100  101  102   CO2 22 - 32 mmol/L 27  25  28    Calcium 8.9 - 10.3 mg/dL 40.9  81.1  9.3   Total Protein 6.5 - 8.1 g/dL 7.7  6.9  7.5   Total Bilirubin <1.2 mg/dL 0.5  0.4  0.4   Alkaline Phos 38 - 126 U/L 95  107  95   AST 15 - 41 U/L 26  20  29    ALT 0 - 44 U/L 27  21  31       No results found for: "CEA1", "CEA" / No results found for: "CEA1", "CEA" No results found for: "PSA1" No results found for: "BJY782" No results found for: "CAN125"  No results found for: "TOTALPROTELP", "ALBUMINELP", "A1GS", "A2GS", "BETS", "BETA2SER", "GAMS", "MSPIKE", "SPEI" Lab Results  Component Value Date   FERRITIN 48 12/06/2015   No results found for:  "LDH"   STUDIES:   No results found.

## 2023-10-11 ENCOUNTER — Inpatient Hospital Stay: Payer: Medicaid Other | Admitting: Hematology

## 2023-10-11 ENCOUNTER — Encounter: Payer: Self-pay | Admitting: Hematology

## 2023-10-11 VITALS — BP 119/86 | HR 85 | Temp 98.2°F | Resp 16 | Wt 186.2 lb

## 2023-10-11 DIAGNOSIS — Z803 Family history of malignant neoplasm of breast: Secondary | ICD-10-CM | POA: Diagnosis not present

## 2023-10-11 DIAGNOSIS — Z170 Estrogen receptor positive status [ER+]: Secondary | ICD-10-CM | POA: Diagnosis not present

## 2023-10-11 DIAGNOSIS — Z8042 Family history of malignant neoplasm of prostate: Secondary | ICD-10-CM | POA: Diagnosis not present

## 2023-10-11 DIAGNOSIS — Z79811 Long term (current) use of aromatase inhibitors: Secondary | ICD-10-CM | POA: Diagnosis not present

## 2023-10-11 DIAGNOSIS — R232 Flushing: Secondary | ICD-10-CM | POA: Diagnosis not present

## 2023-10-11 DIAGNOSIS — C50912 Malignant neoplasm of unspecified site of left female breast: Secondary | ICD-10-CM | POA: Diagnosis not present

## 2023-10-11 DIAGNOSIS — Z9012 Acquired absence of left breast and nipple: Secondary | ICD-10-CM | POA: Diagnosis not present

## 2023-10-11 DIAGNOSIS — F17210 Nicotine dependence, cigarettes, uncomplicated: Secondary | ICD-10-CM | POA: Diagnosis not present

## 2023-10-11 DIAGNOSIS — Z806 Family history of leukemia: Secondary | ICD-10-CM | POA: Diagnosis not present

## 2023-10-11 DIAGNOSIS — Z1721 Progesterone receptor positive status: Secondary | ICD-10-CM | POA: Diagnosis not present

## 2023-10-11 NOTE — Patient Instructions (Signed)
[facility] at [facility] Discharge Instructions   You were seen and examined today by Dr. Ellin Saba.  He reviewed the results of your lab work which are normal.   Continue anastrozole as prescribed.   We will see you back in June. We will repeat a mammogram, lab work prior to this visit.   Return as scheduled.    Thank you for choosing [facility] at [facility] to provide your oncology and hematology care.  To afford each patient quality time with our provider, please arrive at least 15 minutes before your scheduled appointment time.   If you have a lab appointment with the Cancer Center please come in thru the Main Entrance and check in at the main information desk.  You need to re-schedule your appointment should you arrive 10 or more minutes late.  We strive to give you quality time with our providers, and arriving late affects you and other patients whose appointments are after yours.  Also, if you no show three or more times for appointments you may be dismissed from the clinic at the providers discretion.     Again, thank you for choosing [facility].  Our hope is that these requests will decrease the amount of time that you wait before being seen by our physicians.       _____________________________________________________________  Should you have questions after your visit to [facility], please contact our office at [phone] and follow the prompts.  Our office hours are 8:00 a.m. and 4:30 p.m. Monday - Friday.  Please note that voicemails left after 4:00 p.m. may not be returned until the following business day.  We are closed weekends and major holidays.  You do have access to a nurse 24-7, just call the main number to the clinic [phone] and do not press any options, hold on the line and a nurse will answer the phone.    For prescription refill requests, have your pharmacy contact our office and  allow 72 hours.    Due to Covid, you will need to wear a mask upon entering the hospital. If you do not have a mask, a mask will be given to you at the Main Entrance upon arrival. For doctor visits, patients may have 1 support person age 4 or older with them. For treatment visits, patients can not have anyone with them due to social distancing guidelines and our immunocompromised population.

## 2023-10-18 ENCOUNTER — Encounter (HOSPITAL_COMMUNITY): Payer: Self-pay | Admitting: Hematology

## 2023-10-26 DIAGNOSIS — R112 Nausea with vomiting, unspecified: Secondary | ICD-10-CM | POA: Diagnosis not present

## 2023-10-26 DIAGNOSIS — R051 Acute cough: Secondary | ICD-10-CM | POA: Diagnosis not present

## 2023-11-15 ENCOUNTER — Inpatient Hospital Stay: Payer: Medicaid Other

## 2023-11-15 ENCOUNTER — Inpatient Hospital Stay: Payer: Medicaid Other | Attending: Hematology

## 2023-11-15 VITALS — BP 113/91 | HR 80 | Temp 97.9°F | Resp 18

## 2023-11-15 DIAGNOSIS — Z79818 Long term (current) use of other agents affecting estrogen receptors and estrogen levels: Secondary | ICD-10-CM | POA: Insufficient documentation

## 2023-11-15 DIAGNOSIS — Z170 Estrogen receptor positive status [ER+]: Secondary | ICD-10-CM | POA: Diagnosis not present

## 2023-11-15 DIAGNOSIS — C50912 Malignant neoplasm of unspecified site of left female breast: Secondary | ICD-10-CM | POA: Insufficient documentation

## 2023-11-15 MED ORDER — LEUPROLIDE ACETATE (3 MONTH) 11.25 MG IM KIT
11.2500 mg | PACK | Freq: Once | INTRAMUSCULAR | Status: AC
  Administered 2023-11-15: 11.25 mg via INTRAMUSCULAR
  Filled 2023-11-15: qty 11.25

## 2023-11-15 NOTE — Patient Instructions (Signed)
[facility] [place] - A DEPT OF [facility]  Discharge Instructions: Thank you for choosing  Cancer Center to provide your oncology and hematology care.  If you have a lab appointment with the Cancer Center - please note that after April 8th, 2024, all labs will be drawn in the cancer center.  You do not have to check in or register with the main entrance as you have in the past but will complete your check-in in the cancer center.  Wear comfortable clothing and clothing appropriate for easy access to any Portacath or PICC line.   We strive to give you quality time with your provider. You may need to reschedule your appointment if you arrive late (15 or more minutes).  Arriving late affects you and other patients whose appointments are after yours.  Also, if you miss three or more appointments without notifying the office, you may be dismissed from the clinic at the provider's discretion.      For prescription refill requests, have your pharmacy contact our office and allow 72 hours for refills to be completed.    Today you received the following chemotherapy and/or immunotherapy agents: Lupron.   Leuprolide Solution for Injection What is this medication? LEUPROLIDE (loo PROE lide) reduces the symptoms of prostate cancer. It works by decreasing levels of the hormone testosterone in the body. This prevents prostate cancer cells from spreading or growing. This medicine may be used for other purposes; ask your health care provider or pharmacist if you have questions. COMMON BRAND NAME(S): Lupron What should I tell my care team before I take this medication? They need to know if you have any of these conditions: Diabetes Heart attack Heart disease High blood pressure High cholesterol Pain or difficulty passing urine Spinal cord metastasis Stroke Tobacco use An unusual or allergic reaction to leuprolide, other medications, foods, dyes, or  preservatives Pregnant or trying to get pregnant Breast-feeding How should I use this medication? This medication is for injection under the skin or into a muscle. You will be taught how to prepare and give this medication. Use exactly as directed. Take your medication at regular intervals. Do not take it more often than directed. It is important that you put your used needles and syringes in a special sharps container. Do not put them in a trash can. If you do not have a sharps container, call your care team to get one. A special MedGuide will be given to you by the pharmacist with each prescription and refill. Be sure to read this information carefully each time. Talk to your care team about the use of this medication in children. While this medication may be prescribed for children as young as 8 years for selected conditions, precautions do apply. Overdosage: If you think you have taken too much of this medicine contact a poison control center or emergency room at once. NOTE: This medicine is only for you. Do not share this medicine with others. What if I miss a dose? If you miss a dose, take it as soon as you can. If it is almost time for your next dose, take only that dose. Do not take double or extra doses. What may interact with this medication? Do not take this medication with any of the following: Chasteberry Cisapride Dronedarone Pimozide Thioridazine This medication may also interact with the following: Estrogen or progestin hormones Herbal or dietary supplements, like black cohosh or DHEA Other medications that cause heart rhythm  changes Testosterone This list may not describe all possible interactions. Give your health care provider a list of all the medicines, herbs, non-prescription drugs, or dietary supplements you use. Also tell them if you smoke, drink alcohol, or use illegal drugs. Some items may interact with your medicine. What should I watch for while using this  medication? Visit your care team for regular checks on your progress. During the first week, your symptoms may get worse, but then will improve as you continue your treatment. You may get hot flashes, increased bone pain, increased difficulty passing urine, or an aggravation of nerve symptoms. Discuss these effects with your care team, some of them may improve with continued use of this medication. Patients may experience a menstrual cycle or spotting during the first 2 months of therapy with this medication. If this continues, contact your care team. This medication may increase blood sugar. The risk may be higher in patients who already have diabetes. Ask your care team what you can do to lower your risk of diabetes while taking this medication. What side effects may I notice from receiving this medication? Side effects that you should report to your care team as soon as possible: Allergic reactions--skin rash, itching, hives, swelling of the face, lips, tongue, or throat Heart attack--pain or tightness in the chest, shoulders, arms, or jaw, nausea, shortness of breath, cold or clammy skin, feeling faint or lightheaded Heart rhythm changes--fast or irregular heartbeat, dizziness, feeling faint or lightheaded, chest pain, trouble breathing High blood sugar (hyperglycemia)--increased thirst or amount of urine, unusual weakness or fatigue, blurry vision New or worsening seizures Redness, blistering, peeling, or loosening of the skin, including inside the mouth Stroke--sudden numbness or weakness of the face, arm, or leg, trouble speaking, confusion, trouble walking, loss of balance or coordination, dizziness, severe headache, change in vision Swelling and pain of the tumor site or lymph nodes Side effects that usually do not require medical attention (report these to your care team if they continue or are bothersome): Change in sex drive or performance Hot flashes Joint pain Pain, redness, or  irritation at injection site Swelling of the ankles, hands, or feet Unusual weakness or fatigue This list may not describe all possible side effects. Call your doctor for medical advice about side effects. You may report side effects to FDA at 1-800-FDA-1088. Where should I keep my medication? Keep out of the reach of children and pets. Store below 25 degrees C (77 degrees F). Do not freeze. Protect from light. Get rid of any unused medication after the expiration date. To get rid of medications that are no longer needed or have expired: Take the medication to a medication take-back program. Check with your pharmacy or law enforcement to find a location. If you cannot return the medication, ask your pharmacist or care team how to get rid of this medication safely. NOTE: This sheet is a summary. It may not cover all possible information. If you have questions about this medicine, talk to your doctor, pharmacist, or health care provider.  2024 Elsevier/Gold Standard (2023-09-14 00:00:00)       To help prevent nausea and vomiting after your treatment, we encourage you to take your nausea medication as directed.  BELOW ARE SYMPTOMS THAT SHOULD BE REPORTED IMMEDIATELY: *FEVER GREATER THAN 100.4 F (38 C) OR HIGHER *CHILLS OR SWEATING *NAUSEA AND VOMITING THAT IS NOT CONTROLLED WITH YOUR NAUSEA MEDICATION *UNUSUAL SHORTNESS OF BREATH *UNUSUAL BRUISING OR BLEEDING *URINARY PROBLEMS (pain or burning when urinating, or  frequent urination) *BOWEL PROBLEMS (unusual diarrhea, constipation, pain near the anus) TENDERNESS IN MOUTH AND THROAT WITH OR WITHOUT PRESENCE OF ULCERS (sore throat, sores in mouth, or a toothache) UNUSUAL RASH, SWELLING OR PAIN  UNUSUAL VAGINAL DISCHARGE OR ITCHING   Items with * indicate a potential emergency and should be followed up as soon as possible or go to the Emergency Department if any problems should occur.  Please show the CHEMOTHERAPY ALERT CARD or IMMUNOTHERAPY  ALERT CARD at check-in to the Emergency Department and triage nurse.  Should you have questions after your visit or need to cancel or reschedule your appointment, please contact [facility] [place] - A DEPT OF [facility] [phone]  and follow the prompts.  Office hours are 8:00 a.m. to 4:30 p.m. Monday - Friday. Please note that voicemails left after 4:00 p.m. may not be returned until the following business day.  We are closed weekends and major holidays. You have access to a nurse at all times for urgent questions. Please call the main number to the clinic [phone] and follow the prompts.  For any non-urgent questions, you may also contact your provider using MyChart. We now offer e-Visits for anyone 17 and older to request care online for non-urgent symptoms. For details visit [email or web address].   Also download the MyChart app! Go to the app store, search "MyChart", open the app, select [place], and log in with your MyChart username and password.

## 2023-11-15 NOTE — Progress Notes (Signed)
Lupron injection given per orders. Patient tolerated it well without problems. Vitals stable and discharged home from clinic ambulatory. Follow up as scheduled.

## 2024-01-09 ENCOUNTER — Encounter (HOSPITAL_COMMUNITY): Payer: Self-pay | Admitting: Hematology

## 2024-01-09 ENCOUNTER — Ambulatory Visit: Admitting: Family Medicine

## 2024-01-09 ENCOUNTER — Encounter: Payer: Self-pay | Admitting: Family Medicine

## 2024-01-09 VITALS — BP 120/86 | HR 104 | Temp 97.8°F | Ht 66.0 in | Wt 181.0 lb

## 2024-01-09 DIAGNOSIS — M25552 Pain in left hip: Secondary | ICD-10-CM

## 2024-01-09 DIAGNOSIS — M25551 Pain in right hip: Secondary | ICD-10-CM

## 2024-01-09 DIAGNOSIS — A084 Viral intestinal infection, unspecified: Secondary | ICD-10-CM | POA: Diagnosis not present

## 2024-01-09 MED ORDER — ONDANSETRON 4 MG PO TBDP: 4.0000 mg | ORAL_TABLET | Freq: Three times a day (TID) | ORAL | 0 refills | Status: AC | PRN

## 2024-01-09 MED ORDER — PREDNISONE 10 MG PO TABS: ORAL_TABLET | ORAL | 0 refills | Status: DC

## 2024-01-09 NOTE — Progress Notes (Signed)
 Subjective:  Patient ID: Doris Newman, female    DOB: [birth year]  Age: 53 y.o. MRN: [id number]  CC: Upset stomach  (Upset stomach last night. Had to call out of work this morning and last night. Needs note. ) and Hip Pain (Hip and upper leg pain. Hurts on and off mostly after sleeping. Some numbness.)   HPI Doris Newman presents for vomiting, diarrhea last night and early AM. Somewhat better at presentation. Taking gatorade. Appetite decreased. No fever chils.   Both hips hurt when she wakes up. Both hurt. 5-6/10. Lasts 10-15 minutes. Onset 1 month ago.      01/09/2024   12:20 PM 07/19/2023    2:09 PM 02/13/2023    4:08 PM  Depression screen PHQ 2/9  Decreased Interest 1 0 0  Down, Depressed, Hopeless 0 0 0  PHQ - 2 Score 1 0 0  Altered sleeping 0 0 0  Tired, decreased energy 0 0 1  Change in appetite 0 0 0  Feeling bad or failure about yourself  0 0 0  Trouble concentrating 0 0 0  Moving slowly or fidgety/restless 0 0 0  Suicidal thoughts 0 0 0  PHQ-9 Score 1 0 1  Difficult doing work/chores  Not difficult at all Not difficult at all    History Doris Newman has a past medical history of Anxiety, Family history of adverse reaction to anesthesia, Family history of breast cancer, Family history of leukemia, Family history of prostate cancer, GERD (gastroesophageal reflux disease), Hypertension, and Personal history of radiation therapy.   She has a past surgical history that includes LEEP; Partial mastectomy with needle localization and axillary sentinel lymph node bx (Left, 03/08/2020); and Breast lumpectomy (Left, 03/08/2020).   Her family history includes Breast cancer in her paternal grandmother; Fibromyalgia in her sister; Leukemia in her paternal grandfather; Prostate cancer in her father.She reports that she has been smoking cigarettes. She has a 12.5 pack-year smoking history. She has never used smokeless tobacco. She reports current alcohol use. She reports that she  does not use drugs.    ROS Review of Systems  Gastrointestinal:  Positive for nausea and vomiting.  Musculoskeletal:  Positive for arthralgias.  Neurological:  Positive for headaches.    Objective:  BP 120/86   Pulse (!) 104   Temp 97.8 F (36.6 C)   Ht 5\' 6"  (1.676 m)   Wt 181 lb (82.1 kg)   LMP 02/29/2020   SpO2 94%   BMI 29.21 kg/m   BP Readings from Last 3 Encounters:  01/09/24 120/86  11/15/23 (!) 113/91  10/11/23 119/86    Wt Readings from Last 3 Encounters:  01/09/24 181 lb (82.1 kg)  10/11/23 186 lb 3.2 oz (84.5 kg)  07/19/23 180 lb (81.6 kg)     Physical Exam Constitutional:      General: She is not in acute distress.    Appearance: She is well-developed.  Cardiovascular:     Rate and Rhythm: Normal rate and regular rhythm.  Pulmonary:     Breath sounds: Normal breath sounds.  Musculoskeletal:        General: Normal range of motion.  Skin:    General: Skin is warm and dry.  Neurological:     Mental Status: She is alert and oriented to person, place, and time.      Assessment & Plan:  Viral gastroenteritis  Bilateral hip pain  Other orders -     Ondansetron; Take 1 tablet (4 mg total) by mouth  3 (three) times daily as needed.  Dispense: 20 tablet; Refill: 0 -     predniSONE; Take 5 daily for 3 days followed by 4,3,2 and 1 for 3 days each.  Dispense: 45 tablet; Refill: 0     Follow-up: Return if symptoms worsen or fail to improve.  Mechele Claude, M.D.

## 2024-01-17 ENCOUNTER — Other Ambulatory Visit: Payer: Self-pay | Admitting: Family Medicine

## 2024-01-17 DIAGNOSIS — R600 Localized edema: Secondary | ICD-10-CM

## 2024-01-17 DIAGNOSIS — K219 Gastro-esophageal reflux disease without esophagitis: Secondary | ICD-10-CM

## 2024-01-17 DIAGNOSIS — I10 Essential (primary) hypertension: Secondary | ICD-10-CM

## 2024-01-18 ENCOUNTER — Ambulatory Visit: Payer: BC Managed Care – PPO | Admitting: Family Medicine

## 2024-01-24 ENCOUNTER — Encounter: Payer: Self-pay | Admitting: Family Medicine

## 2024-01-24 ENCOUNTER — Ambulatory Visit: Payer: BC Managed Care – PPO | Admitting: Family Medicine

## 2024-01-24 VITALS — BP 137/86 | HR 111 | Temp 98.2°F | Ht 66.0 in | Wt 180.0 lb

## 2024-01-24 DIAGNOSIS — R600 Localized edema: Secondary | ICD-10-CM

## 2024-01-24 DIAGNOSIS — K219 Gastro-esophageal reflux disease without esophagitis: Secondary | ICD-10-CM | POA: Diagnosis not present

## 2024-01-24 DIAGNOSIS — G4489 Other headache syndrome: Secondary | ICD-10-CM

## 2024-01-24 DIAGNOSIS — E7800 Pure hypercholesterolemia, unspecified: Secondary | ICD-10-CM | POA: Diagnosis not present

## 2024-01-24 DIAGNOSIS — I10 Essential (primary) hypertension: Secondary | ICD-10-CM | POA: Diagnosis not present

## 2024-01-24 LAB — LIPID PANEL

## 2024-01-24 MED ORDER — ROSUVASTATIN CALCIUM 5 MG PO TABS: 5.0000 mg | ORAL_TABLET | Freq: Every day | ORAL | 3 refills | Status: AC

## 2024-01-24 MED ORDER — TIZANIDINE HCL 2 MG PO CAPS: 2.0000 mg | ORAL_CAPSULE | Freq: Every evening | ORAL | 2 refills | Status: AC | PRN

## 2024-01-24 MED ORDER — ENALAPRIL MALEATE 20 MG PO TABS: 20.0000 mg | ORAL_TABLET | Freq: Every day | ORAL | 3 refills | Status: DC

## 2024-01-24 MED ORDER — OMEPRAZOLE 20 MG PO CPDR
20.0000 mg | DELAYED_RELEASE_CAPSULE | Freq: Every day | ORAL | 3 refills | Status: AC
Start: 2024-01-24 — End: ?

## 2024-01-24 MED ORDER — RIZATRIPTAN BENZOATE 5 MG PO TABS
5.0000 mg | ORAL_TABLET | ORAL | 5 refills | Status: AC | PRN
Start: 2024-01-24 — End: ?

## 2024-01-24 MED ORDER — FUROSEMIDE 40 MG PO TABS
40.0000 mg | ORAL_TABLET | Freq: Every day | ORAL | 3 refills | Status: AC
Start: 2024-01-24 — End: ?

## 2024-01-24 MED ORDER — MELOXICAM 7.5 MG PO TABS: 7.5000 mg | ORAL_TABLET | Freq: Every day | ORAL | 3 refills | Status: AC

## 2024-01-24 NOTE — Progress Notes (Signed)
 BP 137/86   Pulse (!) 111   Temp 98.2 F (36.8 C)   Ht 5\' 6"  (1.676 m)   Wt 180 lb (81.6 kg)   LMP 02/29/2020   SpO2 98%   BMI 29.05 kg/m    Subjective:   Patient ID: Doris Newman, female    DOB: 10-14-1971, 53 y.o.   MRN: [id number]  HPI: Doris Newman is a 53 y.o. female presenting on 01/24/2024 for Medical Management of Chronic Issues, Hyperlipidemia, and Hypertension   HPI Hypertension Patient is currently on enalapril and furosemide, and their blood pressure today is 137/86. Patient denies any lightheadedness or dizziness. Patient denies headaches, blurred vision, chest pains, shortness of breath, or weakness. Denies any side effects from medication and is content with current medication.   Hyperlipidemia Patient is coming in for recheck of his hyperlipidemia. The patient is currently taking rosuvastin. They deny any issues with myalgias or history of liver damage from it. They deny any focal numbness or weakness or chest pain.   GERD Patient is currently on omeprazole.  She denies any major symptoms or abdominal pain or belching or burping. She denies any blood in her stool or lightheadedness or dizziness.   Relevant past medical, surgical, family and social history reviewed and updated as indicated. Interim medical history since our last visit reviewed. Allergies and medications reviewed and updated.  Review of Systems  Constitutional:  Negative for chills and fever.  Eyes:  Negative for visual disturbance.  Respiratory:  Negative for chest tightness and shortness of breath.   Cardiovascular:  Negative for chest pain and leg swelling.  Genitourinary:  Negative for difficulty urinating and dysuria.  Musculoskeletal:  Negative for back pain and gait problem.  Skin:  Negative for rash.  Neurological:  Negative for dizziness, light-headedness and headaches.  Psychiatric/Behavioral:  Negative for agitation and behavioral problems.   All other systems reviewed  and are negative.   Per HPI unless specifically indicated above   Allergies as of 01/24/2024   No Known Allergies      Medication List        Accurate as of January 24, 2024  1:34 PM. If you have any questions, ask your nurse or doctor.          STOP taking these medications    predniSONE 10 MG tablet Commonly known as: DELTASONE Stopped by: Elige Radon Graycee Greeson       TAKE these medications    ALPRAZolam 1 MG tablet Commonly known as: XANAX Take 1 mg by mouth 4 (four) times daily.   anastrozole 1 MG tablet Commonly known as: ARIMIDEX TAKE ONE TABLET DAILY   benzonatate 200 MG capsule Commonly known as: TESSALON Take 200 mg by mouth 3 (three) times daily as needed.   enalapril 20 MG tablet Commonly known as: VASOTEC Take 1 tablet (20 mg total) by mouth daily.   fluticasone 50 MCG/ACT nasal spray Commonly known as: FLONASE Place 1 spray into both nostrils 2 (two) times daily as needed for allergies or rhinitis.   furosemide 40 MG tablet Commonly known as: LASIX Take 1 tablet (40 mg total) by mouth daily. What changed: See the new instructions. Changed by: Elige Radon Darlin Stenseth   gabapentin 300 MG capsule Commonly known as: NEURONTIN Take 2 capsules (600 mg total) by mouth at bedtime.   meloxicam 7.5 MG tablet Commonly known as: Mobic Take 1 tablet (7.5 mg total) by mouth daily.   mirtazapine 15 MG tablet Commonly known as: REMERON Take  15 mg by mouth at bedtime as needed (sleep).   omeprazole 20 MG capsule Commonly known as: PRILOSEC Take 1 capsule (20 mg total) by mouth daily.   ondansetron 4 MG disintegrating tablet Commonly known as: ZOFRAN-ODT Take 1 tablet (4 mg total) by mouth 3 (three) times daily as needed.   propranolol 20 MG tablet Commonly known as: INDERAL Take 20 mg by mouth daily.   rizatriptan 5 MG tablet Commonly known as: MAXALT Take 1 tablet (5 mg total) by mouth as needed for migraine. May repeat in 2 hours if needed    rosuvastatin 5 MG tablet Commonly known as: CRESTOR Take 1 tablet (5 mg total) by mouth daily.   tizanidine 2 MG capsule Commonly known as: ZANAFLEX Take 1 capsule (2 mg total) by mouth at bedtime as needed for muscle spasms.   venlafaxine XR 37.5 MG 24 hr capsule Commonly known as: EFFEXOR-XR Take 1 capsule (37.5 mg total) by mouth daily with breakfast. Take 1 capsule for 1 week, then take 2 capsules daily.   Veozah 45 MG Tabs Generic drug: Fezolinetant Take 1 tablet (45 mg total) by mouth daily.         Objective:   BP 137/86   Pulse (!) 111   Temp 98.2 F (36.8 C)   Ht 5\' 6"  (1.676 m)   Wt 180 lb (81.6 kg)   LMP 02/29/2020   SpO2 98%   BMI 29.05 kg/m   Wt Readings from Last 3 Encounters:  01/24/24 180 lb (81.6 kg)  01/09/24 181 lb (82.1 kg)  10/11/23 186 lb 3.2 oz (84.5 kg)    Physical Exam Vitals and nursing note reviewed.  Constitutional:      General: She is not in acute distress.    Appearance: She is well-developed. She is not diaphoretic.  Eyes:     Conjunctiva/sclera: Conjunctivae normal.  Cardiovascular:     Rate and Rhythm: Normal rate and regular rhythm.     Heart sounds: Normal heart sounds. No murmur heard. Pulmonary:     Effort: Pulmonary effort is normal. No respiratory distress.     Breath sounds: Normal breath sounds. No wheezing.  Abdominal:     General: Abdomen is flat. Bowel sounds are normal. There is no distension.     Palpations: Abdomen is soft.     Tenderness: There is no abdominal tenderness. There is no guarding or rebound.  Musculoskeletal:        General: No tenderness. Normal range of motion.  Skin:    General: Skin is warm and dry.     Findings: No rash.  Neurological:     Mental Status: She is alert and oriented to person, place, and time.     Coordination: Coordination normal.  Psychiatric:        Behavior: Behavior normal.       Assessment & Plan:   Problem List Items Addressed This Visit        Cardiovascular and Mediastinum   Essential hypertension   Relevant Medications   enalapril (VASOTEC) 20 MG tablet   furosemide (LASIX) 40 MG tablet   rosuvastatin (CRESTOR) 5 MG tablet   Other Relevant Orders   CBC with Differential/Platelet   CMP14+EGFR     Digestive   Gastroesophageal reflux disease without esophagitis   Relevant Medications   omeprazole (PRILOSEC) 20 MG capsule   Other Relevant Orders   CBC with Differential/Platelet   CMP14+EGFR     Other   Hyperlipidemia - Primary  Relevant Medications   enalapril (VASOTEC) 20 MG tablet   furosemide (LASIX) 40 MG tablet   rosuvastatin (CRESTOR) 5 MG tablet   Other Relevant Orders   CBC with Differential/Platelet   CMP14+EGFR   Lipid panel   Headache syndrome   Relevant Medications   meloxicam (MOBIC) 7.5 MG tablet   rizatriptan (MAXALT) 5 MG tablet   tizanidine (ZANAFLEX) 2 MG capsule   Other Visit Diagnoses       Bilateral lower extremity edema       Relevant Medications   furosemide (LASIX) 40 MG tablet   Other Relevant Orders   CMP14+EGFR     Blood pressure looks good on recheck, everything else looks good.  Will check blood work today.  No change in medication.  Follow up plan: Return in about 6 months (around 07/25/2024), or if symptoms worsen or fail to improve, for Hypertension and hld.  Counseling provided for all of the vaccine components Orders Placed This Encounter  Procedures   CBC with Differential/Platelet   CMP14+EGFR   Lipid panel    Arville Care, MD [facility] 01/24/2024, 1:34 PM

## 2024-01-25 ENCOUNTER — Encounter: Payer: Self-pay | Admitting: Family Medicine

## 2024-01-25 LAB — CBC WITH DIFFERENTIAL/PLATELET
Basophils Absolute: 0 10*3/uL (ref 0.0–0.2)
Basos: 0 %
EOS (ABSOLUTE): 0.1 10*3/uL (ref 0.0–0.4)
Eos: 1 %
Hematocrit: 39.6 % (ref 34.0–46.6)
Hemoglobin: 13 g/dL (ref 11.1–15.9)
Immature Grans (Abs): 0 10*3/uL (ref 0.0–0.1)
Immature Granulocytes: 0 %
Lymphocytes Absolute: 2 10*3/uL (ref 0.7–3.1)
Lymphs: 28 %
MCH: 30.2 pg (ref 26.6–33.0)
MCHC: 32.8 g/dL (ref 31.5–35.7)
MCV: 92 fL (ref 79–97)
Monocytes Absolute: 0.5 10*3/uL (ref 0.1–0.9)
Monocytes: 7 %
Neutrophils Absolute: 4.6 10*3/uL (ref 1.4–7.0)
Neutrophils: 64 %
Platelets: 286 10*3/uL (ref 150–450)
RBC: 4.31 x10E6/uL (ref 3.77–5.28)
RDW: 13.4 % (ref 11.7–15.4)
WBC: 7.2 10*3/uL (ref 3.4–10.8)

## 2024-01-25 LAB — LIPID PANEL
Cholesterol, Total: 193 mg/dL (ref 100–199)
HDL: 87 mg/dL (ref >39)
LDL CALC COMMENT:: 2.2 ratio (ref 0.0–4.4)
LDL Chol Calc (NIH): 76 mg/dL (ref 0–99)
Triglycerides: 187 mg/dL — ABNORMAL HIGH (ref 0–149)
VLDL Cholesterol Cal: 30 mg/dL (ref 5–40)

## 2024-01-25 LAB — CMP14+EGFR
ALT: 31 IU/L (ref 0–32)
AST: 22 IU/L (ref 0–40)
Albumin: 4.4 g/dL (ref 3.8–4.9)
Alkaline Phosphatase: 91 IU/L (ref 44–121)
BUN/Creatinine Ratio: 18 (ref 9–23)
BUN: 15 mg/dL (ref 6–24)
Bilirubin Total: 0.4 mg/dL (ref 0.0–1.2)
CO2: 26 mmol/L (ref 20–29)
Calcium: 9.7 mg/dL (ref 8.7–10.2)
Chloride: 98 mmol/L (ref 96–106)
Creatinine, Ser: 0.84 mg/dL (ref 0.57–1.00)
Globulin, Total: 2.2 g/dL (ref 1.5–4.5)
Glucose: 88 mg/dL (ref 70–99)
Potassium: 3.7 mmol/L (ref 3.5–5.2)
Sodium: 140 mmol/L (ref 134–144)
Total Protein: 6.6 g/dL (ref 6.0–8.5)
eGFR: 83 mL/min/{1.73_m2} (ref >59)

## 2024-02-14 ENCOUNTER — Ambulatory Visit: Payer: Medicaid Other

## 2024-02-21 ENCOUNTER — Inpatient Hospital Stay: Payer: Medicaid Other | Attending: Hematology

## 2024-02-21 ENCOUNTER — Encounter (HOSPITAL_COMMUNITY): Payer: Self-pay | Admitting: Hematology

## 2024-02-21 VITALS — BP 158/75 | HR 99 | Temp 98.8°F | Resp 20

## 2024-02-21 DIAGNOSIS — Z79818 Long term (current) use of other agents affecting estrogen receptors and estrogen levels: Secondary | ICD-10-CM | POA: Insufficient documentation

## 2024-02-21 DIAGNOSIS — C50912 Malignant neoplasm of unspecified site of left female breast: Secondary | ICD-10-CM | POA: Insufficient documentation

## 2024-02-21 MED ORDER — LEUPROLIDE ACETATE (3 MONTH) 11.25 MG IM KIT
11.2500 mg | PACK | Freq: Once | INTRAMUSCULAR | Status: AC
Start: 2024-02-21 — End: 2024-02-21
  Administered 2024-02-21: 11.25 mg via INTRAMUSCULAR
  Filled 2024-02-21: qty 11.25

## 2024-02-21 NOTE — Patient Instructions (Signed)
 [facility] [place] - A DEPT OF [facility]  Discharge Instructions: Thank you for choosing [facility] to provide your oncology and hematology care.  If you have a lab appointment with the Cancer Center - please note that after April 8th, 2024, all labs will be drawn in the cancer center.  You do not have to check in or register with the main entrance as you have in the past but will complete your check-in in the cancer center.  Wear comfortable clothing and clothing appropriate for easy access to any Portacath or PICC line.   We strive to give you quality time with your provider. You may need to reschedule your appointment if you arrive late (15 or more minutes).  Arriving late affects you and other patients whose appointments are after yours.  Also, if you miss three or more appointments without notifying the office, you may be dismissed from the clinic at the provider's discretion.      For prescription refill requests, have your pharmacy contact our office and allow 72 hours for refills to be completed.    Today you received Lupron  injection     BELOW ARE SYMPTOMS THAT SHOULD BE REPORTED IMMEDIATELY: *FEVER GREATER THAN 100.4 F (38 C) OR HIGHER *CHILLS OR SWEATING *NAUSEA AND VOMITING THAT IS NOT CONTROLLED WITH YOUR NAUSEA MEDICATION *UNUSUAL SHORTNESS OF BREATH *UNUSUAL BRUISING OR BLEEDING *URINARY PROBLEMS (pain or burning when urinating, or frequent urination) *BOWEL PROBLEMS (unusual diarrhea, constipation, pain near the anus) TENDERNESS IN MOUTH AND THROAT WITH OR WITHOUT PRESENCE OF ULCERS (sore throat, sores in mouth, or a toothache) UNUSUAL RASH, SWELLING OR PAIN  UNUSUAL VAGINAL DISCHARGE OR ITCHING   Items with * indicate a potential emergency and should be followed up as soon as possible or go to the Emergency Department if any problems should occur.  Please show the CHEMOTHERAPY ALERT CARD or IMMUNOTHERAPY ALERT CARD at check-in  to the Emergency Department and triage nurse.  Should you have questions after your visit or need to cancel or reschedule your appointment, please contact [facility]  - A DEPT OF [facility] [phone]  and follow the prompts.  Office hours are 8:00 a.m. to 4:30 p.m. Monday - Friday. Please note that voicemails left after 4:00 p.m. may not be returned until the following business day.  We are closed weekends and major holidays. You have access to a nurse at all times for urgent questions. Please call the main number to the clinic [phone] and follow the prompts.  For any non-urgent questions, you may also contact your provider using MyChart. We now offer e-Visits for anyone 36 and older to request care online for non-urgent symptoms. For details visit [email or web address].   Also download the MyChart app! Go to the app store, search "MyChart", open the app, select [place], and log in with your MyChart username and password.

## 2024-02-21 NOTE — Progress Notes (Signed)
 Doris Newman presents today for injection per the provider's orders.  Lupron   administration without incident; injection site WNL; see MAR for injection details.  Patient tolerated procedure well and without incident.  No questions or complaints noted at this time.   Discharged from clinic ambulatory in stable condition. Alert and oriented x 3. F/U with [facility] as scheduled.

## 2024-03-21 ENCOUNTER — Other Ambulatory Visit: Payer: Medicaid Other

## 2024-03-21 ENCOUNTER — Ambulatory Visit (HOSPITAL_COMMUNITY): Payer: Medicaid Other

## 2024-03-27 ENCOUNTER — Encounter (HOSPITAL_COMMUNITY): Payer: Self-pay | Admitting: Hematology

## 2024-03-31 ENCOUNTER — Ambulatory Visit: Payer: Medicaid Other | Admitting: Hematology

## 2024-04-01 ENCOUNTER — Other Ambulatory Visit: Payer: Self-pay | Admitting: *Deleted

## 2024-04-01 MED ORDER — VEOZAH 45 MG PO TABS: 45.0000 mg | ORAL_TABLET | Freq: Every day | ORAL | 3 refills | Status: DC

## 2024-04-02 ENCOUNTER — Other Ambulatory Visit: Payer: Self-pay

## 2024-04-02 DIAGNOSIS — C50912 Malignant neoplasm of unspecified site of left female breast: Secondary | ICD-10-CM

## 2024-04-03 ENCOUNTER — Encounter (HOSPITAL_COMMUNITY): Payer: Self-pay | Admitting: Hematology

## 2024-04-03 ENCOUNTER — Ambulatory Visit
Admission: EM | Admit: 2024-04-03 | Discharge: 2024-04-03 | Disposition: A | Discharge status: Home / Self Care | Attending: Family Medicine | Admitting: Family Medicine

## 2024-04-03 ENCOUNTER — Ambulatory Visit (HOSPITAL_COMMUNITY): Payer: Medicaid Other

## 2024-04-03 ENCOUNTER — Inpatient Hospital Stay: Payer: Medicaid Other | Attending: Hematology

## 2024-04-03 ENCOUNTER — Ambulatory Visit: Payer: Self-pay

## 2024-04-03 ENCOUNTER — Encounter (HOSPITAL_COMMUNITY): Payer: Self-pay

## 2024-04-03 ENCOUNTER — Ambulatory Visit (HOSPITAL_COMMUNITY)
Admission: RE | Admit: 2024-04-03 | Discharge: 2024-04-03 | Disposition: A | Discharge status: Home / Self Care | Payer: Self-pay | Source: Ambulatory Visit | Attending: Hematology | Admitting: Hematology

## 2024-04-03 DIAGNOSIS — Z170 Estrogen receptor positive status [ER+]: Secondary | ICD-10-CM | POA: Insufficient documentation

## 2024-04-03 DIAGNOSIS — M654 Radial styloid tenosynovitis [de Quervain]: Secondary | ICD-10-CM

## 2024-04-03 DIAGNOSIS — Z79811 Long term (current) use of aromatase inhibitors: Secondary | ICD-10-CM | POA: Insufficient documentation

## 2024-04-03 DIAGNOSIS — Z1721 Progesterone receptor positive status: Secondary | ICD-10-CM | POA: Diagnosis not present

## 2024-04-03 DIAGNOSIS — Z853 Personal history of malignant neoplasm of breast: Secondary | ICD-10-CM | POA: Diagnosis not present

## 2024-04-03 DIAGNOSIS — C50912 Malignant neoplasm of unspecified site of left female breast: Secondary | ICD-10-CM

## 2024-04-03 DIAGNOSIS — Z1732 Human epidermal growth factor receptor 2 negative status: Secondary | ICD-10-CM | POA: Diagnosis not present

## 2024-04-03 DIAGNOSIS — Z1231 Encounter for screening mammogram for malignant neoplasm of breast: Secondary | ICD-10-CM | POA: Diagnosis not present

## 2024-04-03 LAB — COMPREHENSIVE METABOLIC PANEL WITH GFR
ALT: 25 U/L (ref 0–44)
AST: 31 U/L (ref 15–41)
Albumin: 3.9 g/dL (ref 3.5–5.0)
Alkaline Phosphatase: 97 U/L (ref 38–126)
Anion gap: 10 (ref 5–15)
BUN: 18 mg/dL (ref 6–20)
CO2: 25 mmol/L (ref 22–32)
Calcium: 9.6 mg/dL (ref 8.9–10.3)
Chloride: 101 mmol/L (ref 98–111)
Creatinine, Ser: 0.93 mg/dL (ref 0.44–1.00)
GFR, Estimated: >60 mL/min (ref >60)
Glucose, Bld: 175 mg/dL — ABNORMAL HIGH (ref 70–99)
Potassium: 3.9 mmol/L (ref 3.5–5.1)
Sodium: 136 mmol/L (ref 135–145)
Total Bilirubin: 0.7 mg/dL (ref 0.0–1.2)
Total Protein: 7.2 g/dL (ref 6.5–8.1)

## 2024-04-03 LAB — CBC WITH DIFFERENTIAL/PLATELET
Abs Immature Granulocytes: 0.01 10*3/uL (ref 0.00–0.07)
Basophils Absolute: 0 10*3/uL (ref 0.0–0.1)
Basophils Relative: 1 %
Eosinophils Absolute: 0.1 10*3/uL (ref 0.0–0.5)
Eosinophils Relative: 4 %
HCT: 41.6 % (ref 36.0–46.0)
Hemoglobin: 13.5 g/dL (ref 12.0–15.0)
Immature Granulocytes: 0 %
Lymphocytes Relative: 32 %
Lymphs Abs: 1.1 10*3/uL (ref 0.7–4.0)
MCH: 29.6 pg (ref 26.0–34.0)
MCHC: 32.5 g/dL (ref 30.0–36.0)
MCV: 91.2 fL (ref 80.0–100.0)
Monocytes Absolute: 0.3 10*3/uL (ref 0.1–1.0)
Monocytes Relative: 8 %
Neutro Abs: 1.9 10*3/uL (ref 1.7–7.7)
Neutrophils Relative %: 55 %
Platelets: 269 10*3/uL (ref 150–400)
RBC: 4.56 MIL/uL (ref 3.87–5.11)
RDW: 13.2 % (ref 11.5–15.5)
WBC: 3.5 10*3/uL — ABNORMAL LOW (ref 4.0–10.5)
nRBC: 0 % (ref 0.0–0.2)

## 2024-04-03 LAB — VITAMIN D 25 HYDROXY (VIT D DEFICIENCY, FRACTURES): Vit D, 25-Hydroxy: 41.72 ng/mL (ref 30–100)

## 2024-04-03 MED ORDER — NAPROXEN 500 MG PO TABS: 500.0000 mg | ORAL_TABLET | Freq: Two times a day (BID) | ORAL | 0 refills | Status: AC | PRN

## 2024-04-03 NOTE — ED Triage Notes (Addendum)
 Pt reports left thumb numbness with a knot, tingling and pain present x 1 mo

## 2024-04-03 NOTE — Telephone Encounter (Signed)
 FYI Only or Action Required?: FYI only for provider.  Patient was last seen in primary care on 01/24/2024 by [name], Lucio Sabin, MD. Called Nurse Triage reporting Finger Injury. Symptoms began several weeks ago. Interventions attempted: Nothing. Symptoms are: unchanged.  Triage Disposition: See PCP When Office is Open (Within 3 Days)  Patient/caregiver understands and will follow disposition?: No  Copied from CRM [id number]. Topic: Clinical - Red Word Triage >> Apr 03, 2024 12:22 PM Doris Newman wrote: Red Word that prompted transfer to Nurse Triage: patient calling in, left thumb numb at the bottom where it meets the palm, when moving thumb it hurts and tingles Reason for Disposition  [1] After 1 week AND [2] not using the finger normally  Answer Assessment - Initial Assessment Questions 1. MECHANISM: How did the injury happen?      Denies injury 2. ONSET: When did the injury happen? (Minutes or hours ago)      3 weeks 3. LOCATION: What part of the finger is injured? Is the nail damaged?      L thumb 4. APPEARANCE of the INJURY: What does the injury look like?      Small knot on the thumb 5. SEVERITY: Can you use the hand normally?  Can you bend your fingers into a ball and then fully open them?     Certain way I move it hurts 6. SIZE: For cuts, bruises, or swelling, ask: How large is it? (e.g., inches or centimeters;  entire finger)      denies 7. PAIN: Is there pain? If Yes, ask: How bad is the pain?    (e.g., Scale 1-10; or mild, moderate, severe)  - NONE (0): no pain.  - MILD (1-3): doesn't interfere with normal activities.   - MODERATE (4-7): interferes with normal activities or awakens from sleep.  - SEVERE (8-10): excruciating pain, unable to hold a glass of water or bend finger even a little.     Severe when it does hurt 9. OTHER SYMPTOMS: Do you have any other symptoms? denies  Protocols used: Finger Injury-A-AH Refused appt, will be utilizing UC .

## 2024-04-03 NOTE — ED Provider Notes (Signed)
 [facility]    CSN: [id number] Arrival date & time: 04/03/24  1301      History   Chief Complaint No chief complaint on file.   HPI Doris Newman is a 53 y.o. female.   Patient presenting today with about a month of left base of thumb numbness, tingling, swelling at times.  States pain is worse when she is moving her wrist in certain directions or doing certain activities with her hand.  Denies discoloration, rashes, known injury to the area but did recently have a very physical job with lots of lifting.  So far not trying a dose of Tylenol  today with minimal relief.    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  - Lobular neoplasia (atypical lobular hyperplasia).  - Invasive carcinoma is broadly less than 0.1 cm to the medial margin of  specimen A.    OB History   No obstetric history on file.      Home Medications    Prior to Admission medications   Medication Sig Start Date End Date Taking? Authorizing Provider  naproxen (NAPROSYN) 500 MG tablet  Take 1 tablet (500 mg total) by mouth 2 (two) times daily as needed. 04/03/24  Yes Corbin Dess, PA-C  ALPRAZolam (XANAX) 1 MG tablet Take 1 mg by mouth 4 (four) times daily.    [provider]  anastrozole  (ARIMIDEX ) 1 MG tablet TAKE ONE TABLET DAILY 08/13/23   Paulett Boros, MD  benzonatate (TESSALON) 200 MG capsule Take 200 mg by mouth 3 (three) times daily as needed. 10/26/23   [provider]  enalapril  (VASOTEC ) 20 MG tablet Take 1 tablet (20 mg total) by mouth daily. 01/24/24   [name], Lucio Sabin, MD  Fezolinetant  (VEOZAH ) 45 MG TABS Take 1 tablet (45 mg total) by mouth daily. 04/01/24   Paulett Boros, MD  fluticasone  (FLONASE ) 50 MCG/ACT nasal spray Place 1 spray into both nostrils 2 (two) times daily as needed for allergies or rhinitis. 01/05/23   [name], Lucio Sabin, MD  furosemide  (LASIX ) 40 MG tablet Take 1 tablet (40 mg total) by mouth daily. 01/24/24   [name], Lucio Sabin, MD  gabapentin  (NEURONTIN ) 300 MG capsule Take 2 capsules (600 mg total) by mouth at bedtime. 09/06/22   Paulett Boros, MD  meloxicam  (MOBIC ) 7.5 MG tablet Take 1 tablet (7.5 mg total) by mouth daily. 01/24/24   [name], Lucio Sabin, MD  mirtazapine (REMERON) 15 MG tablet Take 15 mg by mouth at bedtime as needed (sleep).  10/29/19   [provider]  omeprazole  (PRILOSEC) 20 MG capsule Take 1 capsule (20 mg total) by mouth daily. 01/24/24   [name], Lucio Sabin, MD  ondansetron  (ZOFRAN -ODT) 4 MG disintegrating tablet Take 1 tablet (4 mg total) by mouth 3 (three) times daily as needed. 01/09/24   Roise Cleaver, MD  propranolol (INDERAL) 20 MG tablet Take 20 mg by mouth daily.     [provider]  rizatriptan  (MAXALT ) 5 MG tablet Take 1 tablet (5 mg total) by mouth as needed for migraine. May repeat in 2 hours if needed 01/24/24   [name], Lucio Sabin, MD  rosuvastatin  (CRESTOR ) 5 MG tablet Take 1 tablet (5 mg total) by mouth daily. 01/24/24   [name], Lucio Sabin, MD   tizanidine  (ZANAFLEX ) 2 MG capsule Take 1 capsule (2 mg total) by mouth at bedtime as needed for muscle spasms. 01/24/24   [name], Lucio Sabin, MD  venlafaxine  XR (EFFEXOR -XR) 37.5 MG 24 hr capsule Take 1 capsule (37.5 mg total) by mouth daily with breakfast. Take 1 capsule for 1 week, then take 2 capsules daily. 08/24/20   Paulett Boros, MD    Family History Family History  Problem Relation Age of Onset   Prostate cancer Father        dx. in his late 50s/early 81s   Breast cancer Paternal Grandmother        dx. in her 10s   Fibromyalgia Sister    Leukemia Paternal Grandfather        dx. in his 52s    Social History Social History   Tobacco Use   Smoking status: Some Days    Current packs/day: 0.50    Average packs/day: 0.5 packs/day for 25.0 years (12.5 ttl pk-yrs)    Types: Cigarettes   Smokeless tobacco: Never  Vaping Use   Vaping status: Never Used  Substance Use Topics   Alcohol use: Yes    Comment: occasional   Drug use: No     Allergies   Patient has no known allergies.   Review of Systems Review of Systems Per HPI  Physical Exam Triage Vital Signs ED Triage Vitals  Encounter Vitals Group     BP 04/03/24 1310 (!) 96/57     Girls Systolic BP Percentile --      Girls Diastolic BP Percentile --      Boys Systolic BP Percentile --      Boys Diastolic BP Percentile --      Pulse Rate 04/03/24 1310 98     Resp 04/03/24 1310 20     Temp 04/03/24 1310 98.4 F (36.9 C)     Temp Source 04/03/24 1310 Oral     SpO2 04/03/24 1310 96 %     Weight --      Height --      Head Circumference --      Peak Flow --      Pain Score 04/03/24 1312 0     Pain Loc --      Pain Education --      Exclude from Growth Chart --    No data found.  Updated Vital Signs BP (!) 96/57 (BP Location: Right Arm)   Pulse 98   Temp 98.4 F (36.9 C) (Oral)   Resp 20   LMP 02/29/2020   SpO2 96%   Visual Acuity Right Eye Distance:   Left Eye Distance:   Bilateral  Distance:    Right Eye Near:   Left Eye Near:  Bilateral Near:     Physical Exam Vitals and nursing note reviewed.  Constitutional:      Appearance: Normal appearance. She is not ill-appearing.  HENT:     Head: Atraumatic.   Eyes:     Extraocular Movements: Extraocular movements intact.     Conjunctiva/sclera: Conjunctivae normal.    Cardiovascular:     Rate and Rhythm: Normal rate and regular rhythm.  Pulmonary:     Effort: Pulmonary effort is normal.   Musculoskeletal:        General: Tenderness present. No swelling. Normal range of motion.     Cervical back: Normal range of motion and neck supple.     Comments: Positive Finkelstein test to the left wrist, tenderness to palpation to the base of the left thumb with no bony deformity palpable.  Range of motion and grip strength intact to the left hand and wrist   Skin:    General: Skin is warm and dry.     Findings: No bruising or erythema.   Neurological:     Mental Status: She is alert and oriented to person, place, and time.     Comments: Left upper extremity neurovascularly intact  Psychiatric:        Mood and Affect: Mood normal.        Thought Content: Thought content normal.        Judgment: Judgment normal.      UC Treatments / Results  Labs (all labs ordered are listed, but only abnormal results are displayed) Labs Reviewed - No data to display  EKG   Radiology No results found.  Procedures Procedures (including critical care time)  Medications Ordered in UC Medications - No data to display  Initial Impression / Assessment and Plan / UC Course  I have reviewed the triage vital signs and the nursing notes.  Pertinent labs & imaging results that were available during my care of the patient were reviewed by me and considered in my medical decision making (see chart for details).     Suspected de Quervain's tenosynovitis.  Thumb spica splint placed, treat with naproxen, Zanaflex  which she  states she has some at home, heat, massage, stretches and avoidance of lifting with the wrist.  Return for worsening symptoms.  X-ray imaging deferred with shared decision making today. Final Clinical Impressions(s) / UC Diagnoses   Final diagnoses:  De Quervain's tenosynovitis   Discharge Instructions   None    ED Prescriptions     Medication Sig Dispense Auth. Provider   naproxen (NAPROSYN) 500 MG tablet Take 1 tablet (500 mg total) by mouth 2 (two) times daily as needed. 30 tablet Corbin Dess, [place]      PDMP not reviewed this encounter.   Corbin Dess, [place] 04/03/24 1513

## 2024-04-17 ENCOUNTER — Inpatient Hospital Stay: Payer: Medicaid Other | Attending: Hematology | Admitting: Hematology

## 2024-04-17 VITALS — BP 87/69 | HR 74 | Temp 98.5°F | Resp 18 | Wt 172.2 lb

## 2024-04-17 DIAGNOSIS — C50912 Malignant neoplasm of unspecified site of left female breast: Secondary | ICD-10-CM | POA: Diagnosis not present

## 2024-04-17 DIAGNOSIS — Z1721 Progesterone receptor positive status: Secondary | ICD-10-CM | POA: Diagnosis not present

## 2024-04-17 DIAGNOSIS — Z806 Family history of leukemia: Secondary | ICD-10-CM | POA: Insufficient documentation

## 2024-04-17 DIAGNOSIS — Z8042 Family history of malignant neoplasm of prostate: Secondary | ICD-10-CM | POA: Diagnosis not present

## 2024-04-17 DIAGNOSIS — Z79811 Long term (current) use of aromatase inhibitors: Secondary | ICD-10-CM | POA: Insufficient documentation

## 2024-04-17 DIAGNOSIS — F17210 Nicotine dependence, cigarettes, uncomplicated: Secondary | ICD-10-CM | POA: Diagnosis not present

## 2024-04-17 DIAGNOSIS — Z803 Family history of malignant neoplasm of breast: Secondary | ICD-10-CM | POA: Insufficient documentation

## 2024-04-17 DIAGNOSIS — Z9012 Acquired absence of left breast and nipple: Secondary | ICD-10-CM | POA: Insufficient documentation

## 2024-04-17 DIAGNOSIS — R232 Flushing: Secondary | ICD-10-CM | POA: Diagnosis not present

## 2024-04-17 DIAGNOSIS — Z1732 Human epidermal growth factor receptor 2 negative status: Secondary | ICD-10-CM | POA: Insufficient documentation

## 2024-04-17 DIAGNOSIS — Z170 Estrogen receptor positive status [ER+]: Secondary | ICD-10-CM | POA: Insufficient documentation

## 2024-04-17 DIAGNOSIS — Z79899 Other long term (current) drug therapy: Secondary | ICD-10-CM | POA: Diagnosis not present

## 2024-04-17 NOTE — Patient Instructions (Addendum)
 [facility] at [facility] Discharge Instructions   You were seen and examined today by Dr. Rogers.  He reviewed the results of your lab work which are normal/stable.   Your mammogram did not show any evidence of cancer.   We will see you back in 6 monthsd.   Return as scheduled.    Thank you for choosing [facility] at [facility] to provide your oncology and hematology care.  To afford each patient quality time with our provider, please arrive at least 15 minutes before your scheduled appointment time.   If you have a lab appointment with the Cancer Center please come in thru the Main Entrance and check in at the main information desk.  You need to re-schedule your appointment should you arrive 10 or more minutes late.  We strive to give you quality time with our providers, and arriving late affects you and other patients whose appointments are after yours.  Also, if you no show three or more times for appointments you may be dismissed from the clinic at the providers discretion.     Again, thank you for choosing [facility].  Our hope is that these requests will decrease the amount of time that you wait before being seen by our physicians.       _____________________________________________________________  Should you have questions after your visit to [facility], please contact our office at [phone] and follow the prompts.  Our office hours are 8:00 a.m. and 4:30 p.m. Monday - Friday.  Please note that voicemails left after 4:00 p.m. may not be returned until the following business day.  We are closed weekends and major holidays.  You do have access to a nurse 24-7, just call the main number to the clinic [phone] and do not press any options, hold on the line and a nurse will answer the phone.    For prescription refill requests, have your pharmacy contact our office and allow 72 hours.    Due to  Covid, you will need to wear a mask upon entering the hospital. If you do not have a mask, a mask will be given to you at the Main Entrance upon arrival. For doctor visits, patients may have 1 support person age 23 or older with them. For treatment visits, patients can not have anyone with them due to social distancing guidelines and our immunocompromised population.

## 2024-04-17 NOTE — Progress Notes (Signed)
 [facility] [address] [place] [address]    Clinic Day:  04/17/2024  Referring physician: [name], Fonda LABOR, MD  Patient Care Team: [name], Fonda LABOR, MD as PCP - General (Family Medicine) Wilson, Diane G, RN as Oncology Nurse Navigator (Oncology) Rogers Hai, MD as Medical Oncologist (Oncology)   ASSESSMENT & PLAN:   Assessment: 1.  Stage Ib (T2N0) left breast IDC: -Right breast lump was palpated at health department.  Bilateral mammogram and ultrasound on 02/04/2020 at [facility] showed 1.8 x 1.3 x 2.1 cm irregular hypoechoic mass at the 2 o'clock position 3 to 4 cm from nipple. -Ultrasound-guided biopsy on 02/04/2020 showed invasive ductal carcinoma, grade 2. -Left lumpectomy after needle localization and SLNB by Dr. Kallie on 03/08/2020. -Pathology-2.1 cm IDC, grade 1, 0/3 lymph nodes positive, PT 2 PN 0, ER 95%, PR to 70%, HER-2 negative, Ki-67 10%.  Invasive carcinoma is broadly less than 0.1 cm to the medial margin.  Reexcision margins were negative. -Oncotype DX recurrence score 16, distant recurrence risk at 9 years with tamoxifen/AI of 4%, absolute chemotherapy benefit less than 1%. -XRT to the left breast started on 05/04/2020 and finished in August 2021. -Lupron  started on 05/31/2020 along with anastrozole .   2.  Family history: -Paternal grandmother had breast cancer. -Father had prostate cancer.    Plan: 1.  Stage I left breast IDC: - She is tolerating Lupron  and anastrozole  very well. - I reviewed mammogram from 04/03/2024: BI-RADS Category 2. - Labs from 04/03/2024: LFTs are normal.  CBC grossly normal. - Physical exam: Breast exam not performed today.  No palpable adenopathy in the axilla or supraclavicular region. - She will continue Lupron  with anastrozole  and will complete 5 years in August 2026.  I have ordered a BCI testing to see if she benefits from extended antiestrogen therapy. - Continue Lupron  every 3 months.  She will  follow-up in 6 months with repeat labs.   2.  Hot flashes: - Continue Veozah  45 mg which is helping.  Continue gabapentin  at bedtime which is also helping with hot flashes.   3.  Bone health (DEXA 02/28/2022 T-score -0.7): - Vitamin D  is normal at 41.  Continue calcium  and vitamin D  supplements. - Will repeat DEXA scan prior to next visit.    Orders Placed This Encounter  Procedures   DG Bone Density    Standing Status:   Future    Expiration Date:   04/17/2025    Reason for Exam (SYMPTOM  OR DIAGNOSIS REQUIRED):   antiestrogen therapy    Is patient pregnant?:   No    Preferred imaging location?:   [facility] R Teague,acting as a scribe for Hai Rogers, MD.,have documented all relevant documentation on the behalf of Hai Rogers, MD,as directed by  Hai Rogers, MD while in the presence of Hai Rogers, MD.  I, Hai Rogers MD, have reviewed the above documentation for accuracy and completeness, and I agree with the above.     Hai Rogers, MD   7/3/20253:41 PM  CHIEF COMPLAINT:   Diagnosis: left breast invasive ductal carcinoma    Cancer Staging  Invasive ductal carcinoma of left breast [facility]) Staging form: Breast, AJCC 8th Edition - Clinical stage from 03/25/2020: Stage IB (cT2, cN0(sn), cM0, G1, ER+, PR+, HER2-) - Unsigned    Prior Therapy: 1. Left lumpectomy and SLNB, 03/08/20 2. XRT to left breast 05/04/20 - 05/2020  Current Therapy:  Lupron  +  anastrozole    HISTORY OF PRESENT ILLNESS:   Oncology History  Invasive ductal carcinoma of left breast (HCC)  02/19/2020 Initial Diagnosis   Invasive ductal carcinoma of left breast (HCC)   04/06/2020 Genetic Testing   Oncotype DX Breast Recurrence Score Report         INTERVAL HISTORY:   Shacara is a 53 y.o. female presenting to clinic today for follow up of left breast invasive ductal carcinoma. She was last seen by me on 10/11/23.  Since her last  visit, she underwent bilateral screening mammogram on 04/03/24 that found: No mammographic evidence of malignancy.   Today, she states that she is doing well overall. Her appetite level is at 65%. Her energy level is at 80%. Rashawnda is tolerating Lupron  and anastrozole  well. She denies any side effects. Hot flashes are well controlled with Veozah .   Javonda states at this visit and her last one, she had low blood pressure which has not been an issue. She has lost 9 pounds since 01/09/24. She is taking Calcium  and Vitamin D  as prescribed.  PAST MEDICAL HISTORY:   Past Medical History: Past Medical History:  Diagnosis Date   Anxiety    Family history of adverse reaction to anesthesia    PONV   Family history of breast cancer    Family history of leukemia    Family history of prostate cancer    GERD (gastroesophageal reflux disease)    Hypertension    Personal history of radiation therapy     Surgical History: Past Surgical History:  Procedure Laterality Date   BREAST LUMPECTOMY Left 03/08/2020   LEEP     PARTIAL MASTECTOMY WITH NEEDLE LOCALIZATION AND AXILLARY SENTINEL LYMPH NODE BX Left 03/08/2020   Ductal carcinoma in situ with calcifications, intermediate grade.  - Lobular neoplasia (atypical lobular hyperplasia).  - Invasive carcinoma is broadly less than 0.1 cm to the medial margin of  specimen A.    Social History: Social History   Socioeconomic History   Marital status: Widowed    Spouse name: Not on file   Number of children: 1   Years of education: Not on file   Highest education level: GED or equivalent  Occupational History   Not on file  Tobacco Use   Smoking status: Some Days    Current packs/day: 0.50    Average packs/day: 0.5 packs/day for 25.0 years (12.5 ttl pk-yrs)    Types: Cigarettes   Smokeless tobacco: Never  Vaping Use   Vaping status: Never Used  Substance and Sexual Activity   Alcohol use: Yes    Comment: occasional   Drug use: No   Sexual  activity: Not Currently  Other Topics Concern   Not on file  Social History Narrative   Not on file   Social Drivers of Health   Financial Resource Strain: Low Risk  (03/25/2020)   Overall Financial Resource Strain (CARDIA)    Difficulty of Paying Living Expenses: Not very hard  Food Insecurity: No Food Insecurity (03/25/2020)   Hunger Vital Sign    Worried About Running Out of Food in the Last Year: Never true    Ran Out of Food in the Last Year: Never true  Transportation Needs: No Transportation Needs (02/15/2021)   PRAPARE - Administrator, Civil Service (Medical): No    Lack of Transportation (Non-Medical): No  Physical Activity: Insufficiently Active (03/25/2020)   Exercise Vital Sign    Days of Exercise per Week: 2 days  Minutes of Exercise per Session: 20 min  Stress: Stress Concern Present (03/25/2020)   [employer] of Occupational Health - Occupational Stress Questionnaire    Feeling of Stress : To some extent  Social Connections: Moderately Integrated (03/25/2020)   Social Connection and Isolation Panel    Frequency of Communication with Friends and Family: More than three times a week    Frequency of Social Gatherings with Friends and Family: More than three times a week    Attends Religious Services: More than 4 times per year    Active Member of [employer] or Organizations: No    Attends Engineer, structural: More than 4 times per year    Marital Status: Widowed  Intimate Partner Violence: Not At Risk (03/25/2020)   Humiliation, Afraid, Rape, and Kick questionnaire    Fear of Current or Ex-Partner: No    Emotionally Abused: No    Physically Abused: No    Sexually Abused: No    Family History: Family History  Problem Relation Age of Onset   Prostate cancer Father        dx. in his late 50s/early 63s   Breast cancer Paternal Grandmother        dx. in her 44s   Fibromyalgia Sister    Leukemia Paternal Grandfather        dx. in his 30s     Current Medications:  Current Outpatient Medications:    ALPRAZolam (XANAX) 1 MG tablet, Take 1 mg by mouth 4 (four) times daily., Disp: , Rfl:    anastrozole  (ARIMIDEX ) 1 MG tablet, TAKE ONE TABLET DAILY, Disp: 90 tablet, Rfl: 2   benzonatate (TESSALON) 200 MG capsule, Take 200 mg by mouth 3 (three) times daily as needed., Disp: , Rfl:    enalapril  (VASOTEC ) 20 MG tablet, Take 1 tablet (20 mg total) by mouth daily., Disp: 90 tablet, Rfl: 3   Fezolinetant  (VEOZAH ) 45 MG TABS, Take 1 tablet (45 mg total) by mouth daily., Disp: 90 tablet, Rfl: 3   fluticasone  (FLONASE ) 50 MCG/ACT nasal spray, Place 1 spray into both nostrils 2 (two) times daily as needed for allergies or rhinitis., Disp: 48 g, Rfl: 1   furosemide  (LASIX ) 40 MG tablet, Take 1 tablet (40 mg total) by mouth daily., Disp: 90 tablet, Rfl: 3   gabapentin  (NEURONTIN ) 300 MG capsule, Take 2 capsules (600 mg total) by mouth at bedtime., Disp: 60 capsule, Rfl: 6   meloxicam  (MOBIC ) 7.5 MG tablet, Take 1 tablet (7.5 mg total) by mouth daily., Disp: 90 tablet, Rfl: 3   mirtazapine (REMERON) 15 MG tablet, Take 15 mg by mouth at bedtime as needed (sleep). , Disp: , Rfl:    naproxen  (NAPROSYN ) 500 MG tablet, Take 1 tablet (500 mg total) by mouth 2 (two) times daily as needed., Disp: 30 tablet, Rfl: 0   omeprazole  (PRILOSEC) 20 MG capsule, Take 1 capsule (20 mg total) by mouth daily., Disp: 90 capsule, Rfl: 3   ondansetron  (ZOFRAN -ODT) 4 MG disintegrating tablet, Take 1 tablet (4 mg total) by mouth 3 (three) times daily as needed., Disp: 20 tablet, Rfl: 0   propranolol (INDERAL) 20 MG tablet, Take 20 mg by mouth daily. , Disp: , Rfl:    rizatriptan  (MAXALT ) 5 MG tablet, Take 1 tablet (5 mg total) by mouth as needed for migraine. May repeat in 2 hours if needed, Disp: 10 tablet, Rfl: 5   rosuvastatin  (CRESTOR ) 5 MG tablet, Take 1 tablet (5 mg total) by mouth daily., Disp:  90 tablet, Rfl: 3   tizanidine  (ZANAFLEX ) 2 MG capsule, Take 1 capsule  (2 mg total) by mouth at bedtime as needed for muscle spasms., Disp: 30 capsule, Rfl: 2   venlafaxine  XR (EFFEXOR -XR) 37.5 MG 24 hr capsule, Take 1 capsule (37.5 mg total) by mouth daily with breakfast. Take 1 capsule for 1 week, then take 2 capsules daily., Disp: 60 capsule, Rfl: 3 No current facility-administered medications for this visit.  Facility-Administered Medications Ordered in Other Visits:    leuprolide  (LUPRON ) injection 11.25 mg, 11.25 mg, Intramuscular, Once, Mancel Lardizabal, MD   Allergies: No Known Allergies  REVIEW OF SYSTEMS:   Review of Systems  Constitutional:  Negative for chills, fatigue and fever.  HENT:   Negative for lump/mass, mouth sores, nosebleeds, sore throat and trouble swallowing.   Eyes:  Negative for eye problems.  Respiratory:  Negative for cough and shortness of breath.   Cardiovascular:  Negative for chest pain, leg swelling and palpitations.  Gastrointestinal:  Negative for abdominal pain, constipation, diarrhea, nausea and vomiting.  Genitourinary:  Negative for bladder incontinence, difficulty urinating, dysuria, frequency, hematuria and nocturia.   Musculoskeletal:  Negative for arthralgias, back pain, flank pain, myalgias and neck pain.  Skin:  Negative for itching and rash.  Neurological:  Positive for numbness (and pain in fingers, 6/10 pain severity). Negative for dizziness and headaches.  Hematological:  Does not bruise/bleed easily.  Psychiatric/Behavioral:  Positive for sleep disturbance. Negative for depression and suicidal ideas. The patient is not nervous/anxious.   All other systems reviewed and are negative.    VITALS:   Blood pressure (!) 87/69, pulse 74, temperature 98.5 F (36.9 C), temperature source Oral, resp. rate 18, weight 172 lb 3.2 oz (78.1 kg), last menstrual period 02/29/2020, SpO2 98%.  Wt Readings from Last 3 Encounters:  04/17/24 172 lb 3.2 oz (78.1 kg)  01/24/24 180 lb (81.6 kg)  01/09/24 181 lb (82.1 kg)     Body mass index is 27.79 kg/m.  Performance status (ECOG): 1 - Symptomatic but completely ambulatory  PHYSICAL EXAM:   Physical Exam Vitals and nursing note reviewed. Exam conducted with a chaperone present.  Constitutional:      Appearance: Normal appearance.  Cardiovascular:     Rate and Rhythm: Normal rate and regular rhythm.     Pulses: Normal pulses.     Heart sounds: Normal heart sounds.  Pulmonary:     Effort: Pulmonary effort is normal.     Breath sounds: Normal breath sounds.  Abdominal:     Palpations: Abdomen is soft. There is no hepatomegaly, splenomegaly or mass.     Tenderness: There is no abdominal tenderness.  Musculoskeletal:     Right lower leg: No edema.     Left lower leg: No edema.  Lymphadenopathy:     Cervical: No cervical adenopathy.     Right cervical: No superficial, deep or posterior cervical adenopathy.    Left cervical: No superficial, deep or posterior cervical adenopathy.     Upper Body:     Right upper body: No supraclavicular or axillary adenopathy.     Left upper body: No supraclavicular or axillary adenopathy.  Neurological:     General: No focal deficit present.     Mental Status: She is alert and oriented to person, place, and time.  Psychiatric:        Mood and Affect: Mood normal.        Behavior: Behavior normal.     LABS:  Latest Ref Rng & Units 04/03/2024   11:06 AM 01/24/2024    1:36 PM 09/27/2023   11:05 AM  CBC  WBC 4.0 - 10.5 K/uL 3.5  7.2  4.5   Hemoglobin 12.0 - 15.0 g/dL 86.4  86.9  86.7   Hematocrit 36.0 - 46.0 % 41.6  39.6  39.4   Platelets 150 - 400 K/uL 269  286  291       Latest Ref Rng & Units 04/03/2024   11:06 AM 01/24/2024    1:36 PM 09/27/2023   11:05 AM  CMP  Glucose 70 - 99 mg/dL 824  88  94   BUN 6 - 20 mg/dL 18  15  13    Creatinine 0.44 - 1.00 mg/dL 9.06  9.15  9.05   Sodium 135 - 145 mmol/L 136  140  138   Potassium 3.5 - 5.1 mmol/L 3.9  3.7  3.7   Chloride 98 - 111 mmol/L 101  98  100    CO2 22 - 32 mmol/L 25  26  27    Calcium  8.9 - 10.3 mg/dL 9.6  9.7  89.9   Total Protein 6.5 - 8.1 g/dL 7.2  6.6  7.7   Total Bilirubin 0.0 - 1.2 mg/dL 0.7  0.4  0.5   Alkaline Phos 38 - 126 U/L 97  91  95   AST 15 - 41 U/L 31  22  26    ALT 0 - 44 U/L 25  31  27       No results found for: CEA1, CEA / No results found for: CEA1, CEA No results found for: PSA1 No results found for: CAN199 No results found for: CAN125  No results found for: TOTALPROTELP, ALBUMINELP, A1GS, A2GS, BETS, BETA2SER, GAMS, MSPIKE, SPEI Lab Results  Component Value Date   FERRITIN 48 12/06/2015   No results found for: LDH   STUDIES:   MM 3D SCREENING MAMMOGRAM BILATERAL BREAST Result Date: 04/07/2024 CLINICAL DATA:  Screening. Personal history of malignant LEFT breast lumpectomy in 2021. EXAM: DIGITAL SCREENING BILATERAL MAMMOGRAM WITH TOMOSYNTHESIS AND CAD TECHNIQUE: Bilateral screening digital craniocaudal and mediolateral oblique mammograms were obtained. Bilateral screening digital breast tomosynthesis was performed. The images were evaluated with computer-aided detection. COMPARISON:  Previous exam(s). ACR Breast Density Category b: There are scattered areas of fibroglandular density. FINDINGS: There are no findings suspicious for malignancy. Expected post lumpectomy changes involving the LEFT breast. IMPRESSION: No mammographic evidence of malignancy. A result letter of this screening mammogram will be mailed directly to the patient. RECOMMENDATION: Screening mammogram in one year. (Code:SM-B-01Y) BI-RADS CATEGORY  2: Benign. Electronically Signed   By: Debby Satterfield M.D.   On: 04/07/2024 08:15

## 2024-05-22 ENCOUNTER — Encounter (HOSPITAL_COMMUNITY): Payer: Self-pay | Admitting: Hematology

## 2024-05-22 ENCOUNTER — Inpatient Hospital Stay: Attending: Hematology

## 2024-05-22 VITALS — BP 149/92 | HR 71 | Temp 97.9°F | Resp 18

## 2024-05-22 DIAGNOSIS — C50912 Malignant neoplasm of unspecified site of left female breast: Secondary | ICD-10-CM | POA: Insufficient documentation

## 2024-05-22 DIAGNOSIS — Z170 Estrogen receptor positive status [ER+]: Secondary | ICD-10-CM | POA: Diagnosis not present

## 2024-05-22 DIAGNOSIS — Z79818 Long term (current) use of other agents affecting estrogen receptors and estrogen levels: Secondary | ICD-10-CM | POA: Diagnosis not present

## 2024-05-22 MED ORDER — LEUPROLIDE ACETATE (3 MONTH) 11.25 MG IM KIT
11.2500 mg | PACK | Freq: Once | INTRAMUSCULAR | Status: AC
Start: 2024-05-22 — End: 2024-05-22
  Administered 2024-05-22: 11.25 mg via INTRAMUSCULAR
  Filled 2024-05-22: qty 11.25

## 2024-05-22 NOTE — Progress Notes (Signed)
 Patient tolerated Lupron  injection with no complaints voiced.  Site clean and dry with no bruising or swelling noted at site.  See MAR for details.  Band aid applied.  Patient stable during and after injection.  Vss with discharge and left in satisfactory condition with no s/s of distress noted. All follow ups as scheduled.   Doris Newman

## 2024-05-22 NOTE — Patient Instructions (Signed)
 [facility] - A DEPT OF [facility]  Discharge Instructions: Thank you for choosing [facility] to provide your oncology and hematology care.  If you have a lab appointment with the Cancer Center - please note that after April 8th, 2024, all labs will be drawn in the cancer center.  You do not have to check in or register with the main entrance as you have in the past but will complete your check-in in the cancer center.  Wear comfortable clothing and clothing appropriate for easy access to any Portacath or PICC line.   We strive to give you quality time with your provider. You may need to reschedule your appointment if you arrive late (15 or more minutes).  Arriving late affects you and other patients whose appointments are after yours.  Also, if you miss three or more appointments without notifying the office, you may be dismissed from the clinic at the provider's discretion.      For prescription refill requests, have your pharmacy contact our office and allow 72 hours for refills to be completed.    Today you received the following chemotherapy and/or immunotherapy agents Lupron      To help prevent nausea and vomiting after your treatment, we encourage you to take your nausea medication as directed.  BELOW ARE SYMPTOMS THAT SHOULD BE REPORTED IMMEDIATELY: *FEVER GREATER THAN 100.4 F (38 C) OR HIGHER *CHILLS OR SWEATING *NAUSEA AND VOMITING THAT IS NOT CONTROLLED WITH YOUR NAUSEA MEDICATION *UNUSUAL SHORTNESS OF BREATH *UNUSUAL BRUISING OR BLEEDING *URINARY PROBLEMS (pain or burning when urinating, or frequent urination) *BOWEL PROBLEMS (unusual diarrhea, constipation, pain near the anus) TENDERNESS IN MOUTH AND THROAT WITH OR WITHOUT PRESENCE OF ULCERS (sore throat, sores in mouth, or a toothache) UNUSUAL RASH, SWELLING OR PAIN  UNUSUAL VAGINAL DISCHARGE OR ITCHING   Items with * indicate a potential emergency and should be followed up as  soon as possible or go to the Emergency Department if any problems should occur.  Please show the CHEMOTHERAPY ALERT CARD or IMMUNOTHERAPY ALERT CARD at check-in to the Emergency Department and triage nurse.  Should you have questions after your visit or need to cancel or reschedule your appointment, please contact [facility] [place] - A DEPT OF [facility] [phone]  and follow the prompts.  Office hours are 8:00 a.m. to 4:30 p.m. Monday - Friday. Please note that voicemails left after 4:00 p.m. may not be returned until the following business day.  We are closed weekends and major holidays. You have access to a nurse at all times for urgent questions. Please call the main number to the clinic [phone] and follow the prompts.  For any non-urgent questions, you may also contact your provider using MyChart. We now offer e-Visits for anyone 72 and older to request care online for non-urgent symptoms. For details visit [email or web address].   Also download the MyChart app! Go to the app store, search "MyChart", open the app, select , and log in with your MyChart username and password.

## 2024-05-23 ENCOUNTER — Encounter (HOSPITAL_COMMUNITY): Payer: Self-pay

## 2024-05-23 ENCOUNTER — Inpatient Hospital Stay

## 2024-05-23 DIAGNOSIS — C50412 Malignant neoplasm of upper-outer quadrant of left female breast: Secondary | ICD-10-CM | POA: Diagnosis not present

## 2024-05-23 DIAGNOSIS — Z170 Estrogen receptor positive status [ER+]: Secondary | ICD-10-CM | POA: Diagnosis not present

## 2024-06-18 ENCOUNTER — Other Ambulatory Visit: Payer: Self-pay | Admitting: *Deleted

## 2024-06-18 MED ORDER — VEOZAH 45 MG PO TABS: 45.0000 mg | ORAL_TABLET | Freq: Every day | ORAL | 3 refills | Status: DC

## 2024-06-20 ENCOUNTER — Other Ambulatory Visit: Payer: Self-pay | Admitting: *Deleted

## 2024-06-23 ENCOUNTER — Encounter: Payer: Self-pay | Admitting: *Deleted

## 2024-06-24 ENCOUNTER — Encounter: Payer: Self-pay | Admitting: *Deleted

## 2024-06-24 NOTE — Progress Notes (Signed)
 P2P review completed by Dr. Davonna regarding Veozah  approval.  As of 06/25/24 medication has been approved by plan through 06/25/2025.  Patient and pharmacy made aware.  Reference # [id number].

## 2024-06-25 ENCOUNTER — Encounter: Payer: Self-pay | Admitting: *Deleted

## 2024-07-29 ENCOUNTER — Other Ambulatory Visit: Payer: Self-pay | Admitting: *Deleted

## 2024-07-29 DIAGNOSIS — C50912 Malignant neoplasm of unspecified site of left female breast: Secondary | ICD-10-CM

## 2024-07-29 MED ORDER — ANASTROZOLE 1 MG PO TABS: 1.0000 mg | ORAL_TABLET | Freq: Every day | ORAL | 2 refills | Status: DC

## 2024-08-04 ENCOUNTER — Encounter: Payer: Self-pay | Admitting: *Deleted

## 2024-08-05 DIAGNOSIS — F331 Major depressive disorder, recurrent, moderate: Secondary | ICD-10-CM | POA: Diagnosis not present

## 2024-08-05 DIAGNOSIS — F4321 Adjustment disorder with depressed mood: Secondary | ICD-10-CM | POA: Diagnosis not present

## 2024-08-05 DIAGNOSIS — F3342 Major depressive disorder, recurrent, in full remission: Secondary | ICD-10-CM | POA: Diagnosis not present

## 2024-08-05 DIAGNOSIS — Z5181 Encounter for therapeutic drug level monitoring: Secondary | ICD-10-CM | POA: Diagnosis not present

## 2024-08-07 ENCOUNTER — Ambulatory Visit: Admitting: Family Medicine

## 2024-08-14 ENCOUNTER — Ambulatory Visit: Admitting: Family Medicine

## 2024-08-15 ENCOUNTER — Ambulatory Visit

## 2024-08-28 ENCOUNTER — Ambulatory Visit: Payer: Self-pay | Admitting: Family Medicine

## 2024-08-28 ENCOUNTER — Other Ambulatory Visit (HOSPITAL_COMMUNITY)

## 2024-08-28 ENCOUNTER — Inpatient Hospital Stay

## 2024-08-28 ENCOUNTER — Encounter: Payer: Self-pay | Admitting: Family Medicine

## 2024-08-28 VITALS — BP 98/68 | HR 92 | Ht 66.0 in | Wt 163.0 lb

## 2024-08-28 DIAGNOSIS — E7800 Pure hypercholesterolemia, unspecified: Secondary | ICD-10-CM | POA: Diagnosis not present

## 2024-08-28 DIAGNOSIS — Z23 Encounter for immunization: Secondary | ICD-10-CM

## 2024-08-28 DIAGNOSIS — K219 Gastro-esophageal reflux disease without esophagitis: Secondary | ICD-10-CM

## 2024-08-28 DIAGNOSIS — I10 Essential (primary) hypertension: Secondary | ICD-10-CM | POA: Diagnosis not present

## 2024-08-28 DIAGNOSIS — R0789 Other chest pain: Secondary | ICD-10-CM

## 2024-08-28 MED ORDER — ENALAPRIL MALEATE 10 MG PO TABS: 10.0000 mg | ORAL_TABLET | Freq: Every day | ORAL | 3 refills | Status: AC

## 2024-08-28 NOTE — Progress Notes (Signed)
 BP 98/68   Pulse 92   Ht 5' 6 (1.676 m)   Wt 163 lb (73.9 kg)   LMP 02/29/2020   SpO2 93%   BMI 26.31 kg/m    Subjective:   Patient ID: Doris Newman, female    DOB: June 01, 1971, 53 y.o.   MRN: [id number]  HPI: Doris Newman is a 53 y.o. female presenting on 08/28/2024 for Medical Management of Chronic Issues and Hypertension   Discussed the use of AI scribe software for clinical note transcription with the patient, who gave verbal consent to proceed.  History of Present Illness   Doris Newman is a 53 year old female with hypertension and breast cancer who presents with chest pain.  Chest pain and associated symptoms - Chest pain localized to the left side, described as soreness - Pain primarily occurs at work during periods of stress and physical activity - No lightheadedness or dizziness upon standing - Visual disturbance described as seeing 'stars' while in the bathtub earlier today, a new symptom - No shortness of breath, cough, or congestion  Breast cancer management - Continues anastrozole  therapy for breast cancer - Transitioning to a new female oncologist after previous oncologist left - Recent mammogram was normal - Missed scheduled bone density test this morning due to feeling unwell; rescheduled for next week  Gastrointestinal symptoms - No recent heartburn symptoms while taking omeprazole  - Regular bowel movements with need to strain during defecation - Recent weight loss  Musculoskeletal symptoms - Tenderness in the middle of the back  Cardiovascular risk factors - Hypertension managed with enalapril  20 mg - Hyperlipidemia managed with Crestor  - Presence of varicose veins, attributed to family history          Relevant past medical, surgical, family and social history reviewed and updated as indicated. Interim medical history since our last visit reviewed. Allergies and medications reviewed and updated.  Review of Systems   Constitutional:  Negative for chills and fever.  HENT:  Negative for congestion, ear discharge and ear pain.   Eyes:  Negative for redness and visual disturbance.  Respiratory:  Negative for chest tightness and shortness of breath.   Cardiovascular:  Positive for chest pain. Negative for leg swelling.  Genitourinary:  Negative for difficulty urinating and dysuria.  Musculoskeletal:  Negative for back pain and gait problem.  Skin:  Negative for rash.  Neurological:  Negative for dizziness, light-headedness and headaches.  Psychiatric/Behavioral:  Negative for agitation and behavioral problems.   All other systems reviewed and are negative.   Per HPI unless specifically indicated above   Allergies as of 08/28/2024   No Known Allergies      Medication List        Accurate as of August 28, 2024  3:52 PM. If you have any questions, ask your nurse or doctor.          ALPRAZolam 1 MG tablet Commonly known as: XANAX Take 1 mg by mouth 4 (four) times daily.   anastrozole  1 MG tablet Commonly known as: ARIMIDEX  Take 1 tablet (1 mg total) by mouth daily.   benzonatate 200 MG capsule Commonly known as: TESSALON Take 200 mg by mouth 3 (three) times daily as needed.   enalapril  10 MG tablet Commonly known as: VASOTEC  Take 1 tablet (10 mg total) by mouth daily. What changed:  medication strength how much to take Changed by: F[facility] Girtie Wiersma   fluticasone  50 MCG/ACT nasal spray Commonly known as: FLONASE  Place 1 spray into both  nostrils 2 (two) times daily as needed for allergies or rhinitis.   furosemide  40 MG tablet Commonly known as: LASIX  Take 1 tablet (40 mg total) by mouth daily.   gabapentin  300 MG capsule Commonly known as: NEURONTIN  Take 2 capsules (600 mg total) by mouth at bedtime.   meloxicam  7.5 MG tablet Commonly known as: Mobic  Take 1 tablet (7.5 mg total) by mouth daily.   mirtazapine 15 MG tablet Commonly known as: REMERON Take 15 mg by  mouth at bedtime as needed (sleep).   naproxen  500 MG tablet Commonly known as: NAPROSYN  Take 1 tablet (500 mg total) by mouth 2 (two) times daily as needed.   omeprazole  20 MG capsule Commonly known as: PRILOSEC Take 1 capsule (20 mg total) by mouth daily.   ondansetron  4 MG disintegrating tablet Commonly known as: ZOFRAN -ODT Take 1 tablet (4 mg total) by mouth 3 (three) times daily as needed.   propranolol 20 MG tablet Commonly known as: INDERAL Take 20 mg by mouth daily.   rizatriptan  5 MG tablet Commonly known as: MAXALT  Take 1 tablet (5 mg total) by mouth as needed for migraine. May repeat in 2 hours if needed   rosuvastatin  5 MG tablet Commonly known as: CRESTOR  Take 1 tablet (5 mg total) by mouth daily.   tizanidine  2 MG capsule Commonly known as: ZANAFLEX  Take 1 capsule (2 mg total) by mouth at bedtime as needed for muscle spasms.   venlafaxine  XR 37.5 MG 24 hr capsule Commonly known as: EFFEXOR -XR Take 1 capsule (37.5 mg total) by mouth daily with breakfast. Take 1 capsule for 1 week, then take 2 capsules daily.   Veozah  45 MG Tabs Generic drug: Fezolinetant  Take 1 tablet (45 mg total) by mouth daily.         Objective:   BP 98/68   Pulse 92   Ht 5' 6 (1.676 m)   Wt 163 lb (73.9 kg)   LMP 02/29/2020   SpO2 93%   BMI 26.31 kg/m   Wt Readings from Last 3 Encounters:  08/28/24 163 lb (73.9 kg)  04/17/24 172 lb 3.2 oz (78.1 kg)  01/24/24 180 lb (81.6 kg)    Physical Exam Vitals and nursing note reviewed.  Constitutional:      General: She is not in acute distress.    Appearance: Normal appearance. She is well-developed. She is not diaphoretic.  Eyes:     Conjunctiva/sclera: Conjunctivae normal.  Cardiovascular:     Rate and Rhythm: Normal rate and regular rhythm.     Heart sounds: Normal heart sounds. No murmur heard. Pulmonary:     Effort: Pulmonary effort is normal. No respiratory distress.     Breath sounds: Normal breath sounds. No  wheezing.  Chest:     Chest wall: Tenderness (Chest wall tenderness on left side, reproducible) present.  Musculoskeletal:        General: No tenderness. Normal range of motion.  Skin:    General: Skin is warm and dry.     Findings: No rash.  Neurological:     Mental Status: She is alert and oriented to person, place, and time.     Coordination: Coordination normal.  Psychiatric:        Behavior: Behavior normal.       VITALS: BP- 98/68 CHEST: Lungs clear to auscultation bilaterally. CARDIOVASCULAR: Heart regular rate and rhythm, no murmurs. ABDOMEN: Abdomen slightly tender in upper region. EXTREMITIES: Varicose veins noted on foot.  Assessment & Plan:   Problem List Items Addressed This Visit       Cardiovascular and Mediastinum   Essential hypertension - Primary   Relevant Medications   enalapril  (VASOTEC ) 10 MG tablet   Other Relevant Orders   CBC With Diff/Platelet   CMP14+EGFR   Lipid panel     Digestive   Gastroesophageal reflux disease without esophagitis   Relevant Orders   CBC With Diff/Platelet   CMP14+EGFR   Lipid panel     Other   Hyperlipidemia   Relevant Medications   enalapril  (VASOTEC ) 10 MG tablet   Other Relevant Orders   CBC With Diff/Platelet   CMP14+EGFR   Lipid panel   Other Visit Diagnoses       Muscular chest pain              Essential hypertension Blood pressure low at 98/68 mmHg without symptoms. Previously low at oncologist visit. - Reduced losartan to 10 mg daily.  Breast cancer on active hormonal therapy Continues anastrozole . Normal mammogram. Chest pain likely musculoskeletal, not cardiac. - Continue anastrozole . - Monitor chest pain and report changes.  Constipation Left lower quadrant soreness likely due to constipation. Straining suggests possible fecal impaction. - Recommended Miralax or laxative for a few days.  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  Pure  hypercholesterolemia Continues on Crestor . - Continue Crestor .  Gastroesophageal reflux disease (GERD) Continues on omeprazole . No recent heartburn. - Continue omeprazole .          Follow up plan: Return in about 6 months (around 02/25/2025), or if symptoms worsen or fail to improve, for Physical exam and recheck hypertension and cholesterol.  Counseling provided for all of the vaccine components Orders Placed This Encounter  Procedures   CBC With Diff/Platelet   CMP14+EGFR   Lipid panel    Fonda Levins, MD [facility] 08/28/2024, 3:52 PM

## 2024-08-29 LAB — CMP14+EGFR
ALT: 29 IU/L (ref 0–32)
AST: 28 IU/L (ref 0–40)
Albumin: 4.7 g/dL (ref 3.8–4.9)
Alkaline Phosphatase: 124 IU/L (ref 49–135)
BUN/Creatinine Ratio: 12 (ref 9–23)
BUN: 11 mg/dL (ref 6–24)
Bilirubin Total: 0.5 mg/dL (ref 0.0–1.2)
CO2: 25 mmol/L (ref 20–29)
Calcium: 9.7 mg/dL (ref 8.7–10.2)
Chloride: 98 mmol/L (ref 96–106)
Creatinine, Ser: 0.95 mg/dL (ref 0.57–1.00)
Globulin, Total: 2.2 g/dL (ref 1.5–4.5)
Glucose: 96 mg/dL (ref 70–99)
Potassium: 4.3 mmol/L (ref 3.5–5.2)
Sodium: 137 mmol/L (ref 134–144)
Total Protein: 6.9 g/dL (ref 6.0–8.5)
eGFR: 72 mL/min/1.73 (ref >59)

## 2024-08-29 LAB — CBC WITH DIFF/PLATELET
Basophils Absolute: 0 x10E3/uL (ref 0.0–0.2)
Basos: 1 %
EOS (ABSOLUTE): 0.1 x10E3/uL (ref 0.0–0.4)
Eos: 2 %
Hematocrit: 40.2 % (ref 34.0–46.6)
Hemoglobin: 13.4 g/dL (ref 11.1–15.9)
Immature Grans (Abs): 0 x10E3/uL (ref 0.0–0.1)
Immature Granulocytes: 0 %
Lymphocytes Absolute: 1.5 x10E3/uL (ref 0.7–3.1)
Lymphs: 25 %
MCH: 30.8 pg (ref 26.6–33.0)
MCHC: 33.3 g/dL (ref 31.5–35.7)
MCV: 92 fL (ref 79–97)
Monocytes Absolute: 0.5 x10E3/uL (ref 0.1–0.9)
Monocytes: 8 %
Neutrophils Absolute: 3.9 x10E3/uL (ref 1.4–7.0)
Neutrophils: 64 %
Platelets: 335 x10E3/uL (ref 150–450)
RBC: 4.35 x10E6/uL (ref 3.77–5.28)
RDW: 13.3 % (ref 11.7–15.4)
WBC: 6.2 x10E3/uL (ref 3.4–10.8)

## 2024-08-29 LAB — LIPID PANEL
Chol/HDL Ratio: 2.6 ratio (ref 0.0–4.4)
Cholesterol, Total: 187 mg/dL (ref 100–199)
HDL: 71 mg/dL (ref >39)
LDL Chol Calc (NIH): 105 mg/dL — ABNORMAL HIGH (ref 0–99)
Triglycerides: 59 mg/dL (ref 0–149)
VLDL Cholesterol Cal: 11 mg/dL (ref 5–40)

## 2024-09-01 ENCOUNTER — Other Ambulatory Visit: Payer: Self-pay | Admitting: *Deleted

## 2024-09-01 DIAGNOSIS — C50912 Malignant neoplasm of unspecified site of left female breast: Secondary | ICD-10-CM

## 2024-09-01 DIAGNOSIS — R232 Flushing: Secondary | ICD-10-CM

## 2024-09-01 MED ORDER — VEOZAH 45 MG PO TABS: 45.0000 mg | ORAL_TABLET | Freq: Every day | ORAL | 3 refills | Status: AC

## 2024-09-01 MED ORDER — ANASTROZOLE 1 MG PO TABS: 1.0000 mg | ORAL_TABLET | Freq: Every day | ORAL | 2 refills | Status: AC

## 2024-09-02 ENCOUNTER — Inpatient Hospital Stay: Attending: Hematology

## 2024-09-02 ENCOUNTER — Other Ambulatory Visit (HOSPITAL_COMMUNITY)

## 2024-09-02 VITALS — BP 137/98 | HR 100 | Temp 97.7°F | Resp 18 | Wt 164.8 lb

## 2024-09-02 DIAGNOSIS — Z79818 Long term (current) use of other agents affecting estrogen receptors and estrogen levels: Secondary | ICD-10-CM | POA: Diagnosis not present

## 2024-09-02 DIAGNOSIS — C50912 Malignant neoplasm of unspecified site of left female breast: Secondary | ICD-10-CM | POA: Insufficient documentation

## 2024-09-02 DIAGNOSIS — Z170 Estrogen receptor positive status [ER+]: Secondary | ICD-10-CM | POA: Diagnosis not present

## 2024-09-02 MED ORDER — LEUPROLIDE ACETATE (3 MONTH) 11.25 MG IM KIT
11.2500 mg | PACK | Freq: Once | INTRAMUSCULAR | Status: AC
  Administered 2024-09-02: 11.25 mg via INTRAMUSCULAR
  Filled 2024-09-02: qty 11.25

## 2024-09-02 NOTE — Patient Instructions (Signed)
 Leuprolide Solution for Injection What is this medication? LEUPROLIDE (loo PROE lide) reduces the symptoms of prostate cancer. It works by decreasing levels of the hormone testosterone in the body. This prevents prostate cancer cells from spreading or growing. This medicine may be used for other purposes; ask your health care provider or pharmacist if you have questions. COMMON BRAND NAME(S): Lupron What should I tell my care team before I take this medication? They need to know if you have any of these conditions: Diabetes Heart attack Heart disease High blood pressure High cholesterol Pain or difficulty passing urine Spinal cord metastasis Stroke Tobacco use An unusual or allergic reaction to leuprolide, other medications, foods, dyes, or preservatives Pregnant or trying to get pregnant Breast-feeding How should I use this medication? This medication is for injection under the skin or into a muscle. You will be taught how to prepare and give this medication. Use exactly as directed. Take your medication at regular intervals. Do not take it more often than directed. It is important that you put your used needles and syringes in a special sharps container. Do not put them in a trash can. If you do not have a sharps container, call your care team to get one. A special MedGuide will be given to you by the pharmacist with each prescription and refill. Be sure to read this information carefully each time. Talk to your care team about the use of this medication in children. While this medication may be prescribed for children as young as 8 years for selected conditions, precautions do apply. Overdosage: If you think you have taken too much of this medicine contact a poison control center or emergency room at once. NOTE: This medicine is only for you. Do not share this medicine with others. What if I miss a dose? If you miss a dose, take it as soon as you can. If it is almost time for your next  dose, take only that dose. Do not take double or extra doses. What may interact with this medication? Do not take this medication with any of the following: Chasteberry Cisapride Dronedarone Pimozide Thioridazine This medication may also interact with the following: Estrogen or progestin hormones Herbal or dietary supplements, like black cohosh or DHEA Other medications that cause heart rhythm changes Testosterone This list may not describe all possible interactions. Give your health care provider a list of all the medicines, herbs, non-prescription drugs, or dietary supplements you use. Also tell them if you smoke, drink alcohol, or use illegal drugs. Some items may interact with your medicine. What should I watch for while using this medication? Visit your care team for regular checks on your progress. During the first week, your symptoms may get worse, but then will improve as you continue your treatment. You may get hot flashes, increased bone pain, increased difficulty passing urine, or an aggravation of nerve symptoms. Discuss these effects with your care team, some of them may improve with continued use of this medication. Patients may experience a menstrual cycle or spotting during the first 2 months of therapy with this medication. If this continues, contact your care team. This medication may increase blood sugar. The risk may be higher in patients who already have diabetes. Ask your care team what you can do to lower your risk of diabetes while taking this medication. What side effects may I notice from receiving this medication? Side effects that you should report to your care team as soon as possible: Allergic reactions--skin rash,  itching, hives, swelling of the face, lips, tongue, or throat Heart attack--pain or tightness in the chest, shoulders, arms, or jaw, nausea, shortness of breath, cold or clammy skin, feeling faint or lightheaded Heart rhythm changes--fast or irregular  heartbeat, dizziness, feeling faint or lightheaded, chest pain, trouble breathing High blood sugar (hyperglycemia)--increased thirst or amount of urine, unusual weakness or fatigue, blurry vision New or worsening seizures Redness, blistering, peeling, or loosening of the skin, including inside the mouth Stroke--sudden numbness or weakness of the face, arm, or leg, trouble speaking, confusion, trouble walking, loss of balance or coordination, dizziness, severe headache, change in vision Swelling and pain of the tumor site or lymph nodes Side effects that usually do not require medical attention (report these to your care team if they continue or are bothersome): Change in sex drive or performance Hot flashes Joint pain Pain, redness, or irritation at injection site Swelling of the ankles, hands, or feet Unusual weakness or fatigue This list may not describe all possible side effects. Call your doctor for medical advice about side effects. You may report side effects to FDA at 1-800-FDA-1088. Where should I keep my medication? Keep out of the reach of children and pets. Store below 25 degrees C (77 degrees F). Do not freeze. Protect from light. Get rid of any unused medication after the expiration date. To get rid of medications that are no longer needed or have expired: Take the medication to a medication take-back program. Check with your pharmacy or law enforcement to find a location. If you cannot return the medication, ask your pharmacist or care team how to get rid of this medication safely. NOTE: This sheet is a summary. It may not cover all possible information. If you have questions about this medicine, talk to your doctor, pharmacist, or health care provider.  2024 [employer]vier/Gold Standard (2023-09-14 00:00:00)

## 2024-09-02 NOTE — Progress Notes (Signed)
 Patient tolerated Lupron  injection with no complaints voiced.  Site clean and dry with no bruising or swelling noted at site.  See MAR for details.  Band aid applied.  Patient stable during and after injection.  Vss with discharge and left in satisfactory condition with no s/s of distress noted. All follow ups as scheduled.   Doris Newman

## 2024-09-04 ENCOUNTER — Ambulatory Visit: Payer: Self-pay | Admitting: Family Medicine

## 2024-09-05 ENCOUNTER — Telehealth: Payer: Self-pay | Admitting: Family Medicine

## 2024-09-05 NOTE — Telephone Encounter (Signed)
 Reviewed results with patient and she voiced understanding.

## 2024-09-15 ENCOUNTER — Other Ambulatory Visit (HOSPITAL_COMMUNITY)

## 2024-09-16 ENCOUNTER — Other Ambulatory Visit (HOSPITAL_COMMUNITY)

## 2024-09-25 ENCOUNTER — Other Ambulatory Visit (HOSPITAL_COMMUNITY)

## 2024-10-06 ENCOUNTER — Ambulatory Visit (HOSPITAL_COMMUNITY)
Admission: RE | Admit: 2024-10-06 | Discharge: 2024-10-06 | Disposition: A | Discharge status: Home / Self Care | Source: Ambulatory Visit | Attending: Hematology | Admitting: Hematology

## 2024-10-06 DIAGNOSIS — C50912 Malignant neoplasm of unspecified site of left female breast: Secondary | ICD-10-CM | POA: Insufficient documentation

## 2024-10-06 DIAGNOSIS — Z79899 Other long term (current) drug therapy: Secondary | ICD-10-CM | POA: Insufficient documentation

## 2024-10-06 DIAGNOSIS — Z780 Asymptomatic menopausal state: Secondary | ICD-10-CM | POA: Diagnosis not present

## 2024-10-13 ENCOUNTER — Encounter: Payer: Self-pay | Admitting: *Deleted

## 2024-10-15 ENCOUNTER — Other Ambulatory Visit: Payer: Self-pay | Admitting: *Deleted

## 2024-10-15 DIAGNOSIS — C50912 Malignant neoplasm of unspecified site of left female breast: Secondary | ICD-10-CM

## 2024-10-17 ENCOUNTER — Inpatient Hospital Stay

## 2024-10-17 DIAGNOSIS — C50912 Malignant neoplasm of unspecified site of left female breast: Secondary | ICD-10-CM

## 2024-10-18 ENCOUNTER — Encounter (HOSPITAL_COMMUNITY): Payer: Self-pay | Admitting: Oncology

## 2024-10-21 ENCOUNTER — Inpatient Hospital Stay: Attending: Hematology

## 2024-10-21 DIAGNOSIS — C50912 Malignant neoplasm of unspecified site of left female breast: Secondary | ICD-10-CM

## 2024-10-21 LAB — CBC WITH DIFFERENTIAL/PLATELET
Abs Immature Granulocytes: 0 K/uL (ref 0.00–0.07)
Basophils Absolute: 0 K/uL (ref 0.0–0.1)
Basophils Relative: 0 %
Eosinophils Absolute: 0 K/uL (ref 0.0–0.5)
Eosinophils Relative: 1 %
HCT: 36.1 % (ref 36.0–46.0)
Hemoglobin: 12.1 g/dL (ref 12.0–15.0)
Immature Granulocytes: 0 %
Lymphocytes Relative: 63 %
Lymphs Abs: 1.8 K/uL (ref 0.7–4.0)
MCH: 30.6 pg (ref 26.0–34.0)
MCHC: 33.5 g/dL (ref 30.0–36.0)
MCV: 91.4 fL (ref 80.0–100.0)
Monocytes Absolute: 0.2 K/uL (ref 0.1–1.0)
Monocytes Relative: 7 %
Neutro Abs: 0.8 K/uL — ABNORMAL LOW (ref 1.7–7.7)
Neutrophils Relative %: 29 %
Platelets: 218 K/uL (ref 150–400)
RBC: 3.95 MIL/uL (ref 3.87–5.11)
RDW: 13.4 % (ref 11.5–15.5)
Smear Review: NORMAL
WBC: 2.8 K/uL — ABNORMAL LOW (ref 4.0–10.5)
nRBC: 0 % (ref 0.0–0.2)

## 2024-10-21 LAB — COMPREHENSIVE METABOLIC PANEL WITH GFR
ALT: 18 U/L (ref 0–44)
AST: 23 U/L (ref 15–41)
Albumin: 4.2 g/dL (ref 3.5–5.0)
Alkaline Phosphatase: 85 U/L (ref 38–126)
Anion gap: 14 (ref 5–15)
BUN: 7 mg/dL (ref 6–20)
CO2: 27 mmol/L (ref 22–32)
Calcium: 9.4 mg/dL (ref 8.9–10.3)
Chloride: 102 mmol/L (ref 98–111)
Creatinine, Ser: 0.85 mg/dL (ref 0.44–1.00)
GFR, Estimated: >60 mL/min
Glucose, Bld: 107 mg/dL — ABNORMAL HIGH (ref 70–99)
Potassium: 3.3 mmol/L — ABNORMAL LOW (ref 3.5–5.1)
Sodium: 143 mmol/L (ref 135–145)
Total Bilirubin: 0.3 mg/dL (ref 0.0–1.2)
Total Protein: 6.7 g/dL (ref 6.5–8.1)

## 2024-10-21 LAB — VITAMIN D 25 HYDROXY (VIT D DEFICIENCY, FRACTURES): Vit D, 25-Hydroxy: 52.7 ng/mL (ref 30–100)

## 2024-10-24 ENCOUNTER — Inpatient Hospital Stay: Admitting: Oncology

## 2024-10-24 VITALS — BP 120/96 | HR 65 | Temp 98.2°F | Resp 16 | Wt 160.8 lb

## 2024-10-24 DIAGNOSIS — E876 Hypokalemia: Secondary | ICD-10-CM | POA: Diagnosis not present

## 2024-10-24 DIAGNOSIS — D708 Other neutropenia: Secondary | ICD-10-CM

## 2024-10-24 DIAGNOSIS — C50912 Malignant neoplasm of unspecified site of left female breast: Secondary | ICD-10-CM | POA: Diagnosis not present

## 2024-10-24 DIAGNOSIS — R232 Flushing: Secondary | ICD-10-CM

## 2024-10-24 NOTE — Progress Notes (Signed)
 " Patient Care Team: [name], Fonda LABOR, MD as PCP - General (Family Medicine) Wilson, Diane G, RN as Oncology Nurse Navigator (Oncology)  Clinic Day:  10/24/2024  Referring physician: [name], Fonda LABOR, MD   CHIEF COMPLAINT:  CC: Stage I left breast IDC   Doris Newman 54 y.o. female was transferred to my care after her prior physician has left.   ASSESSMENT & PLAN:   Assessment & Plan: Doris Newman  is a 54 y.o. 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 Will complete 5 years in 05/2025 -BCI score not indicative for extended endocrine therapy  -Mammogram from 03/2024: Benign. Repeat in 1 year -12/25: DEXA scan: Normal BMD. -Continue vitamin D  and calcium  supplementation  Return to clinic in 3 months  Hot flashes - Continue Effexor  75 mg daily. - Continue Veozah  45 mg daily which is helping.    Leukopenia Mild, transient leukopenia likely secondary to recent upper respiratory infection. Asymptomatic and not chronic. - Advised to monitor for fever, chills, or night sweats and to present to the emergency department if these occur. - Planned to repeat laboratory studies in one month to monitor leukocyte count.  Hypokalemia Mild, asymptomatic hypokalemia (potassium 3.3 mmol/L) without acute symptoms or complications. - Advised to increase dietary potassium intake (bananas, broccoli, coconut water).  The patient understands the plans discussed today and is in agreement with them.  She knows to contact our office if she develops concerns prior to her next appointment.  40 minutes of total time was spent for this patient encounter, including preparation,review of records,  face-to-face counseling with the patient  and coordination of care, physical exam, and documentation of the encounter.    Doris Newman,acting as a neurosurgeon for Mickiel Dry, MD.,have documented all relevant documentation on the behalf of Mickiel Dry, MD,as directed by  Mickiel Dry, MD while in the presence of Mickiel Dry, MD.  I, Mickiel Dry MD, have reviewed the above documentation for accuracy and completeness, and I agree with the above.    Mickiel Dry, MD  [facility] - A DEPT OF [facility] [address] [place] [address] Dept: [phone] Dept Fax: [phone]   No orders of the defined types were placed in this encounter.    ONCOLOGY HISTORY:   I have reviewed her chart and materials related to her cancer extensively and collaborated history with the patient. Summary of oncologic history is as follows:   Diagnosis: Stage I left breast IDC   -01/14/2020: Bilateral screening mammogram: Possible masses in the right breast. Possible masses in the left breast.  -02/04/2020: Bilateral Diagnostic Mammogram and US : Highly suspicious 2.1 cm mass within the upper-outer left breast. Benign intraparenchymal lymph nodes within both upper outer breasts, corresponding to the other screening study findings.  -02/04/2020: Left breast mass biopsy.  Pathology: Invasive ductal carcinoma, grade 2, 0.8 cm in maximal extent. Tumor cells are ER positive (95%), PR positive (70%), and Her2 negative (1+) by IHC. Ki-67 is 10%.  -03/08/2020: Left lumpectomy and SNLB.  Pathology: Invasive ductal carcinoma, grade I/III, spanning 2.1 cm. 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 No evidence of carcinoma in 3 of 3 lymph nodes (0/3). Staging:  pT2, pN0  -03/08/2020: Oncotype DX: 16 -04/2020 - 05/2020: Radiation to left breast -05/31/2020 - current: Lupron  and anastrozole  -02/2021 -  current: Mammograms showing benign findings.  -05/23/2024: BCI : 4.1%. No indication for extended endocrine therapy.  Current Treatment:  Anastrazole and lupron   INTERVAL HISTORY:   Discussed the use of AI scribe software for clinical note transcription with the patient, who gave verbal consent to proceed.  History of Present Illness Doris Newman is a 54 year old female with breast cancer on adjuvant anastrozole  therapy who presents for routine oncology follow-up.  She is nearing five years of adjuvant anastrozole  therapy. She has no new complaints and reports feeling well overall. Her last mammogram in June was normal, and a recent bone density scan was normal. She continues vitamin D  and calcium  supplementation.  She experiences frequent vasomotor symptoms, describing hot flashes as occurring frequently. She takes Veozah  for symptom control, which she finds effective after initial difficulty obtaining insurance approval.  She is aware of recent laboratory findings of mild leukopenia and hypokalemia. She denies fever, chills, night sweats, or diarrhea. She had a recent episode of cough and chest congestion, now resolved, and has received a flu vaccination.   I have reviewed the past medical history, past surgical history, social history and family history with the patient and they are unchanged from previous note.  ALLERGIES:  has no known allergies.  MEDICATIONS:  Current Outpatient Medications  Medication Sig Dispense Refill   ALPRAZolam (XANAX) 1 MG tablet Take 1 mg by mouth 4 (four) times daily.     anastrozole  (ARIMIDEX ) 1 MG tablet Take 1 tablet (1 mg total) by mouth daily. 90 tablet 2   benzonatate (TESSALON) 200 MG capsule Take 200 mg by mouth 3 (three) times daily as needed.     enalapril  (VASOTEC ) 10 MG tablet Take 1 tablet (10 mg total) by mouth daily. 90 tablet 3   Fezolinetant  (VEOZAH ) 45 MG TABS Take 1 tablet (45 mg total) by mouth daily. 90 tablet 3   fluticasone   (FLONASE ) 50 MCG/ACT nasal spray Place 1 spray into both nostrils 2 (two) times daily as needed for allergies or rhinitis. 48 g 1   furosemide  (LASIX ) 40 MG tablet Take 1 tablet (40 mg total) by mouth daily. 90 tablet 3   gabapentin  (NEURONTIN ) 300 MG capsule Take 2 capsules (600 mg total) by mouth at bedtime. 60 capsule 6   meloxicam  (MOBIC ) 7.5 MG tablet Take 1 tablet (7.5 mg total) by mouth daily. 90 tablet 3   mirtazapine (REMERON) 15 MG tablet Take 15 mg by mouth at bedtime as needed (sleep).      naproxen  (NAPROSYN ) 500 MG tablet Take 1 tablet (500 mg total) by mouth 2 (two) times daily as needed. 30 tablet 0   omeprazole  (PRILOSEC) 20 MG capsule Take 1 capsule (20 mg total) by mouth daily. 90 capsule 3   ondansetron  (ZOFRAN -ODT) 4 MG disintegrating tablet Take 1 tablet (4 mg total) by mouth 3 (three) times daily as needed. 20 tablet 0   propranolol (INDERAL) 20 MG tablet Take 20 mg by mouth daily.      rizatriptan  (MAXALT ) 5 MG tablet Take 1 tablet (5 mg total) by mouth as needed for migraine. May repeat in 2 hours if needed 10 tablet 5   rosuvastatin  (CRESTOR ) 5 MG tablet Take 1 tablet (5 mg total) by mouth daily. 90 tablet 3   tizanidine  (ZANAFLEX ) 2 MG capsule Take 1 capsule (2 mg total) by mouth at bedtime as  needed for muscle spasms. 30 capsule 2   venlafaxine  XR (EFFEXOR -XR) 150 MG 24 hr capsule Take 150 mg by mouth daily as needed.     No current facility-administered medications for this visit.   Facility-Administered Medications Ordered in Other Visits  Medication Dose Route Frequency Provider Last Rate Last Admin   leuprolide  (LUPRON ) injection 11.25 mg  11.25 mg Intramuscular Once Katragadda, Sreedhar, MD        VITALS:  Blood pressure (!) 120/96, pulse 65, temperature 98.2 F (36.8 C), temperature source Oral, resp. rate 16, weight 160 lb 12.8 oz (72.9 kg), last menstrual period 02/29/2020, SpO2 98%.  Wt Readings from Last 3 Encounters:  10/24/24 160 lb 12.8 oz (72.9 kg)   09/02/24 164 lb 12.8 oz (74.8 kg)  08/28/24 163 lb (73.9 kg)    Body mass index is 25.95 kg/m.  Performance status (ECOG): 0 - Asymptomatic  PHYSICAL EXAM:   GENERAL:alert, no distress and comfortable SKIN: skin color, texture, turgor are normal, no rashes or significant lesions BREAST: Right breast: normal exam, Left breast: Lumpectomy scarring, no lumps palpated.  LYMPH:  no palpable lymphadenopathy in the cervical, axillary or inguinal LUNGS: clear to auscultation and percussion with normal breathing effort HEART: regular rate & rhythm and no murmurs and no lower extremity edema ABDOMEN:abdomen soft, non-tender and normal bowel sounds Musculoskeletal:no cyanosis of digits and no clubbing  NEURO: alert & oriented x 3 with fluent speech  LABORATORY DATA:  I have reviewed the data as listed  No results found for: SPEP, UPEP  Lab Results  Component Value Date   WBC 2.8 (L) 10/21/2024   NEUTROABS 0.8 (L) 10/21/2024   HGB 12.1 10/21/2024   HCT 36.1 10/21/2024   MCV 91.4 10/21/2024   PLT 218 10/21/2024    @LASTCHEMISTRY @   RADIOGRAPHIC STUDIES: I have personally reviewed the radiological images as listed and agreed with the findings in the report. DG Bone Density EXAM: DUAL X-RAY ABSORPTIOMETRY (DXA) FOR BONE MINERAL DENSITY 10/06/2024 1:24 pm  CLINICAL DATA:  54 year old Female Postmenopausal. Antiestrogen therapy  TECHNIQUE: An axial (e.g., hips, spine) and/or appendicular (e.g., radius) exam was performed, as appropriate, using [facility]. Images are obtained for bone mineral density measurement and are not obtained for diagnostic purposes. [id number]  Exclusions: L3-L4.  COMPARISON:  None.  FINDINGS: Scan quality: Good.  LUMBAR SPINE (L1-L2):  BMD (in g/cm2): 1.177  T-score: 0.1  Z-score: 0.8  Rate of change from previous exam: -4.9 %  LEFT FEMORAL NECK:  BMD (in g/cm2): 0.896  T-score:  -1.0  Z-score: -0.1  LEFT TOTAL HIP:  BMD (in g/cm2): 0.916  T-score: -0.7  Z-score: -0.1  RIGHT FEMORAL NECK:  BMD (in g/cm2): 0.942  T-score: -0.7  Z-score: 0.3  RIGHT TOTAL HIP:  BMD (in g/cm2): 0.934  T-score: -0.6  Z-score: 0.0  DUAL-FEMUR TOTAL MEAN:  Rate of change from previous exam: -3.6 %  FRAX 10-YEAR PROBABILITY OF FRACTURE:  FRAX not reported as the lowest BMD is not in the osteopenia range.  IMPRESSION: Normal based on BMD.  Fracture risk is not increased.  RECOMMENDATIONS: 1. All patients should optimize calcium  and vitamin D  intake.  2. Consider FDA-approved medical therapies in postmenopausal women and men aged 74 years and older, based on the following:  - A hip or vertebral (clinical or morphometric) fracture  - T-score less than or equal to -2.5 and secondary causes have been excluded.  - Low bone mass (  T-score between -1.0 and -2.5) and a 10-year probability of a hip fracture greater than or equal to 3% or a 10-year probability of a major osteoporosis-related fracture greater than or equal to 20% based on the [place] -adapted WHO algorithm.  - Clinician judgment and/or patient preferences may indicate treatment for people with 10-year fracture probabilities above or below these levels  3. Patients with diagnosis of osteoporosis or at high risk for fracture should have regular bone mineral density tests. For patients eligible for Medicare, routine testing is allowed once every 2 years. The testing frequency can be increased to one year for patients who have rapidly progressing disease, those who are receiving or discontinuing medical therapy to restore bone mass, or have additional risk factors.  Electronically Signed   By: Harrietta Sherry M.D.   On: 10/06/2024 13:46    "

## 2024-11-24 ENCOUNTER — Inpatient Hospital Stay

## 2024-12-04 ENCOUNTER — Inpatient Hospital Stay: Attending: Hematology

## 2024-12-04 ENCOUNTER — Inpatient Hospital Stay

## 2025-02-05 ENCOUNTER — Inpatient Hospital Stay

## 2025-02-12 ENCOUNTER — Inpatient Hospital Stay: Attending: Hematology | Admitting: Oncology

## 2025-02-25 ENCOUNTER — Encounter: Admitting: Family Medicine
